# Patient Record
Sex: Female | Born: 1945 | Race: White | Hispanic: No | Marital: Single | Smoking: Never smoker
Health system: Southern US, Community
[De-identification: ages and names within clinical notes are randomized; demographics above are authoritative.]

## PROBLEM LIST (undated history)

## (undated) DIAGNOSIS — F209 Schizophrenia, unspecified: Secondary | ICD-10-CM

## (undated) DIAGNOSIS — F0280 Dementia in other diseases classified elsewhere without behavioral disturbance: Secondary | ICD-10-CM

## (undated) DIAGNOSIS — B1920 Unspecified viral hepatitis C without hepatic coma: Secondary | ICD-10-CM

## (undated) DIAGNOSIS — K219 Gastro-esophageal reflux disease without esophagitis: Secondary | ICD-10-CM

## (undated) DIAGNOSIS — T8859XA Other complications of anesthesia, initial encounter: Secondary | ICD-10-CM

## (undated) DIAGNOSIS — F05 Delirium due to known physiological condition: Secondary | ICD-10-CM

## (undated) DIAGNOSIS — K759 Inflammatory liver disease, unspecified: Secondary | ICD-10-CM

## (undated) HISTORY — DX: Inflammatory liver disease, unspecified: K75.9

## (undated) HISTORY — PX: ELBOW SURGERY: SHX618

## (undated) HISTORY — DX: Schizophrenia, unspecified: F20.9

## (undated) HISTORY — DX: Dementia in other diseases classified elsewhere, unspecified severity, without behavioral disturbance, psychotic disturbance, mood disturbance, and anxiety: F02.80

## (undated) SURGERY — Surgical Case
Anesthesia: *Unknown

---

## 1999-11-01 DIAGNOSIS — K648 Other hemorrhoids: Secondary | ICD-10-CM | POA: Insufficient documentation

## 2006-04-24 ENCOUNTER — Ambulatory Visit (HOSPITAL_COMMUNITY): Admission: RE | Admit: 2006-04-24 | Discharge: 2006-04-24 | Payer: Self-pay | Admitting: Internal Medicine

## 2006-09-20 ENCOUNTER — Ambulatory Visit: Payer: Self-pay | Admitting: Internal Medicine

## 2006-10-06 ENCOUNTER — Ambulatory Visit: Payer: Self-pay | Admitting: Internal Medicine

## 2007-03-23 ENCOUNTER — Ambulatory Visit: Payer: Self-pay | Admitting: Internal Medicine

## 2007-05-09 ENCOUNTER — Encounter (INDEPENDENT_AMBULATORY_CARE_PROVIDER_SITE_OTHER): Payer: Self-pay | Admitting: *Deleted

## 2007-05-11 DIAGNOSIS — G4700 Insomnia, unspecified: Secondary | ICD-10-CM | POA: Insufficient documentation

## 2007-05-11 DIAGNOSIS — F4321 Adjustment disorder with depressed mood: Secondary | ICD-10-CM | POA: Insufficient documentation

## 2007-05-11 DIAGNOSIS — E162 Hypoglycemia, unspecified: Secondary | ICD-10-CM | POA: Insufficient documentation

## 2008-01-21 ENCOUNTER — Emergency Department (HOSPITAL_COMMUNITY): Admission: EM | Admit: 2008-01-21 | Discharge: 2008-01-21 | Payer: Self-pay | Admitting: Emergency Medicine

## 2008-01-21 ENCOUNTER — Telehealth (INDEPENDENT_AMBULATORY_CARE_PROVIDER_SITE_OTHER): Payer: Self-pay | Admitting: Internal Medicine

## 2008-02-01 ENCOUNTER — Ambulatory Visit: Payer: Self-pay | Admitting: Internal Medicine

## 2008-02-01 DIAGNOSIS — R233 Spontaneous ecchymoses: Secondary | ICD-10-CM

## 2008-02-01 DIAGNOSIS — R609 Edema, unspecified: Secondary | ICD-10-CM | POA: Insufficient documentation

## 2008-02-01 LAB — CONVERTED CEMR LAB
Bilirubin Urine: NEGATIVE
Blood in Urine, dipstick: NEGATIVE
Glucose, Urine, Semiquant: NEGATIVE
Ketones, urine, test strip: NEGATIVE
Nitrite: NEGATIVE
Protein, U semiquant: NEGATIVE
Specific Gravity, Urine: <1.005
Urobilinogen, UA: 0.2
WBC Urine, dipstick: NEGATIVE
pH: 7

## 2008-02-08 LAB — CONVERTED CEMR LAB
ALT: 39 units/L — ABNORMAL HIGH (ref 0–35)
ANA Titer 1: 1:40 {titer} — ABNORMAL HIGH
AST: 34 units/L (ref 0–37)
Albumin: 4 g/dL (ref 3.5–5.2)
Alkaline Phosphatase: 108 units/L (ref 39–117)
Anti Nuclear Antibody(ANA): POSITIVE — AB
BUN: 12 mg/dL (ref 6–23)
CO2: 28 meq/L (ref 19–32)
CRP: 0.3 mg/dL (ref <0.6)
Calcium: 9 mg/dL (ref 8.4–10.5)
Chloride: 105 meq/L (ref 96–112)
Creatinine, Ser: 0.63 mg/dL (ref 0.40–1.20)
Glucose, Bld: 88 mg/dL (ref 70–99)
HCV Quantitative: 263000 intl units/mL — ABNORMAL HIGH (ref <43)
Potassium: 4.2 meq/L (ref 3.5–5.3)
Sodium: 141 meq/L (ref 135–145)
Total Bilirubin: 0.6 mg/dL (ref 0.3–1.2)
Total Protein: 7.1 g/dL (ref 6.0–8.3)

## 2008-02-12 ENCOUNTER — Encounter (INDEPENDENT_AMBULATORY_CARE_PROVIDER_SITE_OTHER): Payer: Self-pay | Admitting: Internal Medicine

## 2008-02-12 LAB — CONVERTED CEMR LAB
Basophils Absolute: 0 10*3/uL (ref 0.0–0.1)
Basophils Relative: 0 % (ref 0–1)
Eosinophils Relative: 2 % (ref 0–5)
MCHC: 32.2 g/dL (ref 30.0–36.0)
Monocytes Relative: 7 % (ref 3–12)
Neutro Abs: 4 10*3/uL (ref 1.7–7.7)
Platelets: 169 10*3/uL (ref 150–400)
Prothrombin Time: 13.6 s (ref 11.6–15.2)
RBC: 4.52 M/uL (ref 3.87–5.11)
RDW: 15 % (ref 11.5–15.5)
Sed Rate: 12 mm/hr (ref 0–22)
aPTT: 45 s — ABNORMAL HIGH (ref 24–37)

## 2008-02-14 ENCOUNTER — Ambulatory Visit: Payer: Self-pay | Admitting: Internal Medicine

## 2008-02-14 DIAGNOSIS — B182 Chronic viral hepatitis C: Secondary | ICD-10-CM | POA: Insufficient documentation

## 2008-02-14 DIAGNOSIS — R799 Abnormal finding of blood chemistry, unspecified: Secondary | ICD-10-CM

## 2008-02-28 ENCOUNTER — Ambulatory Visit: Payer: Self-pay | Admitting: Internal Medicine

## 2008-03-10 ENCOUNTER — Encounter (INDEPENDENT_AMBULATORY_CARE_PROVIDER_SITE_OTHER): Payer: Self-pay | Admitting: Internal Medicine

## 2008-03-20 LAB — CONVERTED CEMR LAB
HCV Quantitative: 369000 intl units/mL — ABNORMAL HIGH (ref <43)
ds DNA Ab: <1 (ref <5)

## 2008-03-25 ENCOUNTER — Telehealth (INDEPENDENT_AMBULATORY_CARE_PROVIDER_SITE_OTHER): Payer: Self-pay | Admitting: Internal Medicine

## 2008-04-30 ENCOUNTER — Encounter (INDEPENDENT_AMBULATORY_CARE_PROVIDER_SITE_OTHER): Payer: Self-pay | Admitting: *Deleted

## 2008-08-13 ENCOUNTER — Emergency Department (HOSPITAL_COMMUNITY): Admission: EM | Admit: 2008-08-13 | Discharge: 2008-08-13 | Payer: Self-pay | Admitting: Emergency Medicine

## 2008-08-18 ENCOUNTER — Inpatient Hospital Stay (HOSPITAL_COMMUNITY): Admission: AD | Admit: 2008-08-18 | Discharge: 2008-08-19 | Payer: Self-pay | Admitting: Orthopedic Surgery

## 2009-03-19 ENCOUNTER — Telehealth (INDEPENDENT_AMBULATORY_CARE_PROVIDER_SITE_OTHER): Payer: Self-pay | Admitting: Internal Medicine

## 2009-03-19 ENCOUNTER — Ambulatory Visit: Payer: Self-pay | Admitting: Internal Medicine

## 2009-04-03 ENCOUNTER — Ambulatory Visit: Payer: Self-pay | Admitting: Internal Medicine

## 2009-04-17 ENCOUNTER — Ambulatory Visit: Payer: Self-pay | Admitting: Internal Medicine

## 2009-04-20 ENCOUNTER — Ambulatory Visit: Payer: Self-pay | Admitting: Internal Medicine

## 2009-05-06 ENCOUNTER — Ambulatory Visit: Payer: Self-pay | Admitting: Internal Medicine

## 2009-05-18 ENCOUNTER — Ambulatory Visit: Payer: Self-pay | Admitting: Internal Medicine

## 2009-06-23 ENCOUNTER — Encounter (INDEPENDENT_AMBULATORY_CARE_PROVIDER_SITE_OTHER): Payer: Self-pay | Admitting: Internal Medicine

## 2009-11-10 ENCOUNTER — Telehealth (INDEPENDENT_AMBULATORY_CARE_PROVIDER_SITE_OTHER): Payer: Self-pay | Admitting: *Deleted

## 2009-11-10 ENCOUNTER — Ambulatory Visit: Payer: Self-pay | Admitting: Internal Medicine

## 2009-11-10 DIAGNOSIS — R635 Abnormal weight gain: Secondary | ICD-10-CM

## 2009-11-10 DIAGNOSIS — F329 Major depressive disorder, single episode, unspecified: Secondary | ICD-10-CM | POA: Insufficient documentation

## 2009-11-16 LAB — CONVERTED CEMR LAB
AFP-Tumor Marker: 11.5 ng/mL — ABNORMAL HIGH (ref 0.0–8.0)
BUN: 17 mg/dL (ref 6–23)
Cholesterol: 161 mg/dL (ref 0–200)
Creatinine, Ser: 0.65 mg/dL (ref 0.40–1.20)
HDL: 59 mg/dL (ref >39)
LDL Cholesterol: 83 mg/dL (ref 0–99)
Potassium: 3.7 meq/L (ref 3.5–5.3)
Triglycerides: 97 mg/dL (ref <150)
VLDL: 19 mg/dL (ref 0–40)

## 2009-11-20 ENCOUNTER — Ambulatory Visit (HOSPITAL_COMMUNITY): Admission: RE | Admit: 2009-11-20 | Discharge: 2009-11-20 | Payer: Self-pay | Admitting: Internal Medicine

## 2009-11-23 ENCOUNTER — Encounter (INDEPENDENT_AMBULATORY_CARE_PROVIDER_SITE_OTHER): Payer: Self-pay | Admitting: Internal Medicine

## 2009-12-02 ENCOUNTER — Encounter (INDEPENDENT_AMBULATORY_CARE_PROVIDER_SITE_OTHER): Payer: Self-pay | Admitting: *Deleted

## 2009-12-05 DIAGNOSIS — K8020 Calculus of gallbladder without cholecystitis without obstruction: Secondary | ICD-10-CM | POA: Insufficient documentation

## 2009-12-07 ENCOUNTER — Encounter (INDEPENDENT_AMBULATORY_CARE_PROVIDER_SITE_OTHER): Payer: Self-pay | Admitting: Internal Medicine

## 2010-09-21 NOTE — Letter (Signed)
Summary: RECEIVED RECORDS FROM [facility]  RECEIVED RECORDS FROM [facility]   Imported By: Silvio Pate Stanislawscyk 01/21/2010 12:13:07  _____________________________________________________________________  External Attachment:    Type:   Image     Comment:   External Document

## 2010-09-21 NOTE — Letter (Signed)
Summary: *HSN Results Follow up  [facility]  [address] [place], [place] [address]   Phone: [phone]  Fax: [phone]      12/02/2009   Elizabeth Tucker [address] [place], [place]  [address]   Dear  [name],                            ____S.Drinkard,FNP   ____D. Gore,FNP       ____B. McPherson,MD   ____V. Rankins,MD    __xx__E. Mulberry,MD    ____N. Daphine Deutscher, FNP  ____D. Reche Dixon, MD    ____K. Philipp Deputy, MD    ____Other     This letter is to inform you that your recent test(s):  _______Pap Smear    _______Lab Test     ____xx___X-ray    ___xx____ is within acceptable limits  _______ requires a medication change  _______ requires a follow-up lab visit  _______ requires a follow-up visit with your provider   Comments:  Please call and let us know when your last colonoscopy was.  Thank you.       _________________________________________________________ If you have any questions, please contact our office                     Sincerely,  Vesta Mixer CMA [facility]

## 2010-09-21 NOTE — Letter (Signed)
Summary: *Referral Letter  [facility]  [address] [place], [place] [address]   Phone: [phone]  Fax: [phone]    11/10/2009  Thank you in advance for agreeing to see my patient:  Elizabeth Tucker [address] 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  Underwent needle liver biopsy 07/18/00 with findings of chronic hepatitis with moderate activity.  Will send copy of pathology.  Initiated on pegylated interferon and ribavirin 10/23/2000.  Dose decreased from what I can tell secondary to some depression and nausea and vomiting on 01/21/2001.  Pt. was on Paxil at the time.  Her viral load was undetectable at 12 weeks.  Treatment stopped end of 01/2001 as pt. lost her insurance and could not afford to come in for regular follow up/labs.  After treatment, pt. recognized how much better she felt off treatment. When pt. established here, she was under the impression that she had received full treatment and the treatment was stopped secondary to her undetectable viral level.  I was unable to sway her from this belief until recently receiving her old records.  HCV viral load last checked 02/28/08 with 369,000 copies. HIV at that time also negative. Interestingly, her GI records show she also has a hx of gastric metaplasia noted on EGD just before the Hep C diagnosis.  I will set her up with GI for that as well.   AFP, FLP, BMET pending from today.  I did not get her Hep A and B status today either--will check back with her on that. She will need psychiatric evaluation if previous treatment recommended.  I am trying to contact her counselor to see if she has a particular psychiatric diagnosis currently--she is taking Seroquel, but much of this from the patient's standpoint, is for sleep.  I was concerned for possible bipolar disorder, but pt. states her counselor does not feel she carries this  diagnosis.  Procedures Requested:   Current Medical Problems: 1)  INTERNAL HEMORRHOIDS (ICD-455.0) 2)  GASTRIC METAPLASIA (ICD-537.89) 3)  DEPRESSION (ICD-311) 4)  WEIGHT GAIN, ABNORMAL (ICD-783.1) 5)  HEPATITIS C, CHRONIC (ICD-070.54) 6)  ANA POSITIVE (ICD-790.99) 7)  ANKLE EDEMA (ICD-782.3) 8)  PETECHIAE (ICD-782.7) 9)  HYPOGLYCEMIA (ICD-251.2) 10)  MOURNING (ICD-309.0) 11)  INSOMNIA (ICD-780.52)   Current Medications: 1)  PRILOSEC OTC 20 MG TBEC (OMEPRAZOLE MAGNESIUM) one by mouth daily 2)  SEROQUEL 25 MG TABS (QUETIAPINE FUMARATE) 1 tab by mouth at bedtime 3)  SEROQUEL XR 50 MG XR24H-TAB (QUETIAPINE FUMARATE) 1 tab by mouth q hs   Past Medical History: 1)  Current Problems:  2)  HYPOGLYCEMIA (ICD-251.2) 3)  MOURNING (ICD-309.0) 4)  INSOMNIA (ICD-780.52) 5)  (daughter commited suicide 08/23/06)   Prior History of Blood Transfusions:   Pertinent Labs:    Thank you again for agreeing to see our patient; please contact us if you have any further questions or need additional information.  Sincerely,  Julieanne Manson MD

## 2010-09-21 NOTE — Letter (Signed)
Summary: MEDICAL  SPECIALTY //APPT TO BE SET UP  MEDICAL  SPECIALTY //APPT TO BE SET UP   Imported By: Arta Bruce 02/03/2010 11:28:01  _____________________________________________________________________  External Attachment:    Type:   Image     Comment:   External Document

## 2010-09-21 NOTE — Assessment & Plan Note (Signed)
Summary: follow up in two months//gk   Vital Signs:  Patient profile:   65 year old female Weight:      184.44 pounds BMI:     31.77 Temp:     98.1 degrees F Pulse rate:   91 / minute Pulse rhythm:   regular Resp:     16 per minute BP sitting:   122 / 78  (left arm) Cuff size:   regular  Vitals Entered By: Chauncy Passy CC: Pt. is here for a 2 month f/u (Bi-Polar). Pt. states she went to a psychiatrist @ [facility] on [address] and per the psychiatrist, pt. is not bi-polar. Pt. is taking a sleeping pill.  Is Patient Diabetic? No Pain Assessment Patient in pain? no       Does patient need assistance? Ambulation Normal   CC:  Pt. is here for a 2 month f/u (Bi-Polar). Pt. states she went to a psychiatrist @ [facility] on [address] and per the psychiatrist and pt. is not bi-polar. Pt. is taking a sleeping pill. Marland Kitchen  History of Present Illness: 1.  Psych:  pt. states she saw Aquilla Solian x 3.  She states Marchelle Folks did not feel she had bipolar disorder.  Pt. is taking 1/2 of Seroquel 25 mg and feels great--sleeping well, well rested, lot of energy.  Feels great.  Looks forward to her day.  Laid off June 21st last year.  Went back to work in November and laid off again in February.    Current Medications (verified): 1)  Prilosec Otc 20 Mg Tbec (Omeprazole Magnesium) .... One By Mouth Daily 2)  Seroquel 25 Mg Tabs (Quetiapine Fumarate) .Marland Kitchen.. 1 Tab By Mouth At Bedtime 3)  Seroquel Xr 50 Mg Xr24h-Tab (Quetiapine Fumarate) .Marland Kitchen.. 1 Tab By Mouth Q Hs  Allergies (verified): 1)  ! Sulfa  Past History:  Past Surgical History: 1. 08/14/08:  ORIF left elbow fracture:  Dr. Amanda Pea.  2.  1980s:  Bilateral Tubal Ligation 3.  1990:  Bilateral Salpingectomy and left oophorectomy for tuboovarian abscess with incidental Appendectomy   Family History: Mother, died 59   :  Alzheimer's dementia Father, died  26:  Lung cancer-smoker.  COPD, PUD 7 siblings:  Twin Brother:  also with Hep C, DM, COPD, cirrhosis of the liver,  alcoholic as well--very ill 2011.  All sisters(4) have DM.  Another brother with chronic back problems.  Youngest brother murdered AGE 41.   1 Daughter, age 49:  suicide.  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 Lives alone Divorced for many years. Moved to [place] in 2007.  Physical Exam  Lungs:  Normal respiratory effort, chest expands symmetrically. Lungs are clear to auscultation, no crackles or wheezes. Heart:  Normal rate and regular rhythm. S1 and S2 normal without gallop, murmur, click, rub or other extra sounds.  Radial pulses normal   Impression & Recommendations:  Problem # 1:  INSOMNIA (ICD-780.52) With concern for Bipolar disorder/depression. Will speak with Aquilla Solian regarding pt. Pt. doing well on Seroquel other than significant weight gain--20 lbs in past 7 months--actually only using 12.5 mg at bedtime. Not clear if she has a definite psychiatric diagnosis, but will need psychiatric evaluation before considering Hep C treatment.  Problem # 2:  HEPATITIS C, CHRONIC (ICD-070.54) Received records from Dr. Cipriano Bunker in [place], [place].  Pt. was initiated treatment with pegylated interferon and Ribavirin 10-28-00.  From the notes, it appears her peg-intron was decreased 01/23/01 as she had some concerns for depression  and interaction with her Paxil causing nausea and vomiting.  She did have an undetectable viral load after 3 months of treatment and then lost her insurance and was unable to continue with follow up office visits, so the treatment was stopped until she would be able to follow up adequately. Pt. willing to hear what Hep C clinic would do with treatment at this point now that she has a positive viral count currently.  Problem # 3:  WEIGHT GAIN, ABNORMAL (ICD-783.1) Pt. really feels she has done well with the Seroquel and does not want to stop--willing to work on lifestyle changes to see if she can get weight gain under control.   Discussed my concerns with her family hx of DM She is nonfasting today. Orders: T-Lipid Profile [id number]) T-Basic Metabolic Panel [id number])  Complete Medication List: 1)  Prilosec Otc 20 Mg Tbec (Omeprazole magnesium) .... One by mouth daily 2)  Seroquel 25 Mg Tabs (Quetiapine fumarate) .Marland Kitchen.. 1 tab by mouth at bedtime 3)  Seroquel Xr 50 Mg Xr24h-tab (Quetiapine fumarate) .Marland Kitchen.. 1 tab by mouth q hs  Patient Instructions: 1)  CPP next available--Dr. Delrae Alfred 2)  Call in 4 weeks if you have not heard from Dr. Delrae Alfred regarding Hep C clinic  Appended Document: follow up in two months//gk  Unable to get hold of anyone at Hep C clinic. Will go ahead and refer pt. to them with information.   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  Hx of transfusion after SAB when 65 yo. (11/10/2009 15:48) Added new observation of SMOK STATUS: quit > 6 months (11/10/2009 15:48)         Social History: Smoking Status:  quit > 6 months Drug Use:  Hx of smoking cocaine in her 28s for about 7 years--shared pipets with others who used IV drugs.  Hx of transfusion after SAB when 65 yo.    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  Hx of transfusion after SAB when 65 yo.  Appended Document: follow up in two months//gk  Will also need GI referral for Gastric Metaplasia   Clinical Lists Changes  Orders: Added new Referral order of Gastroenterology Referral (GI) - Signed

## 2010-09-21 NOTE — Progress Notes (Signed)
   Phone Note Outgoing Call   Call placed by: Julieanne Manson MD,  November 10, 2009 3:59 PM Summary of Call: Debra:  Hepatitis C Clinic referral.  pt. with history of treatment back in 10/2000-01/2001.  Referral note written.  Hep C labs under Hepatitis/HIV, along with HIV status from 2009.  Will need a separate referral to GI for gastric metaplasia diagnosis from EGD in 10/1999. For Hep C clinic, will need to send along liver biopsy from 11.2001 and [place], [place] notes.   For GI clinic, will need copy of EGD and biopsy path from 10/1999 These reports are currently not in the chart--will need to have Tiffany or Velna Hatchet fax to you Initial call taken by: Julieanne Manson MD,  November 10, 2009 4:00 PM  Follow-up for Phone Call        Can u get me these notes? Follow-up by: Candi Leash,  November 11, 2009 2:38 PM  Additional Follow-up for Phone Call Additional follow up Details #1::        Debra, I gave them to JM yesterday to give you. Additional Follow-up by: Vesta Mixer CMA,  November 11, 2009 2:43 PM

## 2010-09-21 NOTE — Miscellaneous (Signed)
Summary: Records from Dr. Leonard Downing, [place], [place].   Clinical Lists Changes  Problems: Changed problem from HEPATITIS C, CHRONIC (ICD-070.54) to HEPATITIS C, CHRONIC (ICD-070.54) - Dx:  2001.   Treated in [place], [place] with pegylated interferon and Ribavirin for 3 months.  Dose decreased secondary to GI side effects and depression--not suicidal.  Stopped as she lost her insurance and could not afford lab and office follow up.  Viral load was undetectable at time of stopping - Signed Added new problem of GASTRIC METAPLASIA (ICD-537.89) - EGD 11/09/1999 - Signed Added new problem of INTERNAL HEMORRHOIDS (ICD-455.0) - otherwise normal colonoscopy --follow up in 10 years. - Signed Observations: Added new observation of EGD: Erythematous nodularity of gastric antrum--biopsy showed gastric metaplasia--recommended every 2 year EGD to follow for gastric cancer screen.  Dr. Jerel Shepherd.  [place], [place] (11/09/1999 15:23) Added new observation of COLONOSCOPY: Internal hemorrhoids, otherwise normal--repeat in 10 years.  Dr. Bronson Ing, [place] (11/01/1999 15:23)

## 2010-09-21 NOTE — Letter (Signed)
Summary: *Referral Letter  [facility]  [address] [place], [place] [address]   Phone: [phone]  Fax: [phone]    11/10/2009  Thank you in advance for agreeing to see my patient:  Elizabeth Tucker [address] [place], [place]  [address]  Phone: [phone]  Reason for Referral: Follow up of Gastric Metaplasia.  Noted on EGD from 10/1999 performed in [place], [place].  Was recommended she have regular follow up EGD to visualize and biopsy with concern for transformation to gastric CA.  I do not believe she has had an EGD since.  Procedures Requested: EGD, follow up of gastric metaplasia  Current Medical Problems: 1)  INTERNAL HEMORRHOIDS (ICD-455.0) 2)  GASTRIC METAPLASIA (ICD-537.89) 3)  DEPRESSION (ICD-311) 4)  WEIGHT GAIN, ABNORMAL (ICD-783.1) 5)  HEPATITIS C, CHRONIC (ICD-070.54) 6)  ANA POSITIVE (ICD-790.99) 7)  ANKLE EDEMA (ICD-782.3) 8)  PETECHIAE (ICD-782.7) 9)  HYPOGLYCEMIA (ICD-251.2) 10)  MOURNING (ICD-309.0) 11)  INSOMNIA (ICD-780.52)   Current Medications: 1)  PRILOSEC OTC 20 MG TBEC (OMEPRAZOLE MAGNESIUM) one by mouth daily 2)  SEROQUEL 25 MG TABS (QUETIAPINE FUMARATE) 1 tab by mouth at bedtime 3)  SEROQUEL XR 50 MG XR24H-TAB (QUETIAPINE FUMARATE) 1 tab by mouth q hs   Past Medical History: 1)  Current Problems:  2)  HYPOGLYCEMIA (ICD-251.2) 3)  MOURNING (ICD-309.0) 4)  INSOMNIA (ICD-780.52) 5)  (daughter commited suicide 08/23/06)   Copies of EGD and biopsy path to be sent as well.  Thank you again for agreeing to see our patient; please contact us if you have any further questions or need additional information.  Sincerely,  Julieanne Manson MD

## 2011-01-04 NOTE — Op Note (Signed)
Elizabeth Tucker, WASSER NO.:  [id number]   MEDICAL RECORD NO.:  [id number]          PATIENT TYPE:  OIB   LOCATION:  5001                         FACILITY:  [facility]   PHYSICIAN:  Dionne Ano. Gramig, M.D.DATE OF BIRTH:  [birth year]   DATE OF PROCEDURE:  DATE OF DISCHARGE:                               OPERATIVE REPORT   PREOPERATIVE DIAGNOSIS:  Comminuted complex interarticular T-condylar  distal left humerus fracture.   POSTOPERATIVE DIAGNOSIS:  Comminuted complex interarticular T-condylar  distal left humerus fracture.   PROCEDURE:  1. Open reduction and internal fixation of comminuted complex T-      condylar fracture with ALPS plating system.  2. Cubital tunnel release and anterior ulnar nerve transposition, left      elbow.  3. Olecranon osteotomy with subsequent fixation, left elbow.  4. Stress radiography, left elbow.   SURGEON:  Dionne Ano. Amanda Pea, MD   ASSISTANT:  Karie Chimera, PA-C   COMPLICATIONS:  None.   DRAINS:  One.   INDICATIONS FOR PROCEDURE:  This patient 65 year old female who was  injured on the job, sustained a fracture-dislocation of her elbow.  I  was asked to see her by General Orthopedics and the emergency room  staff.  She was triaged and set for surgery.  She understands risks,  benefits, bleeding, infection, anesthesia, damage to normal structures,  and failure of surgery to accomplish its intended goals of relieving  symptoms and restoring function.  With this in mind, she desires to  proceed.  All questions have been encouraged and answered.  Nerve  injury, bleeding, infection, and other issues including posttraumatic  arthritis were discussed at great length.  With all issues in mind, she  desired to proceed.   OPERATION IN DETAIL:  The patient was administered anesthesia and taken  to the operative suite.  Permit was signed.  Arm was marked.  A time-out  was called.  She was given a general anesthetic.  She was then placed  in  a sloppy lateral position with axillary roll and secured with body  parts.  Once this was done, the patient then underwent a prep and drape  to the left upper extremity.  Once this done, a sterile tourniquet was  applied.  Tourniquet was inflated to 250 mmHg and a posterior incision  was made under sterile field.  Dissection was carried down.  She had a  large amount of adipose tissue.  Retraction medially and laterally was  accomplished off the triceps fascia.  I then identified the ulnar nerve  and released the arcade of Struthers, medial intermuscular septum which  was excised to edge of the  FCU, cubital tunnel, and Osborne ligament.  She tolerated this well.  There were no complicating features.  Once  this was done, I then performed very careful and cautious placement of  the nerve with a vessel loop around it, being sure to mobilize the  epineurial plexus of vessels.  She tolerated this quite well and there  were no complicating features.   Once this was done, I then performed an olecranon osteotomy.  I  preplaced 2 locking screws for the ALPS plating system and a proximal  olecranon plate.  Once this was done, chevron osteotomy was created.  This was all done under fluoroscopy to make sure cuts and screws were in  perfect position.  I was pleased with the findings.  I then performed  placement of oscillating saw followed by osteotome in the far cortex and  completed the olecranon osteotomy.  This allowed the area to be folded  back.  Access to the distal humerus was accomplished.  Once this was  done, I irrigated it copiously, removed bloody clot and hematoma, and  then assembled with provisional fixation of the distal humerus.  The  patient had a low T-condylar fracture with involvement of the trochlea,  capitellum, and distal humerus of course.  This was provisionally  fixated with Kirschner wires.  Following this, medial and lateral plates  for the ALPS system was applied.   I placed OrthoBlast bone graft from  [employer] in the defect and then closed this with bony shard which was  present.  I was quite pleased with the reduction and once this was done,  I applied the plates according to standard AO technique.  I was able to  achieve a good compression.  I did perform a combination of locking and  nonlocking screws to make sure that the reduction was excellent.  I was  pleased with the findings.  The reduction was excellent and looked very  well.  Screw lengths were in good position as checked under fluoroscopy.  Once this was done, I irrigated it copiously and made sure the nerve was  stable and transposed, it was.  I then fixed the olecranon osteotomy  with plate and screw construct from the ALPS system in standard AO  technique.  Once this was done, the patient was taken through a range of  motion.  She had a smooth arc of motion.  No locking, popping, or  catching.  No signs of hardware protuberance or other complicating  features.  I was pleased with the findings.  Permanent x-rays were taken  for documentation, and the patient then had the nerve secured in a  transposed position.  Fascia was placed over the plate medially, so that  the nerve would not sublux or wear against the plate.  The patient had  an excellent looking skin and soft tissue contours at this point.  I  deflated the tourniquet at less than 2 hours, placed a drain, and closed  the wound in layers with 2 and 0 Vicryl in the deep tissue about the  fascia followed by 3-0 Vicryl in the subcu and staple clips at skin  edge.  The patient tolerated this well.  She was placed in a long-arm  splint in a functional position and was transferred to the recovery room  in stable condition.   She will be needed for IV antibiotics, general postop observation, and  other measures.  I have discussed with the patient the do's, donot's,  etc, and all plans postoperatively.  It has been an absolute pleasure  to  see and treat her and we look forward to participating in her postop  progress.  We want to go ahead and begin an early interval range of  motion and keep her out of work at this juncture.      Dionne Ano. Amanda Pea, M.D.  Electronically Signed     WMG/MEDQ  D:  08/16/2008  T:  08/17/2008  Job:  [id number] 

## 2011-01-04 NOTE — Consult Note (Signed)
NAME:  JYLL, TOMARO NO.:  [id number]   MEDICAL RECORD NO.:  [id number]          PATIENT TYPE:  INP   LOCATION:  0102                         FACILITY:  [facility]   PHYSICIAN:  Dionne Ano. Gramig, M.D.DATE OF BIRTH:  Jan 21, 1946   DATE OF CONSULTATION:  08/13/2008  DATE OF DISCHARGE:                                 CONSULTATION   I had placed Elizabeth Tucker in the [facility] system at 4 a.m.  August 13, 2008.  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  She was  seen and present with pain.  My partner, Dr. Beverely Low, was called by  the general orthopedics and saw her.  He asked me to see her due to the  complexity of her upper extremity injury.  I have been requested to take  over care in regards to her predicament, given the severity and  comminution.  I have discussed these issues with Elizabeth Tucker at 4 a.m.  I have evaluated her at length.  She is a very pleasant female.  She is  65 years of age, right hand dominant, slightly obese and painful in her  elbow.  She denies other injury.   ALLERGIES:  SULFA.   MEDICATIONS:  OTC Prilosec.   PAST MEDICAL HISTORY:  Acid reflux.   PAST SURGICAL HISTORY:  Bilateral tubal ligation with subsequent  infection developing requiring reoperation months later.  She also has a  history of C-section.   SOCIAL HISTORY:  She is nonsmoker.  She works at [employer]. 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   PHYSICAL EXAMINATION:  CONSTITUTIONAL:  Reveals a white female, alert  and oriented, in no acute distress.  VITAL SIGNS:  Stable.  EXTREMITIES:  She has swelling over her left elbow.  She is  neurovascularly intact.  She has normal pulse.  No signs of compartment  syndrome, dystrophy, infection.  There is no skin abrasion over open  fracture.  I have reviewed these  findings.  She has excellent refill to  the fingers and a normal pulse.  Shoulder and wrist are stable.  The  right upper extremity, which is her dominant hand, is neurovascularly  intact.  Normal alignment.  Good range of motion.  The lower extremity  examination is benign.  CHEST AND ABDOMEN:  Nontender.  She is obese in her abdomen, nontender,  and has soft throughout all quadrants.   X-RAYS:  Show a highly comminuted complex distal humerus fracture with  displacement.  The patient does not have any gross focal ulna or humeral  dislocation.  This is a low T condylar fracture, highly comminuted, and  complex.  We have reviewed the plain films and her diagnostics at  length.  I have discussed with her these issues involving the fracture.   IMPRESSION:  Closed, highly-comminuted distal humerus fracture in a 35-  year-old status post fall in the job.   PLAN:  I have discussed with her findings present time, placed in a  splint, and molded the arm comfortably.  I discussed with her obtaining  CT scan with 3-D reconstruction for preop evaluation purposes.  We are  going to go ahead and proceed with this and make plans for definitive  surgical care.  I have discussed with her that given the very low  nature, if she was an elderly woman, would consider a total elbow  arthroplasty.  However, given her relatively young age of 30 and a  security guard as an occupation, I would attempt ORIF understanding and  accepting the risks and benefits surgery including risk of infection,  bleeding, anesthesia, damage to normal structures and failure of surgery  to accomplish its intended goals of relieving symptoms and restoring  function.  With this in mind, we are going to proceed accordingly.  I  have discussed with the patient the relevant do's and do not's as well  as possible need for further surgery arthroplasty and risk of infection,  et Karie Soda.   I am going to discharge her on Percocet 5 mg 1  to 2 q.4 to 6 hours  p.r.n. pain, p.o. Robaxin 5 mg 1 p.o. q.6 to 8 hours p.r.n. spasm, Peri-  Colace, vitamin C, and we will set her up for surgery in the days to  follow.  We will set her up an elective time given the fact that she has  eaten and this is a closed injury, feel that this would be best served  to be placed on a semi-urgent basis and get her CT scan and diagnostics  performed prior to her definitive fixation.  We have discussed these  issues at length the do's and do not's et Karie Soda, and all questions have  been encouraged and answered.  I am going to have her out of work until  further notice.  This was a worker's compensation injury.      Dionne Ano. Amanda Pea, M.D.  Electronically Signed     WMG/MEDQ  D:  08/13/2008  T:  08/13/2008  Job:  [id number]

## 2011-05-19 LAB — DIFFERENTIAL
Basophils Absolute: 0
Basophils Relative: 0
Eosinophils Absolute: 0.1
Eosinophils Relative: 2
Lymphocytes Relative: 27
Lymphs Abs: 2.1
Monocytes Absolute: 0.5
Monocytes Relative: 6
Neutro Abs: 5
Neutrophils Relative %: 65

## 2011-05-19 LAB — COMPREHENSIVE METABOLIC PANEL
ALT: 48 — ABNORMAL HIGH
AST: 47 — ABNORMAL HIGH
Albumin: 3.5
BUN: 9
CO2: 27
Creatinine, Ser: 0.65
Potassium: 3.7

## 2011-05-19 LAB — COMPREHENSIVE METABOLIC PANEL WITH GFR
Alkaline Phosphatase: 100
Calcium: 8.9
Chloride: 105
GFR calc Af Amer: >60
GFR calc non Af Amer: >60
Glucose, Bld: 91
Sodium: 140
Total Bilirubin: 0.9
Total Protein: 6.6

## 2011-05-19 LAB — URINALYSIS, ROUTINE W REFLEX MICROSCOPIC
Bilirubin Urine: NEGATIVE
Glucose, UA: NEGATIVE
Hgb urine dipstick: NEGATIVE
Ketones, ur: NEGATIVE
Nitrite: NEGATIVE
Protein, ur: NEGATIVE
Specific Gravity, Urine: 1.021
Urobilinogen, UA: 0.2
pH: 5.5

## 2011-05-19 LAB — ANTI-NUCLEAR AB-TITER (ANA TITER): ANA Titer 1: 1:40 {titer} — ABNORMAL HIGH

## 2011-05-19 LAB — URINE MICROSCOPIC-ADD ON

## 2011-05-19 LAB — CBC
HCT: 40.3
Hemoglobin: 13.6
MCHC: 33.8
MCV: 88.5
Platelets: 170
RBC: 4.55
RDW: 13.3
WBC: 7.7

## 2011-05-19 LAB — SEDIMENTATION RATE: Sed Rate: 12

## 2011-05-19 LAB — ANA: Anti Nuclear Antibody(ANA): POSITIVE — AB

## 2011-05-27 LAB — BASIC METABOLIC PANEL
CO2: 26 mEq/L (ref 19–32)
Chloride: 102 mEq/L (ref 96–112)
Creatinine, Ser: 0.76 mg/dL (ref 0.4–1.2)
GFR calc Af Amer: >60 mL/min (ref >60)
Glucose, Bld: 101 mg/dL — ABNORMAL HIGH (ref 70–99)
Potassium: 3.2 mEq/L — ABNORMAL LOW (ref 3.5–5.1)

## 2011-05-27 LAB — POCT I-STAT, CHEM 8
Calcium, Ion: 1.19 mmol/L (ref 1.12–1.32)
Creatinine, Ser: 0.8 mg/dL (ref 0.4–1.2)
Glucose, Bld: 112 mg/dL — ABNORMAL HIGH (ref 70–99)
Potassium: 3.5 mEq/L (ref 3.5–5.1)
Sodium: 140 mEq/L (ref 135–145)
TCO2: 24 mmol/L (ref 0–100)

## 2011-05-27 LAB — DIFFERENTIAL
Eosinophils Absolute: 0 10*3/uL (ref 0.0–0.7)
Eosinophils Relative: 0 % (ref 0–5)
Lymphocytes Relative: 9 % — ABNORMAL LOW (ref 12–46)
Monocytes Absolute: 0.3 10*3/uL (ref 0.1–1.0)
Neutro Abs: 8.8 10*3/uL — ABNORMAL HIGH (ref 1.7–7.7)
Neutrophils Relative %: 87 % — ABNORMAL HIGH (ref 43–77)

## 2011-05-27 LAB — URINALYSIS, ROUTINE W REFLEX MICROSCOPIC
Glucose, UA: NEGATIVE mg/dL
Hgb urine dipstick: NEGATIVE
Ketones, ur: NEGATIVE mg/dL
Nitrite: NEGATIVE
Protein, ur: NEGATIVE mg/dL
Specific Gravity, Urine: 1.03 (ref 1.005–1.030)
Urobilinogen, UA: 1 mg/dL (ref 0.0–1.0)
pH: 6 (ref 5.0–8.0)

## 2011-05-27 LAB — CBC
HCT: 44 % (ref 36.0–46.0)
Hemoglobin: 13.9 g/dL (ref 12.0–15.0)
MCV: 89.8 fL (ref 78.0–100.0)
RBC: 4.86 MIL/uL (ref 3.87–5.11)
RBC: 4.64 MIL/uL (ref 3.87–5.11)
RDW: 14.3 % (ref 11.5–15.5)

## 2011-05-27 LAB — URINE MICROSCOPIC-ADD ON

## 2013-10-29 ENCOUNTER — Ambulatory Visit
Admission: RE | Admit: 2013-10-29 | Discharge: 2013-10-29 | Disposition: A | Discharge status: Home / Self Care | Payer: Medicare Other | Source: Ambulatory Visit | Attending: Nurse Practitioner | Admitting: Nurse Practitioner

## 2013-10-29 ENCOUNTER — Other Ambulatory Visit: Payer: Self-pay | Admitting: Nurse Practitioner

## 2013-10-29 DIAGNOSIS — M545 Low back pain, unspecified: Secondary | ICD-10-CM

## 2013-10-29 DIAGNOSIS — M25551 Pain in right hip: Secondary | ICD-10-CM

## 2013-10-29 DIAGNOSIS — M25552 Pain in left hip: Secondary | ICD-10-CM

## 2013-11-15 ENCOUNTER — Ambulatory Visit: Payer: Self-pay | Admitting: Internal Medicine

## 2013-11-26 ENCOUNTER — Other Ambulatory Visit: Payer: Self-pay | Admitting: Nurse Practitioner

## 2013-11-26 DIAGNOSIS — Z1231 Encounter for screening mammogram for malignant neoplasm of breast: Secondary | ICD-10-CM

## 2013-12-17 ENCOUNTER — Ambulatory Visit
Admission: RE | Admit: 2013-12-17 | Discharge: 2013-12-17 | Disposition: A | Discharge status: Home / Self Care | Payer: Medicare Other | Source: Ambulatory Visit | Attending: Nurse Practitioner | Admitting: Nurse Practitioner

## 2013-12-17 ENCOUNTER — Encounter (INDEPENDENT_AMBULATORY_CARE_PROVIDER_SITE_OTHER): Payer: Self-pay

## 2013-12-17 DIAGNOSIS — Z1231 Encounter for screening mammogram for malignant neoplasm of breast: Secondary | ICD-10-CM

## 2015-05-28 DIAGNOSIS — M1990 Unspecified osteoarthritis, unspecified site: Secondary | ICD-10-CM | POA: Diagnosis not present

## 2015-05-28 DIAGNOSIS — K219 Gastro-esophageal reflux disease without esophagitis: Secondary | ICD-10-CM | POA: Diagnosis not present

## 2015-05-28 DIAGNOSIS — B1920 Unspecified viral hepatitis C without hepatic coma: Secondary | ICD-10-CM | POA: Diagnosis not present

## 2015-05-28 DIAGNOSIS — Z79899 Other long term (current) drug therapy: Secondary | ICD-10-CM | POA: Diagnosis not present

## 2015-08-05 ENCOUNTER — Other Ambulatory Visit (HOSPITAL_COMMUNITY): Payer: Self-pay | Admitting: Nurse Practitioner

## 2015-08-05 DIAGNOSIS — B182 Chronic viral hepatitis C: Secondary | ICD-10-CM | POA: Diagnosis not present

## 2015-10-02 DIAGNOSIS — B1920 Unspecified viral hepatitis C without hepatic coma: Secondary | ICD-10-CM | POA: Diagnosis not present

## 2015-10-02 DIAGNOSIS — M1990 Unspecified osteoarthritis, unspecified site: Secondary | ICD-10-CM | POA: Diagnosis not present

## 2015-10-02 DIAGNOSIS — R252 Cramp and spasm: Secondary | ICD-10-CM | POA: Diagnosis not present

## 2015-10-02 DIAGNOSIS — R197 Diarrhea, unspecified: Secondary | ICD-10-CM | POA: Diagnosis not present

## 2015-10-02 DIAGNOSIS — K649 Unspecified hemorrhoids: Secondary | ICD-10-CM | POA: Diagnosis not present

## 2015-11-05 ENCOUNTER — Encounter (HOSPITAL_COMMUNITY): Payer: Self-pay | Admitting: Emergency Medicine

## 2015-11-05 DIAGNOSIS — Z79899 Other long term (current) drug therapy: Secondary | ICD-10-CM | POA: Diagnosis not present

## 2015-11-05 DIAGNOSIS — K219 Gastro-esophageal reflux disease without esophagitis: Secondary | ICD-10-CM | POA: Diagnosis not present

## 2015-11-05 DIAGNOSIS — Z7982 Long term (current) use of aspirin: Secondary | ICD-10-CM | POA: Diagnosis not present

## 2015-11-05 DIAGNOSIS — Z8619 Personal history of other infectious and parasitic diseases: Secondary | ICD-10-CM | POA: Diagnosis not present

## 2015-11-05 DIAGNOSIS — Z87828 Personal history of other (healed) physical injury and trauma: Secondary | ICD-10-CM | POA: Diagnosis not present

## 2015-11-05 DIAGNOSIS — M5442 Lumbago with sciatica, left side: Secondary | ICD-10-CM | POA: Insufficient documentation

## 2015-11-05 DIAGNOSIS — R159 Full incontinence of feces: Secondary | ICD-10-CM | POA: Insufficient documentation

## 2015-11-05 DIAGNOSIS — M545 Low back pain: Secondary | ICD-10-CM | POA: Diagnosis present

## 2015-11-05 DIAGNOSIS — M5126 Other intervertebral disc displacement, lumbar region: Secondary | ICD-10-CM | POA: Diagnosis not present

## 2015-11-05 MED ORDER — OXYCODONE-ACETAMINOPHEN 5-325 MG PO TABS
ORAL_TABLET | ORAL | Status: AC
  Filled 2015-11-05: qty 1

## 2015-11-05 MED ORDER — OXYCODONE-ACETAMINOPHEN 5-325 MG PO TABS
1.0000 | ORAL_TABLET | Freq: Once | ORAL | Status: AC
  Administered 2015-11-05: 1 via ORAL

## 2015-11-05 NOTE — ED Notes (Signed)
Pt. reports left back pain worse with movement onset 2 days ago , pt. stated she fell on her left side last month on ice  , pain increases with movement , she concerned about contracting pneumonia due to cold exposure while at work this week . Denies fever / no SOB .

## 2015-11-06 ENCOUNTER — Encounter (HOSPITAL_COMMUNITY): Payer: Self-pay | Admitting: Emergency Medicine

## 2015-11-06 ENCOUNTER — Emergency Department (HOSPITAL_COMMUNITY)
Admission: EM | Admit: 2015-11-06 | Discharge: 2015-11-06 | Disposition: A | Discharge status: Home / Self Care | Payer: Commercial Managed Care - HMO | Attending: Emergency Medicine | Admitting: Emergency Medicine

## 2015-11-06 DIAGNOSIS — M5126 Other intervertebral disc displacement, lumbar region: Secondary | ICD-10-CM

## 2015-11-06 DIAGNOSIS — M5442 Lumbago with sciatica, left side: Secondary | ICD-10-CM

## 2015-11-06 HISTORY — DX: Unspecified viral hepatitis C without hepatic coma: B19.20

## 2015-11-06 HISTORY — DX: Gastro-esophageal reflux disease without esophagitis: K21.9

## 2015-11-06 MED ORDER — NAPROXEN 500 MG PO TABS: 500.0000 mg | ORAL_TABLET | Freq: Two times a day (BID) | ORAL | Status: DC

## 2015-11-06 MED ORDER — CYCLOBENZAPRINE HCL 10 MG PO TABS: 10.0000 mg | ORAL_TABLET | Freq: Two times a day (BID) | ORAL | Status: DC | PRN

## 2015-11-06 NOTE — ED Notes (Signed)
Patient is too impatient for recheck of vitals.

## 2015-11-06 NOTE — ED Notes (Signed)
Patient able to ambulate in room freely unassisted.

## 2015-11-06 NOTE — ED Provider Notes (Signed)
CSN: [id number]     Arrival date & time 11/05/15  2043 History   First MD Initiated Contact with Patient 11/06/15 [phone]     Chief Complaint  Patient presents with  . Back Pain     (Consider location/radiation/quality/duration/timing/severity/associated sxs/prior Treatment) Patient is a 70 y.o. female presenting with back pain.  Back Pain Location:  Lumbar spine Quality: throbbing, pulsating. Radiates to: left buttock. Onset quality:  Gradual Duration:  2 days Timing:  Constant Progression:  Partially resolved Fall: fell 1 month ago, no pain immediately after that.   Context comment:  Started new job, working at [employer], walking 4hr every morning as Engineer, materials, out in the cold and walking Relieved by:  Nothing Worsened by:  Ambulation Ineffective treatments:  None tried Associated symptoms: bowel incontinence (in setting of diarrhea, 2 times)   Associated symptoms: no abdominal pain, no bladder incontinence, no chest pain, no dysuria, no fever, no headaches, no numbness, no paresthesias, no perianal numbness, no tingling and no weakness     Past Medical History  Diagnosis Date  . GERD (gastroesophageal reflux disease)   . Hepatitis C    Past Surgical History  Procedure Laterality Date  . Cesarean section     No family history on file. Social History  Substance Use Topics  . Smoking status: Never Smoker   . Smokeless tobacco: None  . Alcohol Use: No   OB History    No data available     Review of Systems  Constitutional: Negative for fever.  HENT: Negative for sore throat.   Eyes: Negative for visual disturbance.  Respiratory: Negative for cough and shortness of breath.   Cardiovascular: Negative for chest pain.  Gastrointestinal: Positive for bowel incontinence (in setting of diarrhea, 2 times). Negative for abdominal pain.  Genitourinary: Negative for bladder incontinence, dysuria and difficulty urinating.  Musculoskeletal: Positive for back pain.  Negative for neck pain.  Skin: Negative for rash.  Neurological: Negative for tingling, syncope, weakness, numbness, headaches and paresthesias.      Allergies  Sulfonamide derivatives  Home Medications   Prior to Admission medications   Medication Sig Start Date End Date Taking? Authorizing Provider  aspirin EC 81 MG tablet Take 81 mg by mouth daily.   Yes Historical Provider, MD  cyclobenzaprine (FLEXERIL) 10 MG tablet Take 1 tablet (10 mg total) by mouth 2 (two) times daily as needed for muscle spasms. 11/06/15   Alvira Monday, MD  magnesium oxide (MAG-OX) 400 (241.3 Mg) MG tablet Take 400 mg by mouth daily.   Yes Historical Provider, MD  naproxen (NAPROSYN) 500 MG tablet Take 1 tablet (500 mg total) by mouth 2 (two) times daily with a meal. 11/06/15   Alvira Monday, MD  omeprazole (PRILOSEC) 40 MG capsule Take 40 mg by mouth daily.   Yes Historical Provider, MD  POTASSIUM PO Take 1 tablet by mouth daily.   Yes Historical Provider, MD  vitamin B-12 (CYANOCOBALAMIN) 1000 MCG tablet Take 1,000 mcg by mouth daily.   Yes Historical Provider, MD   BP 134/77 mmHg  Pulse 75  Temp(Src) 98.3 F (36.8 C) (Oral)  Resp 18  SpO2 99% Physical Exam  Constitutional: She is oriented to person, place, and time. She appears well-developed and well-nourished. No distress.  HENT:  Head: Normocephalic and atraumatic.  Eyes: Conjunctivae and EOM are normal.  Neck: Normal range of motion.  Cardiovascular: Normal rate, regular rhythm, normal heart sounds and intact distal pulses.  Exam reveals no gallop and  no friction rub.   No murmur heard. Pulmonary/Chest: Effort normal and breath sounds normal. No respiratory distress. She has no wheezes. She has no rales.  Abdominal: Soft. She exhibits no distension. There is no tenderness. There is no guarding.  Genitourinary: Rectal exam shows anal tone normal.  Musculoskeletal: She exhibits no edema.       Cervical back: She exhibits no tenderness and no  bony tenderness.       Thoracic back: She exhibits no tenderness and no bony tenderness.       Lumbar back: She exhibits tenderness and bony tenderness.  Neurological: She is alert and oriented to person, place, and time. She has normal strength. No sensory deficit. GCS eye subscore is 4. GCS verbal subscore is 5. GCS motor subscore is 6.  5/5 all movements of LE  Skin: Skin is warm and dry. No rash noted. She is not diaphoretic. No erythema.  Nursing note and vitals reviewed.   ED Course  Procedures (including critical care time) Labs Review Labs Reviewed - No data to display  Imaging Review No results found. I have personally reviewed and evaluated these images and lab results as part of my medical decision-making.   EKG Interpretation None      MDM   Final diagnoses:  Left-sided low back pain with left-sided sciatica  Herniated lumbar intervertebral disc, suspected   70yo female with hx of hepatitis C, GERD presents with concern for back pain in setting of needing to walk more at work.  Patient has a normal neurologic exam and denies any urinary retention or overflow incontinence, stool incontinence, saddle anesthesia, fever, IV drug use, trauma, chronic steroid use or immunocompromise and have low suspicion suspicion for cauda equina, fracture, epidural abscess, or vertebral osteomyelitis. Patient reports diarrhea with accident, however rectal tone is normal.  Patient with acute back pain, possible herniated disc versus other musculoskeletal pain. Given pain is exacerbated by significant ambulation, wrote work note for limited activity for first few days next week until patient is further dicrected by PCP.  Recommend close PCP follow up, consideration of outpt MRI given age.  Given rx for flexeril and naproxen. Patient discharged in stable condition with understanding of reasons to return.    Alvira MondayErin Ahyan Kreeger, MD 11/06/15 [phone]

## 2015-11-09 DIAGNOSIS — R159 Full incontinence of feces: Secondary | ICD-10-CM | POA: Diagnosis not present

## 2015-11-09 DIAGNOSIS — M5442 Lumbago with sciatica, left side: Secondary | ICD-10-CM | POA: Diagnosis not present

## 2015-11-10 ENCOUNTER — Other Ambulatory Visit: Payer: Self-pay | Admitting: Family

## 2015-11-10 DIAGNOSIS — M5442 Lumbago with sciatica, left side: Secondary | ICD-10-CM

## 2015-11-15 ENCOUNTER — Ambulatory Visit
Admission: RE | Admit: 2015-11-15 | Discharge: 2015-11-15 | Disposition: A | Discharge status: Home / Self Care | Payer: Commercial Managed Care - HMO | Source: Ambulatory Visit | Attending: Family | Admitting: Family

## 2015-11-15 DIAGNOSIS — M5442 Lumbago with sciatica, left side: Secondary | ICD-10-CM

## 2015-11-15 DIAGNOSIS — M5126 Other intervertebral disc displacement, lumbar region: Secondary | ICD-10-CM | POA: Diagnosis not present

## 2015-11-30 DIAGNOSIS — M545 Low back pain: Secondary | ICD-10-CM | POA: Diagnosis not present

## 2015-12-01 DIAGNOSIS — B182 Chronic viral hepatitis C: Secondary | ICD-10-CM | POA: Diagnosis not present

## 2016-01-07 ENCOUNTER — Ambulatory Visit (HOSPITAL_COMMUNITY): Payer: Commercial Managed Care - HMO

## 2016-02-24 ENCOUNTER — Ambulatory Visit (HOSPITAL_COMMUNITY)
Admission: RE | Admit: 2016-02-24 | Discharge: 2016-02-24 | Disposition: A | Discharge status: Home / Self Care | Payer: Commercial Managed Care - HMO | Source: Ambulatory Visit | Attending: Nurse Practitioner | Admitting: Nurse Practitioner

## 2016-02-24 DIAGNOSIS — B182 Chronic viral hepatitis C: Secondary | ICD-10-CM

## 2016-02-24 DIAGNOSIS — K8020 Calculus of gallbladder without cholecystitis without obstruction: Secondary | ICD-10-CM | POA: Diagnosis not present

## 2016-02-24 DIAGNOSIS — N1330 Unspecified hydronephrosis: Secondary | ICD-10-CM | POA: Insufficient documentation

## 2016-03-10 DIAGNOSIS — B182 Chronic viral hepatitis C: Secondary | ICD-10-CM | POA: Diagnosis not present

## 2016-03-11 DIAGNOSIS — K7469 Other cirrhosis of liver: Secondary | ICD-10-CM | POA: Diagnosis not present

## 2016-03-11 DIAGNOSIS — B182 Chronic viral hepatitis C: Secondary | ICD-10-CM | POA: Diagnosis not present

## 2016-04-08 DIAGNOSIS — B182 Chronic viral hepatitis C: Secondary | ICD-10-CM | POA: Diagnosis not present

## 2016-04-15 ENCOUNTER — Encounter: Payer: Self-pay | Admitting: Gastroenterology

## 2016-04-19 DIAGNOSIS — K7469 Other cirrhosis of liver: Secondary | ICD-10-CM | POA: Diagnosis not present

## 2016-04-19 DIAGNOSIS — B182 Chronic viral hepatitis C: Secondary | ICD-10-CM | POA: Diagnosis not present

## 2016-05-06 DIAGNOSIS — B182 Chronic viral hepatitis C: Secondary | ICD-10-CM | POA: Diagnosis not present

## 2016-05-20 DIAGNOSIS — B182 Chronic viral hepatitis C: Secondary | ICD-10-CM | POA: Diagnosis not present

## 2016-06-03 DIAGNOSIS — B182 Chronic viral hepatitis C: Secondary | ICD-10-CM | POA: Diagnosis not present

## 2016-06-20 DIAGNOSIS — B182 Chronic viral hepatitis C: Secondary | ICD-10-CM | POA: Diagnosis not present

## 2016-06-20 DIAGNOSIS — K7469 Other cirrhosis of liver: Secondary | ICD-10-CM | POA: Diagnosis not present

## 2016-06-28 ENCOUNTER — Ambulatory Visit: Payer: Commercial Managed Care - HMO | Admitting: Gastroenterology

## 2016-08-09 DIAGNOSIS — M793 Panniculitis, unspecified: Secondary | ICD-10-CM | POA: Diagnosis not present

## 2016-08-09 DIAGNOSIS — K219 Gastro-esophageal reflux disease without esophagitis: Secondary | ICD-10-CM | POA: Diagnosis not present

## 2016-08-09 DIAGNOSIS — M7550 Bursitis of unspecified shoulder: Secondary | ICD-10-CM | POA: Diagnosis not present

## 2016-08-09 DIAGNOSIS — K7460 Unspecified cirrhosis of liver: Secondary | ICD-10-CM | POA: Diagnosis not present

## 2016-10-21 DIAGNOSIS — M545 Low back pain: Secondary | ICD-10-CM | POA: Diagnosis not present

## 2016-11-14 DIAGNOSIS — K219 Gastro-esophageal reflux disease without esophagitis: Secondary | ICD-10-CM | POA: Diagnosis not present

## 2016-11-14 DIAGNOSIS — G8929 Other chronic pain: Secondary | ICD-10-CM | POA: Diagnosis not present

## 2016-11-14 DIAGNOSIS — M545 Low back pain: Secondary | ICD-10-CM | POA: Diagnosis not present

## 2017-02-09 DIAGNOSIS — R220 Localized swelling, mass and lump, head: Secondary | ICD-10-CM | POA: Diagnosis not present

## 2017-02-09 DIAGNOSIS — K219 Gastro-esophageal reflux disease without esophagitis: Secondary | ICD-10-CM | POA: Diagnosis not present

## 2017-02-09 DIAGNOSIS — M545 Low back pain: Secondary | ICD-10-CM | POA: Diagnosis not present

## 2017-02-10 DIAGNOSIS — R221 Localized swelling, mass and lump, neck: Secondary | ICD-10-CM | POA: Diagnosis not present

## 2017-02-10 DIAGNOSIS — M26602 Left temporomandibular joint disorder, unspecified: Secondary | ICD-10-CM | POA: Diagnosis not present

## 2017-02-10 DIAGNOSIS — R220 Localized swelling, mass and lump, head: Secondary | ICD-10-CM | POA: Diagnosis not present

## 2017-03-07 DIAGNOSIS — K119 Disease of salivary gland, unspecified: Secondary | ICD-10-CM | POA: Diagnosis not present

## 2017-03-10 DIAGNOSIS — K118 Other diseases of salivary glands: Secondary | ICD-10-CM | POA: Diagnosis not present

## 2017-03-10 DIAGNOSIS — K119 Disease of salivary gland, unspecified: Secondary | ICD-10-CM | POA: Diagnosis not present

## 2017-03-16 DIAGNOSIS — K118 Other diseases of salivary glands: Secondary | ICD-10-CM | POA: Diagnosis not present

## 2017-03-16 DIAGNOSIS — K119 Disease of salivary gland, unspecified: Secondary | ICD-10-CM | POA: Diagnosis not present

## 2017-03-20 DIAGNOSIS — H903 Sensorineural hearing loss, bilateral: Secondary | ICD-10-CM | POA: Diagnosis not present

## 2017-03-31 DIAGNOSIS — R252 Cramp and spasm: Secondary | ICD-10-CM | POA: Diagnosis not present

## 2017-03-31 DIAGNOSIS — Z882 Allergy status to sulfonamides status: Secondary | ICD-10-CM | POA: Diagnosis not present

## 2017-03-31 DIAGNOSIS — K219 Gastro-esophageal reflux disease without esophagitis: Secondary | ICD-10-CM | POA: Diagnosis not present

## 2017-03-31 DIAGNOSIS — K759 Inflammatory liver disease, unspecified: Secondary | ICD-10-CM | POA: Diagnosis not present

## 2017-03-31 DIAGNOSIS — R51 Headache: Secondary | ICD-10-CM | POA: Diagnosis not present

## 2017-03-31 DIAGNOSIS — Z79899 Other long term (current) drug therapy: Secondary | ICD-10-CM | POA: Diagnosis not present

## 2019-12-31 LAB — COLOGUARD

## 2020-05-26 ENCOUNTER — Ambulatory Visit (INDEPENDENT_AMBULATORY_CARE_PROVIDER_SITE_OTHER): Payer: 59 | Admitting: Family

## 2020-05-26 ENCOUNTER — Encounter: Payer: Self-pay | Admitting: Family

## 2020-05-26 ENCOUNTER — Other Ambulatory Visit: Payer: Self-pay

## 2020-05-26 VITALS — BP 120/86 | HR 75 | Temp 96.9°F | Resp 16 | Ht 64.0 in | Wt 189.2 lb

## 2020-05-26 DIAGNOSIS — F209 Schizophrenia, unspecified: Secondary | ICD-10-CM | POA: Diagnosis not present

## 2020-05-26 DIAGNOSIS — E538 Deficiency of other specified B group vitamins: Secondary | ICD-10-CM | POA: Diagnosis not present

## 2020-05-26 DIAGNOSIS — E669 Obesity, unspecified: Secondary | ICD-10-CM

## 2020-05-26 DIAGNOSIS — E66811 Obesity, class 1: Secondary | ICD-10-CM

## 2020-05-26 DIAGNOSIS — H9193 Unspecified hearing loss, bilateral: Secondary | ICD-10-CM

## 2020-05-26 DIAGNOSIS — E876 Hypokalemia: Secondary | ICD-10-CM

## 2020-05-26 DIAGNOSIS — F0281 Dementia in other diseases classified elsewhere with behavioral disturbance: Secondary | ICD-10-CM

## 2020-05-26 DIAGNOSIS — K219 Gastro-esophageal reflux disease without esophagitis: Secondary | ICD-10-CM

## 2020-05-26 DIAGNOSIS — G301 Alzheimer's disease with late onset: Secondary | ICD-10-CM

## 2020-05-26 DIAGNOSIS — R21 Rash and other nonspecific skin eruption: Secondary | ICD-10-CM
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
## 2020-05-27 LAB — LIPID PANEL
Cholesterol: 160 mg/dL (ref <200)
HDL: 41 mg/dL — ABNORMAL LOW (ref >=50)
LDL Cholesterol (Calc): 95 mg/dL (calc)
Non-HDL Cholesterol (Calc): 119 mg/dL (calc) (ref <130)
Total CHOL/HDL Ratio: 3.9 (calc) (ref <5.0)
Triglycerides: 138 mg/dL (ref <150)

## 2020-05-27 LAB — CBC WITH DIFFERENTIAL/PLATELET
Absolute Monocytes: 220 cells/uL (ref 200–950)
Basophils Absolute: 12 cells/uL (ref 0–200)
Basophils Relative: 0.3 %
Eosinophils Absolute: 60 cells/uL (ref 15–500)
Eosinophils Relative: 1.5 %
HCT: 38.7 % (ref 35.0–45.0)
Hemoglobin: 13 g/dL (ref 11.7–15.5)
Lymphs Abs: 864 cells/uL (ref 850–3900)
MCH: 29.5 pg (ref 27.0–33.0)
MCHC: 33.6 g/dL (ref 32.0–36.0)
MCV: 87.8 fL (ref 80.0–100.0)
MPV: 9.6 fL (ref 7.5–12.5)
Monocytes Relative: 5.5 %
Neutro Abs: 2844 cells/uL (ref 1500–7800)
Neutrophils Relative %: 71.1 %
Platelets: 118 10*3/uL — ABNORMAL LOW (ref 140–400)
RBC: 4.41 10*6/uL (ref 3.80–5.10)
RDW: 14.4 % (ref 11.0–15.0)
Total Lymphocyte: 21.6 %
WBC: 4 10*3/uL (ref 3.8–10.8)

## 2020-05-27 LAB — COMPLETE METABOLIC PANEL WITH GFR
AG Ratio: 1.3 (calc) (ref 1.0–2.5)
ALT: 8 U/L (ref 6–29)
AST: 16 U/L (ref 10–35)
Albumin: 3.9 g/dL (ref 3.6–5.1)
Alkaline phosphatase (APISO): 78 U/L (ref 37–153)
BUN: 14 mg/dL (ref 7–25)
CO2: 26 mmol/L (ref 20–32)
Calcium: 9.1 mg/dL (ref 8.6–10.4)
Chloride: 105 mmol/L (ref 98–110)
Creat: 0.82 mg/dL (ref 0.60–0.93)
GFR, Est African American: 83 mL/min/{1.73_m2} (ref >=60)
GFR, Est Non African American: 71 mL/min/{1.73_m2} (ref >=60)
Globulin: 2.9 g/dL (calc) (ref 1.9–3.7)
Glucose, Bld: 88 mg/dL (ref 65–99)
Potassium: 4 mmol/L (ref 3.5–5.3)
Sodium: 140 mmol/L (ref 135–146)
Total Bilirubin: 0.8 mg/dL (ref 0.2–1.2)
Total Protein: 6.8 g/dL (ref 6.1–8.1)

## 2020-05-27 LAB — VITAMIN B12: Vitamin B-12: 318 pg/mL (ref 200–1100)

## 2020-05-27 LAB — TSH: TSH: 3.1 mIU/L (ref 0.40–4.50)

## 2020-05-28 ENCOUNTER — Other Ambulatory Visit: Payer: Self-pay

## 2020-05-28 DIAGNOSIS — E538 Deficiency of other specified B group vitamins: Secondary | ICD-10-CM

## 2020-05-28 DIAGNOSIS — G301 Alzheimer's disease with late onset: Secondary | ICD-10-CM

## 2020-05-28 DIAGNOSIS — E669 Obesity, unspecified: Secondary | ICD-10-CM

## 2020-05-28 DIAGNOSIS — K219 Gastro-esophageal reflux disease without esophagitis: Secondary | ICD-10-CM

## 2020-05-28 DIAGNOSIS — F209 Schizophrenia, unspecified: Secondary | ICD-10-CM

## 2020-06-04 ENCOUNTER — Encounter: Payer: Self-pay | Admitting: Psychology

## 2020-06-11 MED ORDER — HYDROCORTISONE 0.5 % EX CREA: 1.0000 "application " | TOPICAL_CREAM | Freq: Two times a day (BID) | CUTANEOUS | 0 refills | Status: AC

## 2020-06-24 ENCOUNTER — Other Ambulatory Visit: Payer: Self-pay

## 2020-06-24 ENCOUNTER — Encounter: Payer: 59 | Attending: Psychology | Admitting: Psychology

## 2020-06-24 DIAGNOSIS — F0391 Unspecified dementia with behavioral disturbance: Secondary | ICD-10-CM | POA: Diagnosis not present

## 2020-06-24 DIAGNOSIS — Z79899 Other long term (current) drug therapy: Secondary | ICD-10-CM | POA: Diagnosis not present

## 2020-06-24 DIAGNOSIS — G301 Alzheimer's disease with late onset: Secondary | ICD-10-CM | POA: Insufficient documentation

## 2020-06-24 DIAGNOSIS — F209 Schizophrenia, unspecified: Secondary | ICD-10-CM | POA: Insufficient documentation

## 2020-06-24 DIAGNOSIS — F02818 Dementia in other diseases classified elsewhere, unspecified severity, with other behavioral disturbance: Secondary | ICD-10-CM

## 2020-06-24 DIAGNOSIS — F0281 Dementia in other diseases classified elsewhere with behavioral disturbance: Secondary | ICD-10-CM | POA: Insufficient documentation
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
## 2020-06-29 ENCOUNTER — Encounter: Payer: Self-pay | Admitting: Psychology

## 2020-07-02 ENCOUNTER — Other Ambulatory Visit: Payer: Self-pay

## 2020-07-09 ENCOUNTER — Other Ambulatory Visit: Payer: Self-pay

## 2020-07-09 ENCOUNTER — Other Ambulatory Visit: Payer: 59

## 2020-07-09 DIAGNOSIS — K219 Gastro-esophageal reflux disease without esophagitis: Secondary | ICD-10-CM

## 2020-07-09 DIAGNOSIS — F0281 Dementia in other diseases classified elsewhere with behavioral disturbance: Secondary | ICD-10-CM

## 2020-07-09 DIAGNOSIS — F209 Schizophrenia, unspecified: Secondary | ICD-10-CM

## 2020-07-09 DIAGNOSIS — G301 Alzheimer's disease with late onset: Secondary | ICD-10-CM

## 2020-07-09 LAB — CBC WITH DIFFERENTIAL/PLATELET
Absolute Monocytes: 253 cells/uL (ref 200–950)
Basophils Absolute: 19 cells/uL (ref 0–200)
Basophils Relative: 0.6 %
Eosinophils Absolute: 51 cells/uL (ref 15–500)
Eosinophils Relative: 1.6 %
HCT: 38.5 % (ref 35.0–45.0)
Hemoglobin: 12.8 g/dL (ref 11.7–15.5)
Lymphs Abs: 749 cells/uL — ABNORMAL LOW (ref 850–3900)
MCH: 29.8 pg (ref 27.0–33.0)
MCHC: 33.2 g/dL (ref 32.0–36.0)
MCV: 89.5 fL (ref 80.0–100.0)
MPV: 10.2 fL (ref 7.5–12.5)
Monocytes Relative: 7.9 %
Neutro Abs: 2128 cells/uL (ref 1500–7800)
Neutrophils Relative %: 66.5 %
Platelets: 122 10*3/uL — ABNORMAL LOW (ref 140–400)
RBC: 4.3 10*6/uL (ref 3.80–5.10)
RDW: 13.9 % (ref 11.0–15.0)
Total Lymphocyte: 23.4 %
WBC: 3.2 10*3/uL — ABNORMAL LOW (ref 3.8–10.8)

## 2020-07-15 ENCOUNTER — Other Ambulatory Visit: Payer: Self-pay

## 2020-07-15 MED ORDER — ASCORBIC ACID 500 MG PO TABS: 500.0000 mg | ORAL_TABLET | Freq: Two times a day (BID) | ORAL | 11 refills | Status: DC

## 2020-07-20 ENCOUNTER — Other Ambulatory Visit: Payer: Self-pay

## 2020-07-20 ENCOUNTER — Encounter (HOSPITAL_BASED_OUTPATIENT_CLINIC_OR_DEPARTMENT_OTHER): Payer: 59 | Admitting: Psychology

## 2020-07-20 ENCOUNTER — Encounter: Payer: Self-pay | Admitting: Psychology

## 2020-07-20 DIAGNOSIS — F0391 Unspecified dementia with behavioral disturbance: Secondary | ICD-10-CM

## 2020-07-20 DIAGNOSIS — G301 Alzheimer's disease with late onset: Secondary | ICD-10-CM | POA: Diagnosis not present

## 2020-07-20 NOTE — Progress Notes (Addendum)
   Neuropsychology Note  SYRIA KESTNER completed 120 minutes of neuropsychological testing with this provider. The patient did not appear overtly distressed by the testing session, per behavioral observation or via self-report. Rest breaks were offered.   Orientation: Correct year and month; Incorrect date and day of week. Not able to recall current president. Identified "Danae Orleans" as his predecessor.    Tests Administered:  Animal Naming   Controlled Oral Word Association (COWA)  Repeatable Battery for the Assessment of Neuropsychological Status, Update, (RBANS-A)  Wechsler Adult Intelligence Scale, 4th Edition, (WAIS-IV), select subtests  Wide Range Achievement Test (WRAT-5), Word Reading   Results:    Animals   Total=9, T=28, 2nd percentile     COWAT (FAS)   Total=38, T=50, 50th Percentile     Measure: RBANS Form A  Standard or Scaled Score Percentile Description  Immediate Memory 76 5 Well Below Average  List Learning 6 9 Below Average  Story Memory 5 5 Well Below Average  Visuospatial/Constructional 69 2 Exceptionally Low   Figure Copy 5 5 Well Below Average  Line Orientation - 3-9 Well Below Average  Language 68 2 Exceptionally Low   Picture Naming - 3-9 Well Below Average  Semantic Fluency 4 2 Well Below Average  Attention 79 8 Well Below Average  Digit Span 10 50 Average  Coding 3 1 Exceptionally Low  Delayed Memory 44 <0.1 Exceptionally Low  List Recall - <2 Exceptionally Low  List Recognition - <2 Exceptionally Low  Story Recall 1 <1 Exceptionally Low  Figure Recall 1 1 Exceptionally Low  Total Score  58 0.3 Exceptionally Low    WAIS-IV VCI  Similarities   ss=7, 16th percentile  Vocabulary  ss=9, 37th percentile  WAIS-IV PRI  Block Design  ss=8, 25th percentile   Matrix Reasoning Ss=6, 9th percentile  WAIS-IV WMI   SS=77, 6th percentile  Arithmetic   ss=5, 5th percentile  Digit Span  Ss=7, 16th percentile   Word Reading (Blue  Form)  Total=43  SS=74, 4th percentile  Feedback to Patient:  SHAVY BEACHEM will return within approximately 2 weeks for an interactive feedback session with this provider at which time her test performances, clinical impressions and treatment recommendations will be reviewed in detail. The patient understands she can contact our office should she require our assistance before this time.   Full report to follow.

## 2020-09-15 ENCOUNTER — Other Ambulatory Visit: Payer: Self-pay

## 2020-09-15 ENCOUNTER — Encounter: Payer: Medicare (Managed Care) | Attending: Psychology | Admitting: Psychology

## 2020-09-15 DIAGNOSIS — F209 Schizophrenia, unspecified: Secondary | ICD-10-CM | POA: Diagnosis not present

## 2020-09-15 DIAGNOSIS — F0390 Unspecified dementia without behavioral disturbance: Secondary | ICD-10-CM | POA: Diagnosis not present

## 2020-09-15 DIAGNOSIS — G301 Alzheimer's disease with late onset: Secondary | ICD-10-CM | POA: Insufficient documentation

## 2020-09-15 DIAGNOSIS — Z79899 Other long term (current) drug therapy: Secondary | ICD-10-CM | POA: Diagnosis not present

## 2020-09-15 DIAGNOSIS — F0281 Dementia in other diseases classified elsewhere with behavioral disturbance: Secondary | ICD-10-CM | POA: Diagnosis not present

## 2020-09-19 ENCOUNTER — Encounter: Payer: Self-pay | Admitting: Psychology

## 2020-09-19 NOTE — Progress Notes (Signed)
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 Frightening hallucinations often subside in well-lit areas where other people are present. - Try to turn the person's attention to music, conversation or activities you enjoy together. o Respond honestly - If the person asks you about a hallucination or delusion, be honest. For example, if he or she asks, "Do you see him?" you may want to answer with, "I know you see something, but I don't see it." This way, you're not denying what the person sees or hears, but you avoid an argument. o Modify the environment - Check for sounds that might be misinterpreted, such as noise from a television or an air conditioner. - Look for lighting that casts shadows, reflections or distortions on the surfaces of floors, walls and furniture. Turn on lights to reduce shadows. - Cover mirrors with a cloth or remove them if the person thinks that he or she is looking at a stranger.  Delusions (firmly held beliefs in things that are not real) may occur in middle-to-late-stage dementia. Confusion and memory loss - such as the inability to remember certain people or objects -  can contribute to these untrue beliefs. A person with dementia may  believe a family member is stealing his or her possessions or that he or she is being followed by the police. This kind of suspicious delusion is sometimes referred to as paranoia. Although not grounded in reality, the situation is very real to the person with dementia. Keep in mind that a person with dementia is trying to make sense of his or her world with declining cognitive function. A delusion is not the same thing as a hallucination. While delusions involve false beliefs, hallucinations are false perceptions of objects or events that are sensory in nature. The following strategies may be helpful in responding to delusions: o Don't take offense. Listen to what is troubling the person, and try to understand that reality. Then be reassuring, and let the person know you care. o Don't argue or try to convince. Allow the individual to express ideas. Acknowledge his or her opinions. o Offer a simple answer. Share your thoughts with the individual, but keep it simple. Don't overwhelm the person with lengthy explanations or reasons. o Switch the focus to another activity. Engage the individual in an activity, or ask for help with a chore. o Duplicate any lost items. If the person is often searching for a specific item, have several available (if possible). For example, if the individual is always looking for his or her wallet, purchase two of the same kind.  If you have any questions, please contact us at [phone].   This report is intended solely for the confidential review and use by the referring professional to assist in diagnostic and medical decision making needs.  This report should not be released to a third party without proper consent. [NOTE: data can be made available to qualified professionals with permission from the patient or legal representative/caregiver]    ____________________________________ Alfonso Ellis, PsyD,  Clinical Neuropsychologist    Feedback to Patient: VELETA YAMAMOTO will return for a feedback appointment within 2-4 weeks to review the results of her neuropsychological evaluation with this provider.   Thank you for your referral of JAMESHA ELLSWORTH. Please feel free to contact me if you have any questions or concerns regarding this report.

## 2020-11-24 ENCOUNTER — Other Ambulatory Visit: Payer: Self-pay

## 2020-11-24 ENCOUNTER — Other Ambulatory Visit: Payer: Medicare (Managed Care)

## 2020-11-24 DIAGNOSIS — E538 Deficiency of other specified B group vitamins: Secondary | ICD-10-CM

## 2020-11-24 DIAGNOSIS — G301 Alzheimer's disease with late onset: Secondary | ICD-10-CM

## 2020-11-24 DIAGNOSIS — F0281 Dementia in other diseases classified elsewhere with behavioral disturbance: Secondary | ICD-10-CM

## 2020-11-24 DIAGNOSIS — E669 Obesity, unspecified: Secondary | ICD-10-CM

## 2020-11-25 LAB — COMPLETE METABOLIC PANEL WITH GFR
AG Ratio: 1.5 (calc) (ref 1.0–2.5)
ALT: 12 U/L (ref 6–29)
AST: 17 U/L (ref 10–35)
Albumin: 3.9 g/dL (ref 3.6–5.1)
Alkaline phosphatase (APISO): 84 U/L (ref 37–153)
BUN: 11 mg/dL (ref 7–25)
CO2: 25 mmol/L (ref 20–32)
Calcium: 9.1 mg/dL (ref 8.6–10.4)
Chloride: 106 mmol/L (ref 98–110)
Creat: 0.92 mg/dL (ref 0.60–0.93)
GFR, Est African American: 72 mL/min/{1.73_m2} (ref >=60)
GFR, Est Non African American: 62 mL/min/{1.73_m2} (ref >=60)
Globulin: 2.6 g/dL (calc) (ref 1.9–3.7)
Glucose, Bld: 101 mg/dL — ABNORMAL HIGH (ref 65–99)
Potassium: 3.9 mmol/L (ref 3.5–5.3)
Sodium: 140 mmol/L (ref 135–146)
Total Bilirubin: 0.9 mg/dL (ref 0.2–1.2)
Total Protein: 6.5 g/dL (ref 6.1–8.1)

## 2020-11-25 LAB — LIPID PANEL
Cholesterol: 166 mg/dL (ref <200)
HDL: 40 mg/dL — ABNORMAL LOW (ref >=50)
LDL Cholesterol (Calc): 102 mg/dL (calc) — ABNORMAL HIGH
Non-HDL Cholesterol (Calc): 126 mg/dL (calc) (ref <130)
Total CHOL/HDL Ratio: 4.2 (calc) (ref <5.0)
Triglycerides: 144 mg/dL (ref <150)

## 2020-11-25 LAB — VITAMIN B12: Vitamin B-12: 264 pg/mL (ref 200–1100)

## 2020-11-25 LAB — TSH: TSH: 2.89 mIU/L (ref 0.40–4.50)

## 2020-11-27 ENCOUNTER — Other Ambulatory Visit: Payer: Self-pay

## 2020-11-27 ENCOUNTER — Encounter: Payer: Self-pay | Admitting: Family

## 2020-11-27 ENCOUNTER — Encounter: Payer: 59 | Admitting: Family

## 2020-12-06 NOTE — Progress Notes (Signed)
  This encounter was created in error - please disregard.Not seen cancelled.

## 2021-01-19 ENCOUNTER — Inpatient Hospital Stay (HOSPITAL_COMMUNITY)
Admission: EM | Admit: 2021-01-19 | Discharge: 2021-01-27 | DRG: 522 | Disposition: A | Discharge status: Skilled Nursing Facility | Payer: Medicare (Managed Care) | Attending: Family Medicine | Admitting: Family Medicine

## 2021-01-19 ENCOUNTER — Inpatient Hospital Stay (HOSPITAL_COMMUNITY): Payer: Medicare (Managed Care) | Admitting: Certified Registered"

## 2021-01-19 ENCOUNTER — Encounter (HOSPITAL_COMMUNITY): Payer: Self-pay | Admitting: Internal Medicine

## 2021-01-19 ENCOUNTER — Inpatient Hospital Stay (HOSPITAL_COMMUNITY): Payer: Medicare (Managed Care)

## 2021-01-19 ENCOUNTER — Other Ambulatory Visit: Payer: Medicare (Managed Care)

## 2021-01-19 ENCOUNTER — Emergency Department (HOSPITAL_COMMUNITY): Payer: Medicare (Managed Care)

## 2021-01-19 ENCOUNTER — Encounter (HOSPITAL_COMMUNITY)
Admission: EM | Disposition: A | Discharge status: Skilled Nursing Facility | Payer: Self-pay | Source: Home / Self Care | Attending: Internal Medicine

## 2021-01-19 DIAGNOSIS — S72042A Displaced fracture of base of neck of left femur, initial encounter for closed fracture: Secondary | ICD-10-CM

## 2021-01-19 DIAGNOSIS — F0151 Vascular dementia with behavioral disturbance: Secondary | ICD-10-CM | POA: Diagnosis not present

## 2021-01-19 DIAGNOSIS — W1830XA Fall on same level, unspecified, initial encounter: Secondary | ICD-10-CM | POA: Diagnosis present

## 2021-01-19 DIAGNOSIS — M25552 Pain in left hip: Secondary | ICD-10-CM | POA: Diagnosis present

## 2021-01-19 DIAGNOSIS — R296 Repeated falls: Secondary | ICD-10-CM | POA: Diagnosis present

## 2021-01-19 DIAGNOSIS — W19XXXA Unspecified fall, initial encounter: Secondary | ICD-10-CM

## 2021-01-19 DIAGNOSIS — R5082 Postprocedural fever: Secondary | ICD-10-CM | POA: Diagnosis not present

## 2021-01-19 DIAGNOSIS — Z20822 Contact with and (suspected) exposure to covid-19: Secondary | ICD-10-CM | POA: Diagnosis present

## 2021-01-19 DIAGNOSIS — Z419 Encounter for procedure for purposes other than remedying health state, unspecified: Secondary | ICD-10-CM

## 2021-01-19 DIAGNOSIS — B1920 Unspecified viral hepatitis C without hepatic coma: Secondary | ICD-10-CM | POA: Diagnosis present

## 2021-01-19 DIAGNOSIS — Z882 Allergy status to sulfonamides status: Secondary | ICD-10-CM | POA: Diagnosis not present

## 2021-01-19 DIAGNOSIS — Z6836 Body mass index (BMI) 36.0-36.9, adult: Secondary | ICD-10-CM

## 2021-01-19 DIAGNOSIS — F03918 Unspecified dementia, unspecified severity, with other behavioral disturbance: Secondary | ICD-10-CM | POA: Diagnosis present

## 2021-01-19 DIAGNOSIS — E6609 Other obesity due to excess calories: Secondary | ICD-10-CM | POA: Diagnosis present

## 2021-01-19 DIAGNOSIS — K219 Gastro-esophageal reflux disease without esophagitis: Secondary | ICD-10-CM | POA: Diagnosis present

## 2021-01-19 DIAGNOSIS — Z96642 Presence of left artificial hip joint: Secondary | ICD-10-CM

## 2021-01-19 DIAGNOSIS — G309 Alzheimer's disease, unspecified: Secondary | ICD-10-CM | POA: Diagnosis present

## 2021-01-19 DIAGNOSIS — S72002A Fracture of unspecified part of neck of left femur, initial encounter for closed fracture: Principal | ICD-10-CM | POA: Diagnosis present

## 2021-01-19 DIAGNOSIS — F0281 Dementia in other diseases classified elsewhere with behavioral disturbance: Secondary | ICD-10-CM | POA: Diagnosis present

## 2021-01-19 DIAGNOSIS — Z79899 Other long term (current) drug therapy: Secondary | ICD-10-CM | POA: Diagnosis not present

## 2021-01-19 DIAGNOSIS — R509 Fever, unspecified: Secondary | ICD-10-CM

## 2021-01-19 DIAGNOSIS — F0391 Unspecified dementia with behavioral disturbance: Secondary | ICD-10-CM | POA: Diagnosis present

## 2021-01-19 HISTORY — DX: Other complications of anesthesia, initial encounter: T88.59XA

## 2021-01-19 HISTORY — PX: TOTAL HIP ARTHROPLASTY: SHX124

## 2021-01-19 LAB — PROTIME-INR
INR: 1.1 (ref 0.8–1.2)
Prothrombin Time: 14 seconds (ref 11.4–15.2)

## 2021-01-19 LAB — BASIC METABOLIC PANEL
Anion gap: 8 (ref 5–15)
BUN: 8 mg/dL (ref 8–23)
CO2: 24 mmol/L (ref 22–32)
Calcium: 9.2 mg/dL (ref 8.9–10.3)
Chloride: 103 mmol/L (ref 98–111)
Creatinine, Ser: 0.75 mg/dL (ref 0.44–1.00)
GFR, Estimated: >60 mL/min (ref >60)
Glucose, Bld: 122 mg/dL — ABNORMAL HIGH (ref 70–99)
Potassium: 3.8 mmol/L (ref 3.5–5.1)
Sodium: 135 mmol/L (ref 135–145)

## 2021-01-19 LAB — CBC WITH DIFFERENTIAL/PLATELET
Abs Immature Granulocytes: 0.03 10*3/uL (ref 0.00–0.07)
Basophils Absolute: 0 10*3/uL (ref 0.0–0.1)
Basophils Relative: 0 %
Eosinophils Absolute: 0.1 10*3/uL (ref 0.0–0.5)
Eosinophils Relative: 1 %
HCT: 41.8 % (ref 36.0–46.0)
Hemoglobin: 14.2 g/dL (ref 12.0–15.0)
Immature Granulocytes: 1 %
Lymphocytes Relative: 12 %
Lymphs Abs: 0.6 10*3/uL — ABNORMAL LOW (ref 0.7–4.0)
MCH: 30.3 pg (ref 26.0–34.0)
MCHC: 34 g/dL (ref 30.0–36.0)
MCV: 89.1 fL (ref 80.0–100.0)
Monocytes Absolute: 0.3 10*3/uL (ref 0.1–1.0)
Monocytes Relative: 6 %
Neutro Abs: 4.1 10*3/uL (ref 1.7–7.7)
Neutrophils Relative %: 80 %
Platelets: UNDETERMINED 10*3/uL (ref 150–400)
RBC: 4.69 MIL/uL (ref 3.87–5.11)
RDW: 14.3 % (ref 11.5–15.5)
WBC: 5.1 10*3/uL (ref 4.0–10.5)
nRBC: 0 % (ref 0.0–0.2)

## 2021-01-19 LAB — URINALYSIS, ROUTINE W REFLEX MICROSCOPIC
Bacteria, UA: NONE SEEN
Bilirubin Urine: NEGATIVE
Glucose, UA: NEGATIVE mg/dL
Hgb urine dipstick: NEGATIVE
Ketones, ur: NEGATIVE mg/dL
Nitrite: NEGATIVE
Protein, ur: NEGATIVE mg/dL
Specific Gravity, Urine: 1.013 (ref 1.005–1.030)
pH: 6 (ref 5.0–8.0)

## 2021-01-19 LAB — TYPE AND SCREEN
ABO/RH(D): O NEG
Antibody Screen: NEGATIVE

## 2021-01-19 LAB — ABO/RH: ABO/RH(D): O NEG

## 2021-01-19 LAB — RESP PANEL BY RT-PCR (FLU A&B, COVID) ARPGX2
Influenza A by PCR: NEGATIVE
Influenza B by PCR: NEGATIVE
SARS Coronavirus 2 by RT PCR: NEGATIVE

## 2021-01-19 LAB — SURGICAL PCR SCREEN
MRSA, PCR: NEGATIVE
Staphylococcus aureus: NEGATIVE

## 2021-01-19 SURGERY — ARTHROPLASTY, HIP, TOTAL, ANTERIOR APPROACH
Anesthesia: General | Site: Hip | Laterality: Left

## 2021-01-19 MED ORDER — LORAZEPAM 2 MG/ML IJ SOLN: 1.0000 mg | INTRAMUSCULAR | Status: DC | PRN

## 2021-01-19 MED ORDER — CHLORHEXIDINE GLUCONATE 4 % EX LIQD: 60.0000 mL | Freq: Once | CUTANEOUS | Status: DC

## 2021-01-19 MED ORDER — STERILE WATER FOR IRRIGATION IR SOLN
Status: DC | PRN
  Administered 2021-01-19: 1000 mL

## 2021-01-19 MED ORDER — DOCUSATE SODIUM 100 MG PO CAPS: 100.0000 mg | ORAL_CAPSULE | Freq: Two times a day (BID) | ORAL | Status: DC

## 2021-01-19 MED ORDER — ROCURONIUM BROMIDE 10 MG/ML (PF) SYRINGE
PREFILLED_SYRINGE | INTRAVENOUS | Status: AC
  Filled 2021-01-19: qty 30

## 2021-01-19 MED ORDER — PANTOPRAZOLE SODIUM 40 MG PO TBEC
40.0000 mg | DELAYED_RELEASE_TABLET | Freq: Every day | ORAL | Status: DC
  Administered 2021-01-20 – 2021-01-27 (×8): 40 mg via ORAL
  Filled 2021-01-19 (×8): qty 1

## 2021-01-19 MED ORDER — OXYCODONE HCL 5 MG PO TABS
5.0000 mg | ORAL_TABLET | Freq: Once | ORAL | Status: DC | PRN
Start: 2021-01-19 — End: 2021-01-20

## 2021-01-19 MED ORDER — LORAZEPAM 1 MG PO TABS: 1.0000 mg | ORAL_TABLET | Freq: Four times a day (QID) | ORAL | Status: DC | PRN

## 2021-01-19 MED ORDER — ACETAMINOPHEN 160 MG/5ML PO SOLN: 1000.0000 mg | Freq: Once | ORAL | Status: DC | PRN

## 2021-01-19 MED ORDER — CHLORHEXIDINE GLUCONATE 0.12 % MT SOLN
OROMUCOSAL | Status: AC
  Administered 2021-01-19: 15 mL via OROMUCOSAL
  Filled 2021-01-19: qty 15

## 2021-01-19 MED ORDER — SUCCINYLCHOLINE CHLORIDE 200 MG/10ML IV SOSY
PREFILLED_SYRINGE | INTRAVENOUS | Status: AC
  Filled 2021-01-19: qty 20

## 2021-01-19 MED ORDER — ONDANSETRON HCL 4 MG/2ML IJ SOLN
INTRAMUSCULAR | Status: DC | PRN
  Administered 2021-01-19: 4 mg via INTRAVENOUS

## 2021-01-19 MED ORDER — POLYETHYLENE GLYCOL 3350 17 G PO PACK: 17.0000 g | PACK | Freq: Every day | ORAL | Status: DC | PRN

## 2021-01-19 MED ORDER — ACETAMINOPHEN 500 MG PO TABS: 1000.0000 mg | ORAL_TABLET | Freq: Once | ORAL | Status: DC | PRN

## 2021-01-19 MED ORDER — SUGAMMADEX SODIUM 200 MG/2ML IV SOLN
INTRAVENOUS | Status: DC | PRN
  Administered 2021-01-19: 200 mg via INTRAVENOUS

## 2021-01-19 MED ORDER — MORPHINE SULFATE (PF) 2 MG/ML IV SOLN: 0.5000 mg | INTRAVENOUS | Status: DC | PRN

## 2021-01-19 MED ORDER — LIDOCAINE 2% (20 MG/ML) 5 ML SYRINGE
INTRAMUSCULAR | Status: AC
  Filled 2021-01-19: qty 5

## 2021-01-19 MED ORDER — ORAL CARE MOUTH RINSE: 15.0000 mL | Freq: Once | OROMUCOSAL | Status: AC

## 2021-01-19 MED ORDER — LACTATED RINGERS IV SOLN: INTRAVENOUS | Status: DC

## 2021-01-19 MED ORDER — LORAZEPAM 0.5 MG PO TABS
0.5000 mg | ORAL_TABLET | Freq: Two times a day (BID) | ORAL | Status: DC
  Administered 2021-01-20 – 2021-01-27 (×15): 0.5 mg via ORAL
  Filled 2021-01-19 (×15): qty 1

## 2021-01-19 MED ORDER — BISACODYL 5 MG PO TBEC: 5.0000 mg | DELAYED_RELEASE_TABLET | Freq: Every day | ORAL | Status: DC | PRN

## 2021-01-19 MED ORDER — HALOPERIDOL 5 MG PO TABS
5.0000 mg | ORAL_TABLET | Freq: Four times a day (QID) | ORAL | Status: DC | PRN
  Filled 2021-01-19: qty 1

## 2021-01-19 MED ORDER — POVIDONE-IODINE 10 % EX SWAB
2.0000 "application " | Freq: Once | CUTANEOUS | Status: AC
  Administered 2021-01-19: 2 via TOPICAL

## 2021-01-19 MED ORDER — METHOCARBAMOL 500 MG PO TABS
500.0000 mg | ORAL_TABLET | Freq: Four times a day (QID) | ORAL | Status: DC | PRN
  Administered 2021-01-20: 500 mg via ORAL
  Filled 2021-01-19: qty 1

## 2021-01-19 MED ORDER — ONDANSETRON HCL 4 MG/2ML IJ SOLN
INTRAMUSCULAR | Status: AC
  Filled 2021-01-19: qty 2

## 2021-01-19 MED ORDER — OXYCODONE HCL 5 MG/5ML PO SOLN: 5.0000 mg | Freq: Once | ORAL | Status: DC | PRN

## 2021-01-19 MED ORDER — ACETAMINOPHEN 10 MG/ML IV SOLN: 1000.0000 mg | Freq: Once | INTRAVENOUS | Status: DC | PRN

## 2021-01-19 MED ORDER — ACETAMINOPHEN 325 MG PO TABS
650.0000 mg | ORAL_TABLET | Freq: Four times a day (QID) | ORAL | Status: DC | PRN
  Administered 2021-01-21: 650 mg via ORAL
  Filled 2021-01-19: qty 2

## 2021-01-19 MED ORDER — FENTANYL CITRATE (PF) 100 MCG/2ML IJ SOLN
25.0000 ug | INTRAMUSCULAR | Status: DC | PRN
  Administered 2021-01-20: 50 ug via INTRAVENOUS

## 2021-01-19 MED ORDER — DEXAMETHASONE SODIUM PHOSPHATE 10 MG/ML IJ SOLN
INTRAMUSCULAR | Status: AC
  Filled 2021-01-19: qty 1

## 2021-01-19 MED ORDER — FENTANYL CITRATE (PF) 250 MCG/5ML IJ SOLN
INTRAMUSCULAR | Status: AC
  Filled 2021-01-19: qty 5

## 2021-01-19 MED ORDER — FENTANYL CITRATE (PF) 250 MCG/5ML IJ SOLN
INTRAMUSCULAR | Status: DC | PRN
  Administered 2021-01-19 (×3): 50 ug via INTRAVENOUS
  Administered 2021-01-19: 100 ug via INTRAVENOUS

## 2021-01-19 MED ORDER — EPHEDRINE 5 MG/ML INJ
INTRAVENOUS | Status: AC
  Filled 2021-01-19: qty 10

## 2021-01-19 MED ORDER — PROPOFOL 10 MG/ML IV BOLUS
INTRAVENOUS | Status: DC | PRN
  Administered 2021-01-19: 100 mg via INTRAVENOUS

## 2021-01-19 MED ORDER — ZIPRASIDONE HCL 40 MG PO CAPS
40.0000 mg | ORAL_CAPSULE | Freq: Every day | ORAL | Status: DC
  Administered 2021-01-20 – 2021-01-27 (×8): 40 mg via ORAL
  Filled 2021-01-19 (×9): qty 1

## 2021-01-19 MED ORDER — PHENYLEPHRINE 40 MCG/ML (10ML) SYRINGE FOR IV PUSH (FOR BLOOD PRESSURE SUPPORT)
PREFILLED_SYRINGE | INTRAVENOUS | Status: AC
  Filled 2021-01-19: qty 20

## 2021-01-19 MED ORDER — LIDOCAINE HCL (CARDIAC) PF 100 MG/5ML IV SOSY
PREFILLED_SYRINGE | INTRAVENOUS | Status: DC | PRN
  Administered 2021-01-19: 60 mg via INTRATRACHEAL

## 2021-01-19 MED ORDER — TRANEXAMIC ACID-NACL 1000-0.7 MG/100ML-% IV SOLN
1000.0000 mg | INTRAVENOUS | Status: AC
  Administered 2021-01-19: 1000 mg via INTRAVENOUS
  Filled 2021-01-19 (×2): qty 100

## 2021-01-19 MED ORDER — HYDROCODONE-ACETAMINOPHEN 5-325 MG PO TABS
1.0000 | ORAL_TABLET | Freq: Four times a day (QID) | ORAL | Status: DC | PRN
  Administered 2021-01-21: 2 via ORAL
  Administered 2021-01-24 – 2021-01-26 (×2): 1 via ORAL
  Filled 2021-01-19: qty 1
  Filled 2021-01-19: qty 2

## 2021-01-19 MED ORDER — FENTANYL CITRATE (PF) 100 MCG/2ML IJ SOLN
50.0000 ug | INTRAMUSCULAR | Status: DC | PRN
  Administered 2021-01-19: 50 ug via INTRAVENOUS
  Filled 2021-01-19: qty 2

## 2021-01-19 MED ORDER — SODIUM CHLORIDE 0.9 % IR SOLN
Status: DC | PRN
  Administered 2021-01-19: 1000 mL

## 2021-01-19 MED ORDER — PHENYLEPHRINE HCL (PRESSORS) 10 MG/ML IV SOLN
INTRAVENOUS | Status: DC | PRN
  Administered 2021-01-19: 80 ug via INTRAVENOUS

## 2021-01-19 MED ORDER — PHENYLEPHRINE HCL-NACL 10-0.9 MG/250ML-% IV SOLN
INTRAVENOUS | Status: DC | PRN
  Administered 2021-01-19: 20 ug/min via INTRAVENOUS

## 2021-01-19 MED ORDER — ROCURONIUM BROMIDE 100 MG/10ML IV SOLN
INTRAVENOUS | Status: DC | PRN
  Administered 2021-01-19: 60 mg via INTRAVENOUS

## 2021-01-19 MED ORDER — TRAZODONE HCL 50 MG PO TABS
50.0000 mg | ORAL_TABLET | Freq: Every day | ORAL | Status: DC
  Administered 2021-01-20 – 2021-01-26 (×7): 50 mg via ORAL
  Filled 2021-01-19 (×7): qty 1

## 2021-01-19 MED ORDER — METHOCARBAMOL 1000 MG/10ML IJ SOLN
500.0000 mg | Freq: Four times a day (QID) | INTRAMUSCULAR | Status: DC | PRN
  Filled 2021-01-19 (×3): qty 5

## 2021-01-19 MED ORDER — ARTIFICIAL TEARS OPHTHALMIC OINT
TOPICAL_OINTMENT | OPHTHALMIC | Status: AC
  Filled 2021-01-19: qty 7

## 2021-01-19 MED ORDER — CEFAZOLIN SODIUM-DEXTROSE 2-4 GM/100ML-% IV SOLN
2.0000 g | INTRAVENOUS | Status: AC
  Administered 2021-01-19: 2 g via INTRAVENOUS
  Filled 2021-01-19 (×2): qty 100

## 2021-01-19 MED ORDER — ONDANSETRON HCL 4 MG/2ML IJ SOLN
INTRAMUSCULAR | Status: AC
  Filled 2021-01-19: qty 6

## 2021-01-19 MED ORDER — LACTATED RINGERS IV SOLN: INTRAVENOUS | Status: DC | PRN

## 2021-01-19 MED ORDER — 0.9 % SODIUM CHLORIDE (POUR BTL) OPTIME
TOPICAL | Status: DC | PRN
  Administered 2021-01-19: 1000 mL

## 2021-01-19 MED ORDER — ONDANSETRON HCL 4 MG/2ML IJ SOLN: 4.0000 mg | Freq: Four times a day (QID) | INTRAMUSCULAR | Status: DC | PRN

## 2021-01-19 MED ORDER — PROPOFOL 10 MG/ML IV BOLUS
INTRAVENOUS | Status: AC
  Filled 2021-01-19: qty 20

## 2021-01-19 MED ORDER — CHLORHEXIDINE GLUCONATE 0.12 % MT SOLN: 15.0000 mL | Freq: Once | OROMUCOSAL | Status: AC

## 2021-01-19 MED ORDER — PROPOFOL 1000 MG/100ML IV EMUL
INTRAVENOUS | Status: AC
  Filled 2021-01-19: qty 100

## 2021-01-19 MED ORDER — ONDANSETRON HCL 4 MG/2ML IJ SOLN
4.0000 mg | Freq: Once | INTRAMUSCULAR | Status: AC
  Administered 2021-01-19: 4 mg via INTRAVENOUS
  Filled 2021-01-19: qty 2

## 2021-01-19 SURGICAL SUPPLY — 60 items
APL SKNCLS STERI-STRIP NONHPOA (GAUZE/BANDAGES/DRESSINGS)
BENZOIN TINCTURE PRP APPL 2/3 (GAUZE/BANDAGES/DRESSINGS) ×1 IMPLANT
BLADE CLIPPER SURG (BLADE) IMPLANT
BLADE SAW SGTL 18X1.27X75 (BLADE) ×2 IMPLANT
BLADE SAW SGTL 18X1.27X75MM (BLADE) ×1
CLOSURE WOUND 1/2 X4 (GAUZE/BANDAGES/DRESSINGS)
COVER SURGICAL LIGHT HANDLE (MISCELLANEOUS) ×3 IMPLANT
COVER WAND RF STERILE (DRAPES) ×3 IMPLANT
DRAPE C-ARM 42X72 X-RAY (DRAPES) ×3 IMPLANT
DRAPE STERI IOBAN 125X83 (DRAPES) ×3 IMPLANT
DRAPE U-SHAPE 47X51 STRL (DRAPES) ×9 IMPLANT
DRESSING AQUACEL AG SP 3.5X10 (GAUZE/BANDAGES/DRESSINGS) IMPLANT
DRSG AQUACEL AG ADV 3.5X10 (GAUZE/BANDAGES/DRESSINGS) ×1 IMPLANT
DRSG AQUACEL AG SP 3.5X10 (GAUZE/BANDAGES/DRESSINGS) ×3
DRSG XEROFORM 1X8 (GAUZE/BANDAGES/DRESSINGS) ×2 IMPLANT
DURAPREP 26ML APPLICATOR (WOUND CARE) ×3 IMPLANT
ELECT BLADE 4.0 EZ CLEAN MEGAD (MISCELLANEOUS)
ELECT BLADE 6.5 EXT (BLADE) ×2 IMPLANT
ELECT REM PT RETURN 9FT ADLT (ELECTROSURGICAL) ×3
ELECTRODE BLDE 4.0 EZ CLN MEGD (MISCELLANEOUS) ×1 IMPLANT
ELECTRODE REM PT RTRN 9FT ADLT (ELECTROSURGICAL) ×1 IMPLANT
FACESHIELD WRAPAROUND (MASK) ×6 IMPLANT
FACESHIELD WRAPAROUND OR TEAM (MASK) ×2 IMPLANT
GLOVE ECLIPSE 8.0 STRL XLNG CF (GLOVE) ×3 IMPLANT
GLOVE ORTHO TXT STRL SZ7.5 (GLOVE) ×6 IMPLANT
GLOVE SRG 8 PF TXTR STRL LF DI (GLOVE) ×2 IMPLANT
GLOVE SURG UNDER POLY LF SZ8 (GLOVE) ×6
GOWN STRL REUS W/ TWL LRG LVL3 (GOWN DISPOSABLE) ×2 IMPLANT
GOWN STRL REUS W/ TWL XL LVL3 (GOWN DISPOSABLE) ×2 IMPLANT
GOWN STRL REUS W/TWL LRG LVL3 (GOWN DISPOSABLE) ×3
GOWN STRL REUS W/TWL XL LVL3 (GOWN DISPOSABLE) ×6
HANDPIECE INTERPULSE COAX TIP (DISPOSABLE) ×3
HEAD M SROM 36MM PLUS 1.5 (Hips) IMPLANT
KIT BASIN OR (CUSTOM PROCEDURE TRAY) ×3 IMPLANT
KIT TURNOVER KIT B (KITS) ×3 IMPLANT
LINER ACETAB NEUTRAL 36ID 520D (Liner) ×2 IMPLANT
MANIFOLD NEPTUNE II (INSTRUMENTS) ×3 IMPLANT
NS IRRIG 1000ML POUR BTL (IV SOLUTION) ×3 IMPLANT
PACK TOTAL JOINT (CUSTOM PROCEDURE TRAY) ×3 IMPLANT
PAD ARMBOARD 7.5X6 YLW CONV (MISCELLANEOUS) ×3 IMPLANT
PIN SECTOR W/GRIP ACE CUP 52MM (Hips) ×2 IMPLANT
SET HNDPC FAN SPRY TIP SCT (DISPOSABLE) ×1 IMPLANT
SROM M HEAD 36MM PLUS 1.5 (Hips) ×3 IMPLANT
STAPLER VISISTAT 35W (STAPLE) ×2 IMPLANT
STEM CORAIL KA12 (Stem) ×2 IMPLANT
STRIP CLOSURE SKIN 1/2X4 (GAUZE/BANDAGES/DRESSINGS) ×2 IMPLANT
SUT ETHIBOND NAB CT1 #1 30IN (SUTURE) ×3 IMPLANT
SUT MNCRL AB 4-0 PS2 18 (SUTURE) IMPLANT
SUT VIC AB 0 CT1 27 (SUTURE) ×3
SUT VIC AB 0 CT1 27XBRD ANBCTR (SUTURE) ×1 IMPLANT
SUT VIC AB 1 CT1 27 (SUTURE) ×3
SUT VIC AB 1 CT1 27XBRD ANBCTR (SUTURE) ×1 IMPLANT
SUT VIC AB 2-0 CT1 27 (SUTURE) ×6
SUT VIC AB 2-0 CT1 TAPERPNT 27 (SUTURE) ×1 IMPLANT
TOWEL GREEN STERILE (TOWEL DISPOSABLE) ×3 IMPLANT
TOWEL GREEN STERILE FF (TOWEL DISPOSABLE) ×3 IMPLANT
TRAY CATH 16FR W/PLASTIC CATH (SET/KITS/TRAYS/PACK) IMPLANT
TRAY FOLEY W/BAG SLVR 16FR (SET/KITS/TRAYS/PACK)
TRAY FOLEY W/BAG SLVR 16FR ST (SET/KITS/TRAYS/PACK) IMPLANT
WATER STERILE IRR 1000ML POUR (IV SOLUTION) ×4 IMPLANT

## 2021-01-19 NOTE — Anesthesia Procedure Notes (Signed)
Procedure Name: Intubation Date/Time: 01/19/2021 9:03 PM Performed by: Claudina Lick, CRNA Pre-anesthesia Checklist: Patient identified, Emergency Drugs available, Suction available, Patient being monitored and Timeout performed Patient Re-evaluated:Patient Re-evaluated prior to induction Oxygen Delivery Method: Circle system utilized Preoxygenation: Pre-oxygenation with 100% oxygen Induction Type: IV induction Ventilation: Mask ventilation without difficulty Laryngoscope Size: Miller and 2 Grade View: Grade I Tube type: Oral Tube size: 7.5 mm Number of attempts: 1 Airway Equipment and Method: Stylet Placement Confirmation: ETT inserted through vocal cords under direct vision,  positive ETCO2 and breath sounds checked- equal and bilateral Secured at: 21 cm Tube secured with: Tape Dental Injury: Teeth and Oropharynx as per pre-operative assessment

## 2021-01-19 NOTE — Transfer of Care (Signed)
Immediate Anesthesia Transfer of Care Note  Patient: NAZLY DIGILIO  Procedure(s) Performed: TOTAL HIP ARTHROPLASTY ANTERIOR APPROACH (Left Hip)  Patient Location: PACU  Anesthesia Type:General  Level of Consciousness: drowsy  Airway & Oxygen Therapy: Patient Spontanous Breathing and Patient connected to face mask oxygen  Post-op Assessment: Report given to RN and Post -op Vital signs reviewed and stable  Post vital signs: Reviewed and stable  Last Vitals:  Vitals Value Taken Time  BP 141/100 01/19/21 2249  Temp    Pulse 99 01/19/21 2250  Resp 22 01/19/21 2250  SpO2 95 % 01/19/21 2250  Vitals shown include unvalidated device data.  Last Pain:  Vitals:   01/19/21 1823  TempSrc: Oral  PainSc:       Patients Stated Pain Goal: 3 (01/19/21 1823)  Complications: No complications documented.

## 2021-01-19 NOTE — Brief Op Note (Signed)
01/19/2021  10:07 PM  PATIENT:  Elizabeth Tucker  75 y.o. female  PRE-OPERATIVE DIAGNOSIS:  closed fracture of left hip  POST-OPERATIVE DIAGNOSIS:  closed fracture of left hip.  PROCEDURE:  Procedure(s): TOTAL HIP ARTHROPLASTY ANTERIOR APPROACH (Left)  SURGEON:  Surgeon(s) and Role:    * Kathryne Hitch, MD - Primary  PHYSICIAN ASSISTANT:  Rexene Edison, PA-C  ANESTHESIA:   general  EBL:  200 mL   COUNTS:  YES  DICTATION: .Other Dictation: Dictation Number [id number]  PLAN OF CARE: Admit to inpatient   PATIENT DISPOSITION:  PACU - hemodynamically stable.   Delay start of Pharmacological VTE agent (>24hrs) due to surgical blood loss or risk of bleeding: no

## 2021-01-19 NOTE — ED Notes (Signed)
RN gave [facility] update to [employer]

## 2021-01-19 NOTE — ED Triage Notes (Signed)
Pt bib gems from [facility] for unwitnessed fall onto tile. No thinners. Unknown LOC. Pt unaware of how she fell but nursing staff picked her up off the ground. Pt c/o left hip pain. Shortening of the L leg. Hx of dementia.   BP: 120/56  HR:80  Spo2: 95% RA  CBG: 139

## 2021-01-19 NOTE — Consult Note (Signed)
Reason for Consult:Left hip fx Referring Physician: Basil DessJ Yates Time called: 0805 Time at bedside: 1008   Elizabeth Tucker is an 75 y.o. female.  HPI: Elizabeth Tucker fell at the ALF where she resides. The fall was unwitnessed and the pt is demented so can't give any history. She was brought to the ED where x-rays showed a left hip fx and orthopedic surgery was consulted. She has no c/o when I saw her.  Past Medical History:  Diagnosis Date  . Alzheimer disease (HCC)   . GERD (gastroesophageal reflux disease)   . Hepatitis   . Hepatitis C     Past Surgical History:  Procedure Laterality Date  . CESAREAN SECTION    . ELBOW SURGERY      Family History  Problem Relation Age of Onset  . Dementia Mother   . Liver disease Brother     Social History:  reports that she has never smoked. She has never used smokeless tobacco. She reports that she does not drink alcohol and does not use drugs.  Allergies:  Allergies  Allergen Reactions  . Sulfonamide Derivatives Other (See Comments)    Childhood allergy     Medications: I have reviewed the patient's current medications.  Results for orders placed or performed during the hospital encounter of 01/19/21 (from the past 48 hour(s))  Basic metabolic panel     Status: Abnormal   Collection Time: 01/19/21  7:02 AM  Result Value Ref Range   Sodium 135 135 - 145 mmol/L   Potassium 3.8 3.5 - 5.1 mmol/L   Chloride 103 98 - 111 mmol/L   CO2 24 22 - 32 mmol/L   Glucose, Bld 122 (H) 70 - 99 mg/dL    Comment: Glucose reference range applies only to samples taken after fasting for at least 8 hours.   BUN 8 8 - 23 mg/dL   Creatinine, Ser 9.600.75 0.44 - 1.00 mg/dL   Calcium 9.2 8.9 - 45.410.3 mg/dL   GFR, Estimated >09>60 >81>60 mL/min    Comment: (NOTE) Calculated using the CKD-EPI Creatinine Equation (2021)    Anion gap 8 5 - 15    Comment: Performed at [facility], [address] [place], [place] [address]  CBC WITH DIFFERENTIAL     Status: Abnormal    Collection Time: 01/19/21  7:02 AM  Result Value Ref Range   WBC 5.1 4.0 - 10.5 K/uL   RBC 4.69 3.87 - 5.11 MIL/uL   Hemoglobin 14.2 12.0 - 15.0 g/dL   HCT 82.941.8 56.236.0 - 13.046.0 %   MCV 89.1 80.0 - 100.0 fL   MCH 30.3 26.0 - 34.0 pg   MCHC 34.0 30.0 - 36.0 g/dL   RDW 86.514.3 78.411.5 - 69.615.5 %   Platelets PLATELET CLUMPS NOTED ON SMEAR, UNABLE TO ESTIMATE 150 - 400 K/uL    Comment: Immature Platelet Fraction may be clinically indicated, consider ordering this additional test [id number]    nRBC 0.0 0.0 - 0.2 %   Neutrophils Relative % 80 %   Neutro Abs 4.1 1.7 - 7.7 K/uL   Lymphocytes Relative 12 %   Lymphs Abs 0.6 (L) 0.7 - 4.0 K/uL   Monocytes Relative 6 %   Monocytes Absolute 0.3 0.1 - 1.0 K/uL   Eosinophils Relative 1 %   Eosinophils Absolute 0.1 0.0 - 0.5 K/uL   Basophils Relative 0 %   Basophils Absolute 0.0 0.0 - 0.1 K/uL   Immature Granulocytes 1 %   Abs Immature Granulocytes  0.03 0.00 - 0.07 K/uL    Comment: Performed at [facility], [address], [place], [place] [address]  Protime-INR     Status: None   Collection Time: 01/19/21  7:02 AM  Result Value Ref Range   Prothrombin Time 14.0 11.4 - 15.2 seconds   INR 1.1 0.8 - 1.2    Comment: (NOTE) INR goal varies based on device and disease states. Performed at [facility], [address], [place], [place] [address]   ABO/Rh     Status: None   Collection Time: 01/19/21  7:02 AM  Result Value Ref Range   ABO/RH(D)      O NEG Performed at [facility], [address] [place], [place] [address]   Resp Panel by RT-PCR (Flu A&B, Covid) Nasopharyngeal Swab     Status: None   Collection Time: 01/19/21  7:38 AM   Specimen: Nasopharyngeal Swab; Nasopharyngeal(NP) swabs in vial transport medium  Result Value Ref Range   SARS Coronavirus 2 by RT PCR NEGATIVE NEGATIVE    Comment: (NOTE) SARS-CoV-2 target nucleic acids are NOT DETECTED.  The SARS-CoV-2 RNA is generally detectable in upper respiratory specimens  during the acute phase of infection. The lowest concentration of SARS-CoV-2 viral copies this assay can detect is 138 copies/mL. A negative result does not preclude SARS-Cov-2 infection and should not be used as the sole basis for treatment or other patient management decisions. A negative result may occur with  improper specimen collection/handling, submission of specimen other than nasopharyngeal swab, presence of viral mutation(s) within the areas targeted by this assay, and inadequate number of viral copies(<138 copies/mL). A negative result must be combined with clinical observations, patient history, and epidemiological information. The expected result is Negative.  Fact Sheet for Patients:  BloggerCourse.com  Fact Sheet for Healthcare Providers:  SeriousBroker.it  This test is no t yet approved or cleared by the [place] FDA and  has been authorized for detection and/or diagnosis of SARS-CoV-2 by FDA under an Emergency Use Authorization (EUA). This EUA will remain  in effect (meaning this test can be used) for the duration of the COVID-19 declaration under Section 564(b)(1) of the Act, 21 U.S.C.section 360bbb-3(b)(1), unless the authorization is terminated  or revoked sooner.       Influenza A by PCR NEGATIVE NEGATIVE   Influenza B by PCR NEGATIVE NEGATIVE    Comment: (NOTE) The Xpert Xpress SARS-CoV-2/FLU/RSV plus assay is intended as an aid in the diagnosis of influenza from Nasopharyngeal swab specimens and should not be used as a sole basis for treatment. Nasal washings and aspirates are unacceptable for Xpert Xpress SARS-CoV-2/FLU/RSV testing.  Fact Sheet for Patients: BloggerCourse.com  Fact Sheet for Healthcare Providers: SeriousBroker.it  This test is not yet approved or cleared by the [place] FDA and has been authorized for detection and/or diagnosis  of SARS-CoV-2 by FDA under an Emergency Use Authorization (EUA). This EUA will remain in effect (meaning this test can be used) for the duration of the COVID-19 declaration under Section 564(b)(1) of the Act, 21 U.S.C. section 360bbb-3(b)(1), unless the authorization is terminated or revoked.  Performed at [facility], [address] [place], [place] [address]   Type and screen [facility]     Status: None   Collection Time: 01/19/21  8:01 AM  Result Value Ref Range   ABO/RH(D) O NEG    Antibody Screen NEG    Sample Expiration  01/22/2021,2359 Performed at [facility], [address], [place], [place] [address]     DG Chest 1 View  Result Date: 01/19/2021 CLINICAL DATA:  Preoperative exam.  Fall. EXAM: CHEST  1 VIEW COMPARISON:  No recent prior. FINDINGS: The heart size and mediastinal contours are within normal limits. Both lungs are clear. No acute bony abnormality. Degenerative changes scoliosis thoracic spine. IMPRESSION: No active disease. Electronically Signed   By: Maisie Fus  Register   On: 01/19/2021 07:36   CT HEAD WO CONTRAST  Result Date: 01/19/2021 CLINICAL DATA:  75 year old female with history of trauma from a fall with injury to the head. EXAM: CT HEAD WITHOUT CONTRAST CT CERVICAL SPINE WITHOUT CONTRAST TECHNIQUE: Multidetector CT imaging of the head and cervical spine was performed following the standard protocol without intravenous contrast. Multiplanar CT image reconstructions of the cervical spine were also generated. COMPARISON:  No priors. FINDINGS: CT HEAD FINDINGS Brain: Very mild cerebral atrophy. Patchy areas of mild decreased attenuation are noted throughout the periventricular white matter of the cerebral hemispheres bilaterally, compatible with chronic microvascular ischemic disease. No evidence of acute infarction, hemorrhage, hydrocephalus, extra-axial collection or mass lesion/mass effect. Vascular: No hyperdense vessel or  unexpected calcification. Skull: Normal. Negative for fracture or focal lesion. Sinuses/Orbits: No acute finding. Other: None. CT CERVICAL SPINE FINDINGS Alignment: Normal. Skull base and vertebrae: No acute fracture. No primary bone lesion or focal pathologic process. Soft tissues and spinal canal: No prevertebral fluid or swelling. No visible canal hematoma. Disc levels: Multilevel degenerative disc disease, most pronounced at C6-C7 and C7-T1. Mild multilevel facet arthropathy. Upper chest: Unremarkable. Other: None. IMPRESSION: 1. No evidence of significant acute traumatic injury to the skull, brain or cervical spine. 2. Very mild cerebral atrophy and chronic microvascular ischemic changes in the periventricular white matter of the cerebral hemispheres. 3. Multilevel degenerative disc disease and cervical spondylosis, as above. Electronically Signed   By: Trudie Reed M.D.   On: 01/19/2021 07:48   CT CERVICAL SPINE WO CONTRAST  Result Date: 01/19/2021 CLINICAL DATA:  75 year old female with history of trauma from a fall with injury to the head. EXAM: CT HEAD WITHOUT CONTRAST CT CERVICAL SPINE WITHOUT CONTRAST TECHNIQUE: Multidetector CT imaging of the head and cervical spine was performed following the standard protocol without intravenous contrast. Multiplanar CT image reconstructions of the cervical spine were also generated. COMPARISON:  No priors. FINDINGS: CT HEAD FINDINGS Brain: Very mild cerebral atrophy. Patchy areas of mild decreased attenuation are noted throughout the periventricular white matter of the cerebral hemispheres bilaterally, compatible with chronic microvascular ischemic disease. No evidence of acute infarction, hemorrhage, hydrocephalus, extra-axial collection or mass lesion/mass effect. Vascular: No hyperdense vessel or unexpected calcification. Skull: Normal. Negative for fracture or focal lesion. Sinuses/Orbits: No acute finding. Other: None. CT CERVICAL SPINE FINDINGS  Alignment: Normal. Skull base and vertebrae: No acute fracture. No primary bone lesion or focal pathologic process. Soft tissues and spinal canal: No prevertebral fluid or swelling. No visible canal hematoma. Disc levels: Multilevel degenerative disc disease, most pronounced at C6-C7 and C7-T1. Mild multilevel facet arthropathy. Upper chest: Unremarkable. Other: None. IMPRESSION: 1. No evidence of significant acute traumatic injury to the skull, brain or cervical spine. 2. Very mild cerebral atrophy and chronic microvascular ischemic changes in the periventricular white matter of the cerebral hemispheres. 3. Multilevel degenerative disc disease and cervical spondylosis, as above. Electronically Signed   By: Trudie Reed M.D.   On: 01/19/2021 07:48   DG Hip Unilat With Pelvis  2-3 Views Left  Result Date: 01/19/2021 CLINICAL DATA:  75 year old female with left side hip pain and rotation. Possible fracture. Preoperative exam. EXAM: DG HIP (WITH OR WITHOUT PELVIS) 2-3V LEFT COMPARISON:  None. FINDINGS: Left femoral head is normally located. But a left femoral neck fracture is visible on image 3 with mild impaction. The left intertrochanteric segment and proximal left femoral shaft remain intact. No pelvis fracture. SI joints appear normal. Grossly intact proximal right femur. Negative visible bowel gas pattern.  Incidental pelvic phleboliths. IMPRESSION: Positive for left femoral neck fracture with mild impaction. Electronically Signed   By: Odessa Fleming M.D.   On: 01/19/2021 07:43    Review of Systems  Unable to perform ROS: Dementia   Blood pressure 111/77, pulse 87, temperature 98.1 F (36.7 C), temperature source Oral, resp. rate 14, height 5\' 5"  (1.651 m), weight 100.7 kg, SpO2 96 %. Physical Exam Constitutional:      General: She is not in acute distress.    Appearance: She is well-developed. She is not diaphoretic.  HENT:     Head: Normocephalic and atraumatic.  Eyes:     General: No scleral  icterus.       Right eye: No discharge.        Left eye: No discharge.     Conjunctiva/sclera: Conjunctivae normal.  Cardiovascular:     Rate and Rhythm: Normal rate and regular rhythm.  Pulmonary:     Effort: Pulmonary effort is normal. No respiratory distress.  Musculoskeletal:     Cervical back: Normal range of motion.     Comments: LLE No traumatic wounds, ecchymosis, or rash  Nontender  No knee or ankle effusion  Knee stable to varus/ valgus and anterior/posterior stress  Sens DPN, SPN, TN intact  Motor EHL, ext, flex, evers 5/5  DP 2+, PT 1+, No significant edema  Skin:    General: Skin is warm and dry.  Neurological:     Mental Status: She is alert.  Psychiatric:        Behavior: Behavior normal.     Assessment/Plan: Left hip fx -- Plan THA today by Dr. if we can get consent. Dementia    Magnus Ivan, PA-C Orthopedic Surgery [phone] 01/19/2021, 10:14 AM

## 2021-01-19 NOTE — ED Provider Notes (Signed)
[facility] EMERGENCY DEPARTMENT Provider Note   CSN: [id number] Arrival date & time: 01/19/21  5852     History Chief Complaint  Patient presents with  . Fall    Elizabeth Tucker is a 75 y.o. female.  Patient with history of Alzheimer's dementia presents from her facility today after an unwitnessed fall.  She currently resides at [facility].  She fell onto tile per EMS report.  It is unknown if she lost consciousness or hit her head.  She was placed in a cervical collar.  Patient complains of pain in her left hip.  Denies other complaints currently.  She is at her baseline mental status per her report.  Level 5 caveat due to dementia.        Past Medical History:  Diagnosis Date  . Alzheimer disease (HCC)   . GERD (gastroesophageal reflux disease)   . Hepatitis   . Hepatitis C   . Schizophrenia [facility])     Patient Active Problem List   Diagnosis Date Noted  . CHOLELITHIASIS, ASYMPTOMATIC 12/05/2009  . DEPRESSION 11/10/2009  . WEIGHT GAIN, ABNORMAL 11/10/2009  . HEPATITIS C, CHRONIC 02/14/2008  . ANA POSITIVE 02/14/2008  . ANKLE EDEMA 02/01/2008  . PETECHIAE 02/01/2008  . HYPOGLYCEMIA 05/11/2007  . MOURNING 05/11/2007  . INSOMNIA 05/11/2007  . INTERNAL HEMORRHOIDS 11/01/1999    Past Surgical History:  Procedure Laterality Date  . CESAREAN SECTION    . ELBOW SURGERY       OB History   No obstetric history on file.     Family History  Problem Relation Age of Onset  . Dementia Mother   . Liver disease Brother     Social History   Tobacco Use  . Smoking status: Never Smoker  . Smokeless tobacco: Never Used  Substance Use Topics  . Alcohol use: No  . Drug use: No    Home Medications Prior to Admission medications   Medication Sig Start Date End Date Taking? Authorizing Provider  ascorbic acid (VITAMIN C) 500 MG tablet Take 1 tablet (500 mg total) by mouth 2 (two) times daily. 07/15/20   Ngetich, Dinah C, NP  aspirin EC 81 MG  tablet Take 81 mg by mouth daily.    [provider]  diclofenac Sodium (VOLTAREN) 1 % GEL Apply 4 g topically 4 (four) times daily.    [provider]  guaifenesin (ROBITUSSIN) 100 MG/5ML syrup Take 200 mg by mouth every 6 (six) hours as needed for cough.    [provider]  haloperidol (HALDOL) 5 MG tablet Take 5 mg by mouth every 6 (six) hours as needed for agitation.    [provider]  magnesium hydroxide (MILK OF MAGNESIA) 400 MG/5ML suspension Take 30 mLs by mouth at bedtime as needed for mild constipation.    [provider]  magnesium oxide (MAG-OX) 400 (241.3 Mg) MG tablet Take 400 mg by mouth daily.    [provider]  miconazole (MICOTIN) 2 % cream Apply 1 application topically 3 (three) times daily as needed.    [provider]  Neomycin-Bacitracin-Polymyxin (TRIPLE ANTIBIOTIC) [phone] [id number] OINT Apply 1 application topically as needed.    [provider]  nitroGLYCERIN (NITROSTAT) 0.4 MG SL tablet Place 0.4 mg under the tongue every 5 (five) minutes as needed for chest pain.    [provider]  omeprazole (PRILOSEC) 40 MG capsule Take 40 mg by mouth daily.    [provider]  POTASSIUM PO Take 1  tablet by mouth daily.    [provider]  vitamin B-12 (CYANOCOBALAMIN) 1000 MCG tablet Take 1,000 mcg by mouth daily.    [provider]    Allergies    Sulfonamide derivatives  Review of Systems   Review of Systems  Unable to perform ROS: Dementia    Physical Exam Updated Vital Signs Temp 97.6 F (36.4 C) (Oral)   Ht 5\' 5"  (1.651 m)   Wt 100.7 kg   BMI 36.94 kg/m   Physical Exam Vitals and nursing note reviewed.  Constitutional:      General: She is not in acute distress.    Appearance: She is well-developed.  HENT:     Head: Normocephalic and atraumatic.     Right Ear: External ear normal.     Left Ear: External ear normal.     Nose: Nose normal.  Eyes:      Conjunctiva/sclera: Conjunctivae normal.  Cardiovascular:     Rate and Rhythm: Normal rate and regular rhythm.     Pulses:          Radial pulses are 2+ on the right side and 2+ on the left side.       Dorsalis pedis pulses are 2+ on the right side and 2+ on the left side.     Heart sounds: No murmur heard.   Pulmonary:     Effort: No respiratory distress.     Breath sounds: No wheezing, rhonchi or rales.  Abdominal:     Palpations: Abdomen is soft.     Tenderness: There is no abdominal tenderness. There is no guarding or rebound.  Musculoskeletal:     Cervical back: Normal range of motion and neck supple.     Right hip: No tenderness or bony tenderness.     Left hip: Tenderness and bony tenderness present.     Right upper leg: No tenderness.     Left upper leg: No tenderness.     Right knee: No tenderness.     Left knee: No tenderness.     Right lower leg: No edema.     Left lower leg: No edema.     Comments: Patient is able to wiggle her toes but reports pain in the hip when she does so.  Minimal shortening, slight external rotation of the left leg.  Skin:    General: Skin is warm and dry.     Findings: No rash.  Neurological:     General: No focal deficit present.     Mental Status: She is alert. Mental status is at baseline.     Motor: No weakness.  Psychiatric:        Mood and Affect: Mood normal.     ED Results / Procedures / Treatments   Labs (all labs ordered are listed, but only abnormal results are displayed) Labs Reviewed  BASIC METABOLIC PANEL - Abnormal; Notable for the following components:      Result Value   Glucose, Bld 122 (*)    All other components within normal limits  CBC WITH DIFFERENTIAL/PLATELET - Abnormal; Notable for the following components:   Lymphs Abs 0.6 (*)    All other components within normal limits  RESP PANEL BY RT-PCR (FLU A&B, COVID) [id number]  PROTIME-INR  URINALYSIS, ROUTINE W REFLEX MICROSCOPIC  TYPE AND SCREEN  ABO/RH   TROPONIN I (HIGH SENSITIVITY)    ED ECG REPORT   Date: 01/19/2021  Rate: 91  Rhythm: normal sinus rhythm, unifocal PVCs  QRS  Axis: normal  Intervals: normal  ST/T Wave abnormalities: nonspecific ST changes  Conduction Disutrbances:none  Narrative Interpretation:   Old EKG Reviewed: none available  I have personally reviewed the EKG tracing.  Radiology DG Chest 1 View  Result Date: 01/19/2021 CLINICAL DATA:  Preoperative exam.  Fall. EXAM: CHEST  1 VIEW COMPARISON:  No recent prior. FINDINGS: The heart size and mediastinal contours are within normal limits. Both lungs are clear. No acute bony abnormality. Degenerative changes scoliosis thoracic spine. IMPRESSION: No active disease. Electronically Signed   By: Maisie Fus  Register   On: 01/19/2021 07:36   CT HEAD WO CONTRAST  Result Date: 01/19/2021 CLINICAL DATA:  75 year old female with history of trauma from a fall with injury to the head. EXAM: CT HEAD WITHOUT CONTRAST CT CERVICAL SPINE WITHOUT CONTRAST TECHNIQUE: Multidetector CT imaging of the head and cervical spine was performed following the standard protocol without intravenous contrast. Multiplanar CT image reconstructions of the cervical spine were also generated. COMPARISON:  No priors. FINDINGS: CT HEAD FINDINGS Brain: Very mild cerebral atrophy. Patchy areas of mild decreased attenuation are noted throughout the periventricular white matter of the cerebral hemispheres bilaterally, compatible with chronic microvascular ischemic disease. No evidence of acute infarction, hemorrhage, hydrocephalus, extra-axial collection or mass lesion/mass effect. Vascular: No hyperdense vessel or unexpected calcification. Skull: Normal. Negative for fracture or focal lesion. Sinuses/Orbits: No acute finding. Other: None. CT CERVICAL SPINE FINDINGS Alignment: Normal. Skull base and vertebrae: No acute fracture. No primary bone lesion or focal pathologic process. Soft tissues and spinal canal: No  prevertebral fluid or swelling. No visible canal hematoma. Disc levels: Multilevel degenerative disc disease, most pronounced at C6-C7 and C7-T1. Mild multilevel facet arthropathy. Upper chest: Unremarkable. Other: None. IMPRESSION: 1. No evidence of significant acute traumatic injury to the skull, brain or cervical spine. 2. Very mild cerebral atrophy and chronic microvascular ischemic changes in the periventricular white matter of the cerebral hemispheres. 3. Multilevel degenerative disc disease and cervical spondylosis, as above. Electronically Signed   By: Trudie Reed M.D.   On: 01/19/2021 07:48   CT CERVICAL SPINE WO CONTRAST  Result Date: 01/19/2021 CLINICAL DATA:  75 year old female with history of trauma from a fall with injury to the head. EXAM: CT HEAD WITHOUT CONTRAST CT CERVICAL SPINE WITHOUT CONTRAST TECHNIQUE: Multidetector CT imaging of the head and cervical spine was performed following the standard protocol without intravenous contrast. Multiplanar CT image reconstructions of the cervical spine were also generated. COMPARISON:  No priors. FINDINGS: CT HEAD FINDINGS Brain: Very mild cerebral atrophy. Patchy areas of mild decreased attenuation are noted throughout the periventricular white matter of the cerebral hemispheres bilaterally, compatible with chronic microvascular ischemic disease. No evidence of acute infarction, hemorrhage, hydrocephalus, extra-axial collection or mass lesion/mass effect. Vascular: No hyperdense vessel or unexpected calcification. Skull: Normal. Negative for fracture or focal lesion. Sinuses/Orbits: No acute finding. Other: None. CT CERVICAL SPINE FINDINGS Alignment: Normal. Skull base and vertebrae: No acute fracture. No primary bone lesion or focal pathologic process. Soft tissues and spinal canal: No prevertebral fluid or swelling. No visible canal hematoma. Disc levels: Multilevel degenerative disc disease, most pronounced at C6-C7 and C7-T1. Mild multilevel  facet arthropathy. Upper chest: Unremarkable. Other: None. IMPRESSION: 1. No evidence of significant acute traumatic injury to the skull, brain or cervical spine. 2. Very mild cerebral atrophy and chronic microvascular ischemic changes in the periventricular white matter of the cerebral hemispheres. 3. Multilevel degenerative disc disease and cervical spondylosis, as above. Electronically Signed  By: Trudie Reed M.D.   On: 01/19/2021 07:48   DG Hip Unilat With Pelvis 2-3 Views Left  Result Date: 01/19/2021 CLINICAL DATA:  75 year old female with left side hip pain and rotation. Possible fracture. Preoperative exam. EXAM: DG HIP (WITH OR WITHOUT PELVIS) 2-3V LEFT COMPARISON:  None. FINDINGS: Left femoral head is normally located. But a left femoral neck fracture is visible on image 3 with mild impaction. The left intertrochanteric segment and proximal left femoral shaft remain intact. No pelvis fracture. SI joints appear normal. Grossly intact proximal right femur. Negative visible bowel gas pattern.  Incidental pelvic phleboliths. IMPRESSION: Positive for left femoral neck fracture with mild impaction. Electronically Signed   By: Odessa Fleming M.D.   On: 01/19/2021 07:43    Procedures Procedures   Medications Ordered in ED Medications  fentaNYL (SUBLIMAZE) injection 50 mcg (50 mcg Intravenous Given 01/19/21 0755)  ondansetron (ZOFRAN) injection 4 mg (4 mg Intravenous Given 01/19/21 0755)    ED Course  I have reviewed the triage vital signs and the nursing notes.  Pertinent labs & imaging results that were available during my care of the patient were reviewed by me and considered in my medical decision making (see chart for details).  Patient seen and examined. Work-up initiated. Medications ordered.   Vital signs reviewed and are as follows: Temp 97.6 F (36.4 C) (Oral)   Ht 5\' 5"  (1.651 m)   Wt 100.7 kg   BMI 36.94 kg/m   8:03 AM + left femoral neck fracture.  C-collar removed by  myself.  Patient updated.  Attempted to call patient's emergency contact, , no answer, voicemail box full.  9:07 AM Spoke with ortho for consult. Awaiting reccs.   Spoke with Dr. Scheryl Darter of [facility] who will see for admission.     MDM Rules/Calculators/A&P                          Admit.   Final Clinical Impression(s) / ED Diagnoses Final diagnoses:  Closed fracture of left hip, initial encounter [facility])  Fall, initial encounter    Rx / DC Orders ED Discharge Orders    None       [facility], PA-C 01/19/21 01/21/21    [id number], MD 01/23/21 [phone]

## 2021-01-19 NOTE — OR Nursing (Signed)
Patients earrings placed in specimen cup and specimen cup secured close with patient sticker and then taken to PACU with patient.

## 2021-01-19 NOTE — H&P (Signed)
History and Physical    Elizabeth Tucker SWH:[id number] DOB: [birth year] DOA: 01/19/2021  PCP: Sandrea Hughs, NP Consultants:  None Patient coming from: [facility]; NOK: Claris Gower, [phone]; Albertine Grates, [phone], [phone]  Chief Complaint: Fall  HPI: Elizabeth Tucker is a 75 y.o. female with medical history significant of dementia presenting with an unwitnessed fall.  The patient lives in [place].  She is currently oriented x 3 but is unable to provide history of the event, thinks she is in the hospital for a physical.  Unable to reach family, message left on work voice mail.    ED Course:  L hip fracture from an unwitnessed fall.  Found down, uncertain how she fell.  Ortho to see.  Review of Systems: As per HPI; otherwise review of systems reviewed and negative. Inaccurate due to dementia.   COVID Vaccine Status:  Complete  Past Medical History:  Diagnosis Date  . Alzheimer disease ([place])   . GERD (gastroesophageal reflux disease)   . Hepatitis   . Hepatitis C     Past Surgical History:  Procedure Laterality Date  . CESAREAN SECTION    . ELBOW SURGERY      Social History   Socioeconomic History  . Marital status: Single    Spouse name: Not on file  . Number of children: Not on file  . Years of education: Not on file  . Highest education level: Not on file  Occupational History  . Not on file  Tobacco Use  . Smoking status: Never Smoker  . Smokeless tobacco: Never Used  Substance and Sexual Activity  . Alcohol use: No  . Drug use: No  . Sexual activity: Not on file  Other Topics Concern  . Not on file  Social History Narrative   Tobacco use, amount per day now: past smoker, not now.   Past tobacco use, amount per day:   How many years did you use tobacco:   Alcohol use (drinks per week): casual alcohol use, sparse.   Diet:   Do you drink/eat things with caffeine: Yes, not often.   Marital status:   Previously  married                               What year were you married?   Do you live in a house, apartment, assisted living, condo, trailer, etc.? Assisted living facility, [facility].   Is it one or more stories? 4 stories.   How many persons live in your home? N/a other residents, but stays in single room.   Do you have pets in your home?( please list) n/a.   Highest Level of education completed? High School.   Current or past profession: Dance movement psychotherapist, security guard.   Do you exercise?                                  Type and how often?   Do you have a living will? No.   Do you have a DNR form?  Yes                                 If not, do you want to discuss one?   Do you have signed POA/HPOA forms?  If so, please bring to you appointment      Do you have any difficulty bathing or dressing yourself? No   Do you have any difficulty preparing food or eating? No   Do you have any difficulty managing your medications?   Do you have any difficulty managing your finances?   Do you have any difficulty affording your medications?    Social Determinants of Health   Financial Resource Strain: Not on file  Food Insecurity: Not on file  Transportation Needs: Not on file  Physical Activity: Not on file  Stress: Not on file  Social Connections: Not on file  Intimate Partner Violence: Not on file    Allergies  Allergen Reactions  . Sulfonamide Derivatives Other (See Comments)    Childhood allergy     Family History  Problem Relation Age of Onset  . Dementia Mother   . Liver disease Brother     Prior to Admission medications   Medication Sig Start Date End Date Taking? Authorizing Provider  acetaminophen (TYLENOL) 500 MG tablet Take 500 mg by mouth every 6 (six) hours as needed for moderate pain, fever or headache.   Yes [provider]  aluminum-magnesium hydroxide-simethicone (MAALOX) 200-200-20 MG/5ML SUSP Take 30 mLs by mouth every 6 (six) hours  as needed (heartburn/indigestion).   Yes [provider]  guaifenesin (ROBITUSSIN) 100 MG/5ML syrup Take 200 mg by mouth every 6 (six) hours as needed for cough.   Yes [provider]  haloperidol (HALDOL) 5 MG tablet Take 5 mg by mouth every 6 (six) hours as needed for agitation.   Yes [provider]  loperamide (IMODIUM) 2 MG capsule Take 2 mg by mouth See admin instructions. 1 capsule prn with each loose stool/diarrhea. max 8 capsules in 24 hours   Yes [provider]  LORazepam (ATIVAN) 0.5 MG tablet Take 0.5 mg by mouth 2 (two) times daily.   Yes [provider]  LORazepam (ATIVAN) 1 MG tablet Take 1 mg by mouth every 6 (six) hours as needed for anxiety.   Yes [provider]  magnesium hydroxide (MILK OF MAGNESIA) 400 MG/5ML suspension Take 30 mLs by mouth at bedtime as needed for mild constipation.   Yes [provider]  Neomycin-Bacitracin-Polymyxin (TRIPLE ANTIBIOTIC) [phone] OINT Apply 1 application topically as needed (skin tears/abraisons).   Yes [provider]  omeprazole (PRILOSEC) 40 MG capsule Take 40 mg by mouth daily.   Yes [provider]  traMADol (ULTRAM) 50 MG tablet Take 50 mg by mouth See admin instructions. Bid x 7 days   Yes [provider]  traZODone (DESYREL) 50 MG tablet Take 50 mg by mouth at bedtime.   Yes [provider]  ziprasidone (GEODON) 40 MG capsule Take 40 mg by mouth daily.   Yes [provider]  ziprasidone (GEODON) 20 MG capsule Take 20 mg by mouth at bedtime. Patient not taking: Reported on 01/19/2021    [provider]    Physical Exam: Vitals:   01/19/21 0830 01/19/21 0856 01/19/21 0930 01/19/21 1000  BP: 92/61  101/69 111/77  Pulse: 87     Resp: _0 Temp:  98.1 F (36.7 C)    TempSrc:  Oral    SpO2: 96%     Weight:      Height:         . General:  Appears calm and comfortable and is in NAD; hip pain with  movement . Eyes:  EOMI, normal lids, iris . ENT: hard of hearing,  grossly normal lips & tongue, mmm . Neck:  no LAD, masses or thyromegaly . Cardiovascular:  RRR, no m/r/g. No LE edema.  Marland Kitchen Respiratory:   CTA bilaterally with no wheezes/rales/rhonchi.  Normal respiratory effort.   . Abdomen:  soft, NT, ND, NABS . Skin:  no rash or induration seen on limited exam . Musculoskeletal:  L leg is shortened and externally rotated . Lower extremity:  No LE edema.  Limited foot exam with no ulcerations.  2+ distal pulses. Marland Kitchen Psychiatric:  grossly normal mood and affect, speech fluent but inappropriate but to mild cognitive impairment, A&O x 3 . Neurologic:  CN 2-12 grossly intact, moves all extremities in coordinated fashion other than LLE   Radiological Exams on Admission: Independently reviewed - see discussion in A/P where applicable  DG Chest 1 View  Result Date: 01/19/2021 CLINICAL DATA:  Preoperative exam.  Fall. EXAM: CHEST  1 VIEW COMPARISON:  No recent prior. FINDINGS: The heart size and mediastinal contours are within normal limits. Both lungs are clear. No acute bony abnormality. Degenerative changes scoliosis thoracic spine. IMPRESSION: No active disease. Electronically Signed   By: Marcello Moores  Register   On: 01/19/2021 07:36   CT HEAD WO CONTRAST  Result Date: 01/19/2021 CLINICAL DATA:  75 year old female with history of trauma from a fall with injury to the head. EXAM: CT HEAD WITHOUT CONTRAST CT CERVICAL SPINE WITHOUT CONTRAST TECHNIQUE: Multidetector CT imaging of the head and cervical spine was performed following the standard protocol without intravenous contrast. Multiplanar CT image reconstructions of the cervical spine were also generated. COMPARISON:  No priors. FINDINGS: CT HEAD FINDINGS Brain: Very mild cerebral atrophy. Patchy areas of mild decreased attenuation are noted throughout the periventricular white matter of the cerebral hemispheres bilaterally, compatible with chronic  microvascular ischemic disease. No evidence of acute infarction, hemorrhage, hydrocephalus, extra-axial collection or mass lesion/mass effect. Vascular: No hyperdense vessel or unexpected calcification. Skull: Normal. Negative for fracture or focal lesion. Sinuses/Orbits: No acute finding. Other: None. CT CERVICAL SPINE FINDINGS Alignment: Normal. Skull base and vertebrae: No acute fracture. No primary bone lesion or focal pathologic process. Soft tissues and spinal canal: No prevertebral fluid or swelling. No visible canal hematoma. Disc levels: Multilevel degenerative disc disease, most pronounced at C6-C7 and C7-T1. Mild multilevel facet arthropathy. Upper chest: Unremarkable. Other: None. IMPRESSION: 1. No evidence of significant acute traumatic injury to the skull, brain or cervical spine. 2. Very mild cerebral atrophy and chronic microvascular ischemic changes in the periventricular white matter of the cerebral hemispheres. 3. Multilevel degenerative disc disease and cervical spondylosis, as above. Electronically Signed   By: Vinnie Langton M.D.   On: 01/19/2021 07:48   CT CERVICAL SPINE WO CONTRAST  Result Date: 01/19/2021 CLINICAL DATA:  75 year old female with history of trauma from a fall with injury to the head. EXAM: CT HEAD WITHOUT CONTRAST CT CERVICAL SPINE WITHOUT CONTRAST TECHNIQUE: Multidetector CT imaging of the head and cervical spine was performed following the standard protocol without intravenous contrast. Multiplanar CT image reconstructions of the cervical spine were also generated. COMPARISON:  No priors. FINDINGS: CT HEAD FINDINGS Brain: Very mild cerebral atrophy. Patchy areas of mild decreased attenuation are noted throughout the periventricular white matter of the cerebral hemispheres bilaterally, compatible with chronic microvascular ischemic disease. No evidence of acute infarction, hemorrhage, hydrocephalus, extra-axial collection or mass lesion/mass effect. Vascular: No  hyperdense vessel or unexpected calcification. Skull: Normal. Negative for fracture or focal  lesion. Sinuses/Orbits: No acute finding. Other: None. CT CERVICAL SPINE FINDINGS Alignment: Normal. Skull base and vertebrae: No acute fracture. No primary bone lesion or focal pathologic process. Soft tissues and spinal canal: No prevertebral fluid or swelling. No visible canal hematoma. Disc levels: Multilevel degenerative disc disease, most pronounced at C6-C7 and C7-T1. Mild multilevel facet arthropathy. Upper chest: Unremarkable. Other: None. IMPRESSION: 1. No evidence of significant acute traumatic injury to the skull, brain or cervical spine. 2. Very mild cerebral atrophy and chronic microvascular ischemic changes in the periventricular white matter of the cerebral hemispheres. 3. Multilevel degenerative disc disease and cervical spondylosis, as above. Electronically Signed   By: Vinnie Langton M.D.   On: 01/19/2021 07:48   DG Hip Unilat With Pelvis 2-3 Views Left  Result Date: 01/19/2021 CLINICAL DATA:  75 year old female with left side hip pain and rotation. Possible fracture. Preoperative exam. EXAM: DG HIP (WITH OR WITHOUT PELVIS) 2-3V LEFT COMPARISON:  None. FINDINGS: Left femoral head is normally located. But a left femoral neck fracture is visible on image 3 with mild impaction. The left intertrochanteric segment and proximal left femoral shaft remain intact. No pelvis fracture. SI joints appear normal. Grossly intact proximal right femur. Negative visible bowel gas pattern.  Incidental pelvic phleboliths. IMPRESSION: Positive for left femoral neck fracture with mild impaction. Electronically Signed   By: Genevie Ann M.D.   On: 01/19/2021 07:43    EKG: Independently reviewed.  NSR with rate 91; PVCs; nonspecific ST changes with no evidence of acute ischemia   Labs on Admission: I have personally reviewed the available labs and imaging studies at the time of the admission.  Pertinent labs:   Glucose  122 Normal CBC INR 1.1   Assessment/Plan Principal Problem:   Closed left hip fracture, initial encounter (HCC) Active Problems:   Dementia with behavioral disturbance (HCC)   Class 2 obesity due to excess calories with body mass index (BMI) of 36.0 to 36.9 in adult    Hip fracture -Mechanical fall resulting in hip fracture (presumed, unwitnessed) -Orthopedics consulted -NPO in anticipation of surgical repair  -SCDs overnight, start Lovenox post-operatively (or as per ortho) -Pain control with Robxain, Vicodin, and Morphine prn -TOC team consult for rehab placement -Will need PT consult post-operatively -Hip fracture order set utilized  Dementia -Continue behavioral medications - Haldol, Ativan, Trazodone, Geodon -Of note, also with reported h/o schizophrenia; however, at neuropsychiatric testing in 08/2020 this was recommended to be removed from her history because she has not met criteria  Obesity -Body mass index is 36.94 kg/m..  -Weight loss should be encouraged -Outpatient PCP/bariatric medicine f/u encouraged    Note: This patient has been tested and is negative for the novel coronavirus COVID-19. The patient has been fully vaccinated against COVID-19.   DVT prophylaxis:  SCDs until approved for Lovenox by orthopedics Code Status:  Full - unable to confirm Family Communication: None present; unable to reach family by telephone, voice mail message left for goddaughter but unable to leave message for sister  Disposition Plan:  SNF once clinically improved Consults called: Orthopedics; SW, Nutrition; will need PT post-operatively  Admission status: Admit - It is my clinical opinion that admission to INPATIENT is reasonable and necessary because of the expectation that this patient will require hospital care that crosses at least 2 midnights to treat this condition based on the medical complexity of the problems presented.  Given the aforementioned information, the  predictability of an adverse outcome is felt to  be significant.     Karmen Bongo MD [facility]   How to contact the [facility] Attending or Consulting provider [place] or covering provider during after hours [place], for this patient?  1. Check the care team in [facility] and look for a) attending/consulting [facility] provider listed and b) the [facility] team listed 2. Log into [email or web address] and use [place]'s universal password to access. If you do not have the password, please contact the hospital operator. 3. Locate the [facility] provider you are looking for under [facility] and page to a number that you can be directly reached. 4. If you still have difficulty reaching the provider, please page the [facility] (Director on Call) for the Hospitalists listed on amion for assistance.   01/19/2021, 10:12 AM

## 2021-01-19 NOTE — Anesthesia Preprocedure Evaluation (Signed)
Anesthesia Evaluation  Patient identified by MRN, date of birth, ID bandGeneral Assessment Comment:sleeping  Reviewed: Allergy & Precautions, NPO status , Patient's Chart, lab work & pertinent test results  History of Anesthesia Complications Negative for: history of anesthetic complications  Airway Mallampati: II  TM Distance: >3 FB Neck ROM: Full    Dental  (+) Edentulous Upper, Edentulous Lower   Pulmonary  Covid-19 Nucleic Acid Test Results Lab Results      Component                Value               Date                      SARSCOV2NAA              NEGATIVE            01/19/2021              breath sounds clear to auscultation       Cardiovascular negative cardio ROS   Rhythm:Regular     Neuro/Psych PSYCHIATRIC DISORDERS Depression Dementia negative neurological ROS     GI/Hepatic GERD  ,(+) Hepatitis -Lab Results      Component                Value               Date                      ALT                      12                  11/24/2020                AST                      17                  11/24/2020                ALKPHOS                  108                 02/01/2008                BILITOT                  0.9                 11/24/2020              Endo/Other  negative endocrine ROS  Renal/GU negative Renal ROSLab Results      Component                Value               Date                      CREATININE               0.75                01/19/2021                Musculoskeletal  closed fracture of left hip   Abdominal   Peds  Hematology negative hematology ROS (+) Lab Results      Component                Value               Date                      WBC                      5.1                 01/19/2021                HGB                      14.2                01/19/2021                HCT                      41.8                01/19/2021                MCV                       89.1                01/19/2021                PLT                                          01/19/2021            PLATELET CLUMPS NOTED ON SMEAR, UNABLE TO ESTIMATE    Anesthesia Other Findings   Reproductive/Obstetrics                             Anesthesia Physical Anesthesia Plan  ASA: III  Anesthesia Plan: General   Post-op Pain Management:    Induction: Intravenous  PONV Risk Score and Plan: 3 and Ondansetron and Dexamethasone  Airway Management Planned: Oral ETT  Additional Equipment: None  Intra-op Plan:   Post-operative Plan: Extubation in OR  Informed Consent:     History available from chart only  Plan Discussed with: CRNA and Surgeon  Anesthesia Plan Comments:         Anesthesia Quick Evaluation

## 2021-01-20 ENCOUNTER — Encounter (HOSPITAL_COMMUNITY): Payer: Self-pay | Admitting: Orthopaedic Surgery

## 2021-01-20 DIAGNOSIS — F0151 Vascular dementia with behavioral disturbance: Secondary | ICD-10-CM

## 2021-01-20 DIAGNOSIS — Z6836 Body mass index (BMI) 36.0-36.9, adult: Secondary | ICD-10-CM

## 2021-01-20 DIAGNOSIS — W19XXXA Unspecified fall, initial encounter: Secondary | ICD-10-CM

## 2021-01-20 LAB — BASIC METABOLIC PANEL
Anion gap: 11 (ref 5–15)
BUN: 10 mg/dL (ref 8–23)
CO2: 23 mmol/L (ref 22–32)
Calcium: 8.7 mg/dL — ABNORMAL LOW (ref 8.9–10.3)
Chloride: 101 mmol/L (ref 98–111)
Creatinine, Ser: 0.71 mg/dL (ref 0.44–1.00)
GFR, Estimated: >60 mL/min (ref >60)
Glucose, Bld: 122 mg/dL — ABNORMAL HIGH (ref 70–99)
Potassium: 4 mmol/L (ref 3.5–5.1)
Sodium: 135 mmol/L (ref 135–145)

## 2021-01-20 LAB — CBC
HCT: 38.6 % (ref 36.0–46.0)
Hemoglobin: 13.3 g/dL (ref 12.0–15.0)
MCH: 30.4 pg (ref 26.0–34.0)
MCHC: 34.5 g/dL (ref 30.0–36.0)
MCV: 88.3 fL (ref 80.0–100.0)
Platelets: 104 10*3/uL — ABNORMAL LOW (ref 150–400)
RBC: 4.37 MIL/uL (ref 3.87–5.11)
RDW: 14.4 % (ref 11.5–15.5)
WBC: 8 10*3/uL (ref 4.0–10.5)
nRBC: 0 % (ref 0.0–0.2)

## 2021-01-20 LAB — GLUCOSE, CAPILLARY: Glucose-Capillary: 136 mg/dL — ABNORMAL HIGH (ref 70–99)

## 2021-01-20 MED ORDER — ACETAMINOPHEN 325 MG PO TABS: 325.0000 mg | ORAL_TABLET | Freq: Four times a day (QID) | ORAL | Status: DC | PRN

## 2021-01-20 MED ORDER — CEFAZOLIN SODIUM-DEXTROSE 2-4 GM/100ML-% IV SOLN
2.0000 g | Freq: Four times a day (QID) | INTRAVENOUS | Status: AC
  Administered 2021-01-20 (×2): 2 g via INTRAVENOUS
  Filled 2021-01-20 (×2): qty 100

## 2021-01-20 MED ORDER — METHOCARBAMOL 500 MG PO TABS: 500.0000 mg | ORAL_TABLET | Freq: Four times a day (QID) | ORAL | Status: DC | PRN

## 2021-01-20 MED ORDER — MENTHOL 3 MG MT LOZG: 1.0000 | LOZENGE | OROMUCOSAL | Status: DC | PRN

## 2021-01-20 MED ORDER — ASPIRIN 81 MG PO CHEW
81.0000 mg | CHEWABLE_TABLET | Freq: Two times a day (BID) | ORAL | Status: DC
  Administered 2021-01-20 – 2021-01-27 (×15): 81 mg via ORAL
  Filled 2021-01-20 (×14): qty 1

## 2021-01-20 MED ORDER — HYDROCODONE-ACETAMINOPHEN 5-325 MG PO TABS
1.0000 | ORAL_TABLET | ORAL | Status: DC | PRN
  Administered 2021-01-20 – 2021-01-21 (×2): 2 via ORAL
  Filled 2021-01-20: qty 2
  Filled 2021-01-20: qty 1
  Filled 2021-01-20: qty 2

## 2021-01-20 MED ORDER — HYDROCODONE-ACETAMINOPHEN 7.5-325 MG PO TABS
1.0000 | ORAL_TABLET | ORAL | Status: DC | PRN
  Administered 2021-01-20: 2 via ORAL
  Administered 2021-01-22 – 2021-01-26 (×2): 1 via ORAL
  Filled 2021-01-20 (×2): qty 1
  Filled 2021-01-20: qty 2

## 2021-01-20 MED ORDER — FENTANYL CITRATE (PF) 100 MCG/2ML IJ SOLN
INTRAMUSCULAR | Status: AC
  Filled 2021-01-20: qty 2

## 2021-01-20 MED ORDER — MORPHINE SULFATE (PF) 2 MG/ML IV SOLN: 0.5000 mg | INTRAVENOUS | Status: DC | PRN

## 2021-01-20 MED ORDER — SODIUM CHLORIDE 0.9 % IV SOLN: INTRAVENOUS | Status: DC

## 2021-01-20 MED ORDER — METHOCARBAMOL 1000 MG/10ML IJ SOLN
500.0000 mg | Freq: Four times a day (QID) | INTRAMUSCULAR | Status: DC | PRN
Start: 2021-01-19 — End: 2021-01-20

## 2021-01-20 MED ORDER — TRANEXAMIC ACID-NACL 1000-0.7 MG/100ML-% IV SOLN
1000.0000 mg | Freq: Once | INTRAVENOUS | Status: AC
  Administered 2021-01-20: 1000 mg via INTRAVENOUS
  Filled 2021-01-20: qty 100

## 2021-01-20 MED ORDER — ONDANSETRON HCL 4 MG/2ML IJ SOLN: 4.0000 mg | Freq: Four times a day (QID) | INTRAMUSCULAR | Status: DC | PRN

## 2021-01-20 MED ORDER — PHENOL 1.4 % MT LIQD: 1.0000 | OROMUCOSAL | Status: DC | PRN

## 2021-01-20 MED ORDER — METOCLOPRAMIDE HCL 5 MG/ML IJ SOLN: 5.0000 mg | Freq: Three times a day (TID) | INTRAMUSCULAR | Status: DC | PRN

## 2021-01-20 MED ORDER — ONDANSETRON HCL 4 MG PO TABS: 4.0000 mg | ORAL_TABLET | Freq: Four times a day (QID) | ORAL | Status: DC | PRN

## 2021-01-20 MED ORDER — ASPIRIN EC 325 MG PO TBEC: 325.0000 mg | DELAYED_RELEASE_TABLET | Freq: Every day | ORAL | Status: DC

## 2021-01-20 MED ORDER — ADULT MULTIVITAMIN W/MINERALS CH
1.0000 | ORAL_TABLET | Freq: Every day | ORAL | Status: DC
  Administered 2021-01-20 – 2021-01-27 (×8): 1 via ORAL
  Filled 2021-01-20 (×8): qty 1

## 2021-01-20 MED ORDER — METOCLOPRAMIDE HCL 5 MG PO TABS
5.0000 mg | ORAL_TABLET | Freq: Three times a day (TID) | ORAL | Status: DC | PRN
Start: 2021-01-19 — End: 2021-01-27

## 2021-01-20 MED ORDER — ENSURE ENLIVE PO LIQD
237.0000 mL | Freq: Two times a day (BID) | ORAL | Status: DC
  Administered 2021-01-21 – 2021-01-27 (×12): 237 mL via ORAL

## 2021-01-20 MED ORDER — DOCUSATE SODIUM 100 MG PO CAPS
100.0000 mg | ORAL_CAPSULE | Freq: Two times a day (BID) | ORAL | Status: DC
  Administered 2021-01-20 – 2021-01-27 (×14): 100 mg via ORAL
  Filled 2021-01-20 (×14): qty 1

## 2021-01-20 NOTE — NC FL2 (Signed)
[place] MEDICAID FL2 LEVEL OF CARE SCREENING TOOL     IDENTIFICATION  Patient Name: Elizabeth Tucker Birthdate: 04-29-1946 Sex: female Admission Date (Current Location): 01/19/2021  [facility] and [place] Number:  Producer, television/film/video and Address:  [facility], [address] [place], [place] [address]      Provider Number: [id number]  Attending Physician Name and Address:  Cathren Harsh, MD  Relative Name and Phone Number:  Scheryl Darter (Other)   [phone]    Current Level of Care: Hospital Recommended Level of Care: Skilled Nursing Facility Prior Approval Number:    Date Approved/Denied:   PASRR Number:    Discharge Plan: SNF    Current Diagnoses: Patient Active Problem List   Diagnosis Date Noted  . Closed left hip fracture, initial encounter (HCC) 01/19/2021  . Dementia with behavioral disturbance (HCC) 01/19/2021  . Class 2 obesity due to excess calories with body mass index (BMI) of 36.0 to 36.9 in adult 01/19/2021  . CHOLELITHIASIS, ASYMPTOMATIC 12/05/2009  . DEPRESSION 11/10/2009  . WEIGHT GAIN, ABNORMAL 11/10/2009  . HEPATITIS C, CHRONIC 02/14/2008  . ANA POSITIVE 02/14/2008  . ANKLE EDEMA 02/01/2008  . PETECHIAE 02/01/2008  . HYPOGLYCEMIA 05/11/2007  . MOURNING 05/11/2007  . INSOMNIA 05/11/2007  . INTERNAL HEMORRHOIDS 11/01/1999    Orientation RESPIRATION BLADDER Height & Weight     Self  Normal Incontinent Weight: 222 lb (100.7 kg) Height:  5\' 5"  (165.1 cm)  BEHAVIORAL SYMPTOMS/MOOD NEUROLOGICAL BOWEL NUTRITION STATUS      Incontinent Diet (see d/c summary)  AMBULATORY STATUS COMMUNICATION OF NEEDS Skin   Extensive Assist Verbally Surgical wounds                       Personal Care Assistance Level of Assistance  Bathing,Feeding,Dressing Bathing Assistance: Limited assistance Feeding assistance: Independent Dressing Assistance: Limited assistance     Functional Limitations Info  Hearing,Sight,Speech  Sight Info: Adequate Hearing Info: Impaired Speech Info: Adequate    SPECIAL CARE FACTORS FREQUENCY  PT (By licensed PT),OT (By licensed OT)     PT Frequency: 5x/week OT Frequency: 5x/week            Contractures Contractures Info: Not present    Additional Factors Info  Code Status,Allergies Code Status Info: FULL Allergies Info: Sulfonamide Derivatives           Current Medications (01/20/2021):  This is the current hospital active medication list Current Facility-Administered Medications  Medication Dose Route Frequency Provider Last Rate Last Admin  . 0.9 %  sodium chloride infusion   Intravenous Continuous 03/22/2021, MD      . acetaminophen (TYLENOL) tablet 325-650 mg  325-650 mg Oral Q6H PRN Kathryne Hitch, MD      . acetaminophen (TYLENOL) tablet 650 mg  650 mg Oral Q6H PRN Kathryne Hitch, MD      . aspirin chewable tablet 81 mg  81 mg Oral BID PC Kathryne Hitch, MD   81 mg at 01/20/21 1735  . bisacodyl (DULCOLAX) EC tablet 5 mg  5 mg Oral Daily PRN 03/22/21, MD      . docusate sodium (COLACE) capsule 100 mg  100 mg Oral BID Kathryne Hitch, MD   100 mg at 01/20/21 0949  . [START ON 01/21/2021] feeding supplement (ENSURE ENLIVE / ENSURE PLUS) liquid 237 mL  237 mL Oral BID BM Rai, Ripudeep K, MD      .  haloperidol (HALDOL) tablet 5 mg  5 mg Oral Q6H PRN Kathryne Hitch, MD      . HYDROcodone-acetaminophen [facility]) 7.5-325 MG per tablet 1-2 tablet  1-2 tablet Oral Q4H PRN Kathryne Hitch, MD   2 tablet at 01/20/21 0443  . HYDROcodone-acetaminophen (NORCO/VICODIN) 5-325 MG per tablet 1-2 tablet  1-2 tablet Oral Q6H PRN Kathryne Hitch, MD      . HYDROcodone-acetaminophen (NORCO/VICODIN) 5-325 MG per tablet 1-2 tablet  1-2 tablet Oral Q4H PRN Kathryne Hitch, MD   2 tablet at 01/20/21 1344  . LORazepam (ATIVAN) injection 1 mg  1 mg Intravenous Q4H PRN Kathryne Hitch, MD       . LORazepam (ATIVAN) tablet 0.5 mg  0.5 mg Oral BID Kathryne Hitch, MD   0.5 mg at 01/20/21 0949  . menthol-cetylpyridinium (CEPACOL) lozenge 3 mg  1 lozenge Oral PRN Kathryne Hitch, MD       Or  . phenol (CHLORASEPTIC) mouth spray 1 spray  1 spray Mouth/Throat PRN Kathryne Hitch, MD      . methocarbamol (ROBAXIN) tablet 500 mg  500 mg Oral Q6H PRN Kathryne Hitch, MD   500 mg at 01/20/21 0443   Or  . methocarbamol (ROBAXIN) 500 mg in dextrose 5 % 50 mL IVPB  500 mg Intravenous Q6H PRN Kathryne Hitch, MD      . metoCLOPramide (REGLAN) tablet 5-10 mg  5-10 mg Oral Q8H PRN Kathryne Hitch, MD       Or  . metoCLOPramide (REGLAN) injection 5-10 mg  5-10 mg Intravenous Q8H PRN Kathryne Hitch, MD      . morphine 2 MG/ML injection 0.5 mg  0.5 mg Intravenous Q2H PRN Kathryne Hitch, MD      . morphine 2 MG/ML injection 0.5-1 mg  0.5-1 mg Intravenous Q2H PRN Kathryne Hitch, MD      . multivitamin with minerals tablet 1 tablet  1 tablet Oral Daily Rai, Ripudeep K, MD   1 tablet at 01/20/21 1735  . ondansetron (ZOFRAN) tablet 4 mg  4 mg Oral Q6H PRN Kathryne Hitch, MD       Or  . ondansetron [facility]) injection 4 mg  4 mg Intravenous Q6H PRN Kathryne Hitch, MD      . pantoprazole (PROTONIX) EC tablet 40 mg  40 mg Oral Daily Kathryne Hitch, MD   40 mg at 01/20/21 0949  . polyethylene glycol (MIRALAX / GLYCOLAX) packet 17 g  17 g Oral Daily PRN Kathryne Hitch, MD      . traZODone (DESYREL) tablet 50 mg  50 mg Oral QHS Kathryne Hitch, MD      . ziprasidone (GEODON) capsule 40 mg  40 mg Oral Daily Kathryne Hitch, MD   40 mg at 01/20/21 9798     Discharge Medications: Please see discharge summary for a list of discharge medications.  Relevant Imaging Results:  Relevant Lab Results:   Additional Information SSN:  [id number]  Demani Weyrauch F Lorrena Goranson, LCSWA

## 2021-01-20 NOTE — Progress Notes (Signed)
Initial Nutrition Assessment  DOCUMENTATION CODES:   Obesity unspecified  INTERVENTION:   -MVI with minerals daily -Ensure Enlive po BID, each supplement provides 350 kcal and 20 grams of protein  NUTRITION DIAGNOSIS:   Increased nutrient needs related to post-op healing as evidenced by estimated needs.  GOAL:   Patient will meet greater than or equal to 90% of their needs  MONITOR:   PO intake,Supplement acceptance,Labs,Weight trends,Skin,I & O's  REASON FOR ASSESSMENT:   Consult Assessment of nutrition requirement/status,Hip fracture protocol  ASSESSMENT:   Elizabeth Tucker is a 75 y.o. female with medical history significant of dementia presenting with an unwitnessed fall.  The patient lives in ALF.  She is currently oriented x 3 but is unable to provide history of the event, thinks she is in the hospital for a physical.  Unable to reach family, message left on work voice mail.  Pt admitted with closed lt hip fracture.   5/31- s/p Procedure(s): TOTAL HIP ARTHROPLASTY ANTERIOR APPROACH (Left)  Reviewed I/O's: +700 ml x 24 hours  Pt sleeping soundly at time of visit. She did not respond to voice or touch.   No meal completion data available to assess at this time.   Reviewed wt hx; no wt loss noted.   Pt with increased nutritional needs for post-op healing and would benefit from addition of oral nutrition supplements.   Medications reviewed and include colace.  Labs reviewed: CBGS: 136.   NUTRITION - FOCUSED PHYSICAL EXAM:  Flowsheet Row Most Recent Value  Orbital Region No depletion  Upper Arm Region No depletion  Thoracic and Lumbar Region No depletion  Buccal Region No depletion  Temple Region No depletion  Clavicle Bone Region No depletion  Clavicle and Acromion Bone Region No depletion  Scapular Bone Region No depletion  Dorsal Hand No depletion  Patellar Region No depletion  Anterior Thigh Region No depletion  Posterior Calf Region No depletion   Edema (RD Assessment) Mild  Hair Reviewed  Eyes Reviewed  Mouth Reviewed  Skin Reviewed  Nails Reviewed       Diet Order:   Diet Order            Diet regular Room service appropriate? Yes; Fluid consistency: Thin  Diet effective now                 EDUCATION NEEDS:   No education needs have been identified at this time  Skin:  Skin Assessment: Skin Integrity Issues: Skin Integrity Issues:: Incisions Incisions: closed lt hip  Last BM:  Unknown  Height:   Ht Readings from Last 1 Encounters:  01/19/21 5\' 5"  (1.651 m)    Weight:   Wt Readings from Last 1 Encounters:  01/19/21 100.7 kg    Ideal Body Weight:  56.8 kg  BMI:  Body mass index is 36.94 kg/m.  Estimated Nutritional Needs:   Kcal:  1700-1900  Protein:  85-100 grams  Fluid:  > 1.7 L    01/21/21, RD, LDN, CDCES Registered Dietitian II Certified Diabetes Care and Education Specialist Please refer to [facility] for RD and/or RD on-call/weekend/after hours pager

## 2021-01-20 NOTE — Progress Notes (Signed)
Patient has arrived on unit from PACU.  She is alert and oriented to self only.  Pleasantly confused.  She is not a good historian to do admission history.

## 2021-01-20 NOTE — TOC CAGE-AID Note (Signed)
Transition of Care [facility]) - CAGE-AID Screening   Patient Details  Name: Elizabeth Tucker MRN: [id number] Date of Birth: [birth year]  [facility]) CM/SW Contact:    Janora Norlander, RN Phone Number: [phone] 01/20/2021, 12:42 PM   Clinical Narrative: Pt here after unwitnessed fall at [facility], where she resides.  She has dementia and Alzheimer's and is unable to participate in screening.   CAGE-AID Screening: Substance Abuse Screening unable to be completed due to: : Patient unable to participate

## 2021-01-20 NOTE — Progress Notes (Signed)
Subjective: 1 Day Post-Op Procedure(s) (LRB): TOTAL HIP ARTHROPLASTY ANTERIOR APPROACH (Left) Patient is awake and alert but pleasantly demented.  She does follow some limited commands.  She appears comfortable overall.  Her vital signs are stable.  Her H and H is also stable.  She tolerated surgery well.  Objective: Vital signs in last 24 hours: Temp:  [96.8 F (36 C)-98.5 F (36.9 C)] 97.7 F (36.5 C) (06/01 0113) Pulse Rate:  [74-95] 78 (06/01 0113) Resp:  [12-22] 18 (06/01 0113) BP: (92-166)/(57-100) 118/77 (06/01 0113) SpO2:  [80 %-100 %] 98 % (06/01 0113)  Intake/Output from previous day: 05/31 0701 - 06/01 0700 In: 900 [I.V.:700; IV Piggyback:200] Out: 200 [Blood:200] Intake/Output this shift: No intake/output data recorded.  Recent Labs    01/19/21 0702 01/20/21 0232  HGB 14.2 13.3   Recent Labs    01/19/21 0702 01/20/21 0232  WBC 5.1 8.0  RBC 4.69 4.37  HCT 41.8 38.6  PLT PLATELET CLUMPS NOTED ON SMEAR, UNABLE TO ESTIMATE 104*   Recent Labs    01/19/21 0702 01/20/21 0232  NA 135 135  K 3.8 4.0  CL 103 101  CO2 24 23  BUN 8 10  CREATININE 0.75 0.71  GLUCOSE 122* 122*  CALCIUM 9.2 8.7*   Recent Labs    01/19/21 0702  INR 1.1    Intact pulses distally Dorsiflexion/Plantar flexion intact Incision: dressing C/D/I   Assessment/Plan: 1 Day Post-Op Procedure(s) (LRB): TOTAL HIP ARTHROPLASTY ANTERIOR APPROACH (Left) Up with therapy She can weight-bear as tolerated on this left hip. 81 mg aspirin twice daily for DVT coverage. The transitional care team will be consulted for return to her previous skilled nursing facility when medically stable which can be likely as soon as tomorrow.     Kathryne Hitch 01/20/2021, 7:46 AM

## 2021-01-20 NOTE — Evaluation (Signed)
Physical Therapy Evaluation Patient Details Name: Elizabeth Tucker MRN: [id number] DOB: 12-18-1945 Today's Date: 01/20/2021   History of Present Illness  Pt presented to the ED 01/19/21 with a L hip fracture from an unwitnessed fall. She underwent direct anterior approach L THA 01/19/21. PMH consists of Alzheimer's disease, GERD, and hep C.    Clinical Impression  Pt admitted with above diagnosis. PTA pt resided at [facility]. Pt with dementia at baseline and unable to provide mobility history. Per chart, pt was ambulatory, unsure if needed AD. On eval, she required max assist bed mobility, mod assist sit to stand, and mod assist SPT with RW. Unable to progress LLE for gait. Suspect with pain management, pt will progress well with mobility.  Pt currently with functional limitations due to the deficits listed below (see PT Problem List). Pt will benefit from skilled PT to increase their independence and safety with mobility to allow discharge to the venue listed below.       Follow Up Recommendations SNF;Supervision/Assistance - 24 hour    Equipment Recommendations  Rolling walker with 5" wheels;Wheelchair (measurements PT);Wheelchair cushion (measurements PT);3in1 (PT)    Recommendations for Other Services       Precautions / Restrictions Precautions Precautions: Fall Restrictions LLE Weight Bearing: Weight bearing as tolerated      Mobility  Bed Mobility Overal bed mobility: Needs Assistance Bed Mobility: Supine to Sit     Supine to sit: Max assist;HOB elevated     General bed mobility comments: continuous cues for sequencing, assist with BLE and trunk, increased time, use of bed pad to scoot to EOB    Transfers Overall transfer level: Needs assistance Equipment used: Rolling walker (2 wheeled) Transfers: Sit to/from [employer] Sit to Stand: Mod assist;From elevated surface Stand pivot transfers: Mod assist       General transfer comment: cues  for hand placement, assist to power up and stabilize balance, increased time, pivot toward R with RW to transition bed to recliner  Ambulation/Gait             General Gait Details: unable  Stairs            Wheelchair Mobility    Modified Rankin (Stroke Patients Only)       Balance Overall balance assessment: Needs assistance Sitting-balance support: Feet supported;Bilateral upper extremity supported Sitting balance-Leahy Scale: Fair     Standing balance support: Bilateral upper extremity supported;During functional activity Standing balance-Leahy Scale: Poor Standing balance comment: reliant on external support                             Pertinent Vitals/Pain Pain Assessment: Faces Faces Pain Scale: Hurts even more Pain Location: L hip with mobility Pain Descriptors / Indicators: Grimacing;Guarding;Burning Pain Intervention(s): Limited activity within patient's tolerance;Monitored during session;Repositioned    Home Living Family/patient expects to be discharged to:: Assisted living                 Additional Comments: [facility]    Prior Function           Comments: Pt resides in ALF. Unsure of assist level needed. Pt with dementia at baseline, unable to provide history. Ambulatory per chart.     Hand Dominance        Extremity/Trunk Assessment   Upper Extremity Assessment Upper Extremity Assessment: Overall WFL for tasks assessed    Lower Extremity Assessment Lower Extremity Assessment: LLE  deficits/detail LLE Deficits / Details: s/p THA (direct ant) LLE: Unable to fully assess due to pain       Communication   Communication: No difficulties  Cognition Arousal/Alertness: Awake/alert Behavior During Therapy: WFL for tasks assessed/performed Overall Cognitive Status: No family/caregiver present to determine baseline cognitive functioning                                 General Comments: dementia  at baseline, pleasantly confused      General Comments General comments (skin integrity, edema, etc.): VSS on RA    Exercises     Assessment/Plan    PT Assessment Patient needs continued PT services  PT Problem List Decreased mobility;Decreased safety awareness;Decreased knowledge of precautions;Decreased activity tolerance;Pain;Decreased balance;Decreased knowledge of use of DME       PT Treatment Interventions DME instruction;Therapeutic activities;Gait training;Therapeutic exercise;Patient/family education;Balance training;Functional mobility training    PT Goals (Current goals can be found in the Care Plan section)  Acute Rehab PT Goals Patient Stated Goal: not stated PT Goal Formulation: Patient unable to participate in goal setting Time For Goal Achievement: 02/03/21 Potential to Achieve Goals: Good    Frequency Min 3X/week   Barriers to discharge        Co-evaluation               AM-PAC PT "6 Clicks" Mobility  Outcome Measure Help needed turning from your back to your side while in a flat bed without using bedrails?: A Lot Help needed moving from lying on your back to sitting on the side of a flat bed without using bedrails?: A Lot Help needed moving to and from a bed to a chair (including a wheelchair)?: A Lot Help needed standing up from a chair using your arms (e.g., wheelchair or bedside chair)?: A Lot Help needed to walk in hospital room?: Total Help needed climbing 3-5 steps with a railing? : Total 6 Click Score: 10    End of Session Equipment Utilized During Treatment: Gait belt Activity Tolerance: Patient tolerated treatment well Patient left: in chair;with call bell/phone within reach;with chair alarm set Nurse Communication: Mobility status;Need for lift equipment (possible stedy for return to bed) PT Visit Diagnosis: Other abnormalities of gait and mobility (R26.89);History of falling (Z91.81);Difficulty in walking, not elsewhere classified  (R26.2);Pain Pain - Right/Left: Left Pain - part of body: Hip    Time: 2836-6294 PT Time Calculation (min) (ACUTE ONLY): 27 min   Charges:   PT Evaluation $PT Eval Moderate Complexity: 1 Mod PT Treatments $Therapeutic Activity: 8-22 mins        Aida Raider, PT  Office # [phone] Pager [phone]   Ilda Foil 01/20/2021, 12:26 PM

## 2021-01-20 NOTE — Progress Notes (Signed)
[facility]                                                                              Patient Demographics  Elizabeth Tucker, is a 75 y.o. female, DOB - 09-13-1945, VQQ:[id number]  Admit date - 01/19/2021   Admitting Physician Jonah BlueJennifer Yates, MD  Outpatient Primary MD for the patient is Ngetich, Donalee Citrininah C, NP  Outpatient specialists:   Presented from: ALF  LOS - 1  days   Medical records reviewed and are as summarized below:    Chief Complaint  Patient presents with  . Fall       Brief summary   Patient is a 75 year old female with history of dementia, Alzheimer's disease, GERD, prior history of hepatitis C, presented with unwitnessed fall.  Patient lives in a assisted living facility.  Per EMS report, she fell onto tile.  Subsequently complained of pain in her left hip. X-rays showed displaced femoral neck fracture on the left side, orthopedics was consulted.  Assessment & Plan    Principal Problem: Unwitnessed mechanical fall with closed left hip fracture, initial encounter [facility](HCC) - Orthopedics consulted, underwent left total hip arthroplasty 6/1 overnight - PT eval recommended SNF -Continue pain control, bowel regimen, delirium precautions -Follow CBC, BMET -Per orthopedics, aspirin 81 mg twice daily for DVT coverage.  Weightbearing as tolerated on the left hip.  Active Problems:   Dementia with behavioral disturbance (HCC) -Currently stable, delirium precautions -Continue Geodon 40 mg daily, trazodone 50 mg at bedtime, Ativan 1 mg every 6 hours as needed, 0.5 mg twice daily -Haldol as needed for agitation     Class 2 obesity due to excess calories with body mass index (BMI) of 36.0 to 36.9 in adult Estimated body mass index is 36.94 kg/m as calculated from the following:   Height as of this encounter: 5\' 5"  (1.651 m).   Weight as of this encounter: 100.7 kg.  Code Status: Full CODE STATUS DVT Prophylaxis:  SCDs Start: 01/20/21 0141 Place TED  hose Start: 01/20/21 0141 SCDs Start: 01/19/21 0958   Level of Care: Level of care: Med-Surg Family Communication: Discussed all imaging results, lab results, explained to the patient's close friend, Ms Scheryl DarterLaToya Winslow.    Disposition Plan:     Status is: Inpatient Remains inpatient appropriate because:Inpatient level of care appropriate due to severity of illness   Dispo: The patient is from: ALF              Anticipated d/c is to: SNF              Patient currently is not medically stable to d/c.  Discussed in detail with Ms. Winslow, she requested skilled nursing facility placement for rehab and then go back to patient's ALF once patient is close to her baseline.   Difficult to place patient No      Time Spent in minutes 35 minutes  Procedures:  Left total hip arthroplasty 6/1  Consultants:   Orthopedics  Antimicrobials:   Anti-infectives (From admission, onward)   Start     Dose/Rate Route Frequency Ordered Stop   01/20/21 0600  ceFAZolin (ANCEF) IVPB 2g/100  mL premix        2 g 200 mL/hr over 30 Minutes Intravenous On call to O.R. 01/19/21 1740 01/19/21 2126   01/20/21 0300  ceFAZolin (ANCEF) IVPB 2g/100 mL premix        2 g 200 mL/hr over 30 Minutes Intravenous Every 6 hours 01/20/21 0140 01/20/21 1023          Medications  Scheduled Meds: . aspirin  81 mg Oral BID PC  . docusate sodium  100 mg Oral BID  . LORazepam  0.5 mg Oral BID  . pantoprazole  40 mg Oral Daily  . traZODone  50 mg Oral QHS  . ziprasidone  40 mg Oral Daily   Continuous Infusions: . sodium chloride    . methocarbamol (ROBAXIN) IV     PRN Meds:.acetaminophen, acetaminophen, bisacodyl, haloperidol, HYDROcodone-acetaminophen, HYDROcodone-acetaminophen, HYDROcodone-acetaminophen, LORazepam, menthol-cetylpyridinium **OR** phenol, methocarbamol **OR** methocarbamol (ROBAXIN) IV, metoCLOPramide **OR** metoCLOPramide (REGLAN) injection, morphine injection, morphine injection, ondansetron  **OR** ondansetron (ZOFRAN) IV, polyethylene glycol      Subjective:   Kyrstal Monterrosa was seen and examined today.  Pleasant, does not appear to be in significant pain.  Underwent surgery overnight.  Patient denies dizziness, chest pain, shortness of breath, abdominal pain, nausea vomiting.  Objective:   Vitals:   01/20/21 0055 01/20/21 0113 01/20/21 0804 01/20/21 1141  BP: (!) 111/57 118/77 97/66 105/72  Pulse: 82 78 83 78  Resp: 17 18 20 18   Temp: 98.5 F (36.9 C) 97.7 F (36.5 C) 97.8 F (36.6 C) 97.9 F (36.6 C)  TempSrc:  Oral Oral Oral  SpO2: 100% 98% 96% 95%  Weight:      Height:        Intake/Output Summary (Last 24 hours) at 01/20/2021 1412 Last data filed at 01/19/2021 2151 Gross per 24 hour  Intake 900 ml  Output 200 ml  Net 700 ml     Wt Readings from Last 3 Encounters:  01/19/21 100.7 kg  11/27/20 82.3 kg  05/26/20 85.8 kg     Exam  General: Alert and oriented x self, pleasant comfortable  Cardiovascular: S1 S2 auscultated, RRR  Respiratory: CTA B  Gastrointestinal: Soft, nontender, nondistended, + bowel sounds  Ext: no pedal edema bilaterally  Neuro:   Musculoskeletal: No digital cyanosis, clubbing  Skin: No rashes  Psych: pleasant, dementia   Data Reviewed:  I have personally reviewed following labs and imaging studies  Micro Results Recent Results (from the past 240 hour(s))  Resp Panel by RT-PCR (Flu A&B, Covid) Nasopharyngeal Swab     Status: None   Collection Time: 01/19/21  7:38 AM   Specimen: Nasopharyngeal Swab; Nasopharyngeal(NP) swabs in vial transport medium  Result Value Ref Range Status   SARS Coronavirus 2 by RT PCR NEGATIVE NEGATIVE Final    Comment: (NOTE) SARS-CoV-2 target nucleic acids are NOT DETECTED.  The SARS-CoV-2 RNA is generally detectable in upper respiratory specimens during the acute phase of infection. The lowest concentration of SARS-CoV-2 viral copies this assay can detect is 138 copies/mL. A  negative result does not preclude SARS-Cov-2 infection and should not be used as the sole basis for treatment or other patient management decisions. A negative result may occur with  improper specimen collection/handling, submission of specimen other than nasopharyngeal swab, presence of viral mutation(s) within the areas targeted by this assay, and inadequate number of viral copies(<138 copies/mL). A negative result must be combined with clinical observations, patient history, and epidemiological information. The expected result is Negative.  Fact  Sheet for Patients:  BloggerCourse.com  Fact Sheet for Healthcare Providers:  SeriousBroker.it  This test is no t yet approved or cleared by the [place] FDA and  has been authorized for detection and/or diagnosis of SARS-CoV-2 by FDA under an Emergency Use Authorization (EUA). This EUA will remain  in effect (meaning this test can be used) for the duration of the COVID-19 declaration under Section 564(b)(1) of the Act, 21 U.S.C.section 360bbb-3(b)(1), unless the authorization is terminated  or revoked sooner.       Influenza A by PCR NEGATIVE NEGATIVE Final   Influenza B by PCR NEGATIVE NEGATIVE Final    Comment: (NOTE) The Xpert Xpress SARS-CoV-2/FLU/RSV plus assay is intended as an aid in the diagnosis of influenza from Nasopharyngeal swab specimens and should not be used as a sole basis for treatment. Nasal washings and aspirates are unacceptable for Xpert Xpress SARS-CoV-2/FLU/RSV testing.  Fact Sheet for Patients: BloggerCourse.com  Fact Sheet for Healthcare Providers: SeriousBroker.it  This test is not yet approved or cleared by the [place] FDA and has been authorized for detection and/or diagnosis of SARS-CoV-2 by FDA under an Emergency Use Authorization (EUA). This EUA will remain in effect (meaning this test can  be used) for the duration of the COVID-19 declaration under Section 564(b)(1) of the Act, 21 U.S.C. section 360bbb-3(b)(1), unless the authorization is terminated or revoked.  Performed at [facility], [address] [place], [place] [address]   Surgical pcr screen     Status: None   Collection Time: 01/19/21  6:21 PM   Specimen: Nasal Mucosa; Nasal Swab  Result Value Ref Range Status   MRSA, PCR NEGATIVE NEGATIVE Final   Staphylococcus aureus NEGATIVE NEGATIVE Final    Comment: (NOTE) The Xpert SA Assay (FDA approved for NASAL specimens in patients 39 years of age and older), is one component of a comprehensive surveillance program. It is not intended to diagnose infection nor to guide or monitor treatment. Performed at [facility], [address] [place], [place] [address]     Radiology Reports DG Chest 1 View  Result Date: 01/19/2021 CLINICAL DATA:  Preoperative exam.  Fall. EXAM: CHEST  1 VIEW COMPARISON:  No recent prior. FINDINGS: The heart size and mediastinal contours are within normal limits. Both lungs are clear. No acute bony abnormality. Degenerative changes scoliosis thoracic spine. IMPRESSION: No active disease. Electronically Signed   By: Maisie Fus  Register   On: 01/19/2021 07:36   CT HEAD WO CONTRAST  Result Date: 01/19/2021 CLINICAL DATA:  75 year old female with history of trauma from a fall with injury to the head. EXAM: CT HEAD WITHOUT CONTRAST CT CERVICAL SPINE WITHOUT CONTRAST TECHNIQUE: Multidetector CT imaging of the head and cervical spine was performed following the standard protocol without intravenous contrast. Multiplanar CT image reconstructions of the cervical spine were also generated. COMPARISON:  No priors. FINDINGS: CT HEAD FINDINGS Brain: Very mild cerebral atrophy. Patchy areas of mild decreased attenuation are noted throughout the periventricular white matter of the cerebral hemispheres bilaterally, compatible with chronic  microvascular ischemic disease. No evidence of acute infarction, hemorrhage, hydrocephalus, extra-axial collection or mass lesion/mass effect. Vascular: No hyperdense vessel or unexpected calcification. Skull: Normal. Negative for fracture or focal lesion. Sinuses/Orbits: No acute finding. Other: None. CT CERVICAL SPINE FINDINGS Alignment: Normal. Skull base and vertebrae: No acute fracture. No primary bone lesion or focal pathologic process. Soft tissues and spinal canal: No prevertebral fluid or swelling. No visible canal hematoma. Disc levels:  Multilevel degenerative disc disease, most pronounced at C6-C7 and C7-T1. Mild multilevel facet arthropathy. Upper chest: Unremarkable. Other: None. IMPRESSION: 1. No evidence of significant acute traumatic injury to the skull, brain or cervical spine. 2. Very mild cerebral atrophy and chronic microvascular ischemic changes in the periventricular white matter of the cerebral hemispheres. 3. Multilevel degenerative disc disease and cervical spondylosis, as above. Electronically Signed   By: Trudie Reed M.D.   On: 01/19/2021 07:48   CT CERVICAL SPINE WO CONTRAST  Result Date: 01/19/2021 CLINICAL DATA:  75 year old female with history of trauma from a fall with injury to the head. EXAM: CT HEAD WITHOUT CONTRAST CT CERVICAL SPINE WITHOUT CONTRAST TECHNIQUE: Multidetector CT imaging of the head and cervical spine was performed following the standard protocol without intravenous contrast. Multiplanar CT image reconstructions of the cervical spine were also generated. COMPARISON:  No priors. FINDINGS: CT HEAD FINDINGS Brain: Very mild cerebral atrophy. Patchy areas of mild decreased attenuation are noted throughout the periventricular white matter of the cerebral hemispheres bilaterally, compatible with chronic microvascular ischemic disease. No evidence of acute infarction, hemorrhage, hydrocephalus, extra-axial collection or mass lesion/mass effect. Vascular: No  hyperdense vessel or unexpected calcification. Skull: Normal. Negative for fracture or focal lesion. Sinuses/Orbits: No acute finding. Other: None. CT CERVICAL SPINE FINDINGS Alignment: Normal. Skull base and vertebrae: No acute fracture. No primary bone lesion or focal pathologic process. Soft tissues and spinal canal: No prevertebral fluid or swelling. No visible canal hematoma. Disc levels: Multilevel degenerative disc disease, most pronounced at C6-C7 and C7-T1. Mild multilevel facet arthropathy. Upper chest: Unremarkable. Other: None. IMPRESSION: 1. No evidence of significant acute traumatic injury to the skull, brain or cervical spine. 2. Very mild cerebral atrophy and chronic microvascular ischemic changes in the periventricular white matter of the cerebral hemispheres. 3. Multilevel degenerative disc disease and cervical spondylosis, as above. Electronically Signed   By: Trudie Reed M.D.   On: 01/19/2021 07:48   Pelvis Portable  Result Date: 01/19/2021 CLINICAL DATA:  Postop left hip EXAM: PORTABLE PELVIS 1-2 VIEWS COMPARISON:  01/19/2021 FINDINGS: Interval left hip replacement with intact hardware and normal alignment. Gas in the soft tissues consistent with recent surgery. IMPRESSION: Interval left hip replacement with expected postsurgical change Electronically Signed   By: Jasmine Pang M.D.   On: 01/19/2021 23:32   DG C-Arm 1-60 Min  Result Date: 01/19/2021 CLINICAL DATA:  Anterior left hip arthroplasty EXAM: DG C-ARM 1-60 MIN; DG HIP (WITH OR WITHOUT PELVIS) 2-3V LEFT FLUOROSCOPY TIME:  Fluoroscopy Time:  33 seconds Radiation Exposure Index (if provided by the fluoroscopic device): 1.69 mGy Number of Acquired Spot Images: 3 COMPARISON:  01/19/2021 FINDINGS: Three fluoroscopic images are obtained during the performance of the procedure and are provided for interpretation only. A left hip arthroplasty is identified in the expected position without signs of complication. IMPRESSION: 1.  Unremarkable left hip arthroplasty. Please refer to the operative report. Electronically Signed   By: Sharlet Salina M.D.   On: 01/19/2021 22:14   DG HIP UNILAT WITH PELVIS 2-3 VIEWS LEFT  Result Date: 01/19/2021 CLINICAL DATA:  Anterior left hip arthroplasty EXAM: DG C-ARM 1-60 MIN; DG HIP (WITH OR WITHOUT PELVIS) 2-3V LEFT FLUOROSCOPY TIME:  Fluoroscopy Time:  33 seconds Radiation Exposure Index (if provided by the fluoroscopic device): 1.69 mGy Number of Acquired Spot Images: 3 COMPARISON:  01/19/2021 FINDINGS: Three fluoroscopic images are obtained during the performance of the procedure and are provided for interpretation only. A left hip arthroplasty is  identified in the expected position without signs of complication. IMPRESSION: 1. Unremarkable left hip arthroplasty. Please refer to the operative report. Electronically Signed   By: Sharlet Salina M.D.   On: 01/19/2021 22:14   DG Hip Unilat With Pelvis 2-3 Views Left  Result Date: 01/19/2021 CLINICAL DATA:  75 year old female with left side hip pain and rotation. Possible fracture. Preoperative exam. EXAM: DG HIP (WITH OR WITHOUT PELVIS) 2-3V LEFT COMPARISON:  None. FINDINGS: Left femoral head is normally located. But a left femoral neck fracture is visible on image 3 with mild impaction. The left intertrochanteric segment and proximal left femoral shaft remain intact. No pelvis fracture. SI joints appear normal. Grossly intact proximal right femur. Negative visible bowel gas pattern.  Incidental pelvic phleboliths. IMPRESSION: Positive for left femoral neck fracture with mild impaction. Electronically Signed   By: Odessa Fleming M.D.   On: 01/19/2021 07:43    Lab Data:  CBC: Recent Labs  Lab 01/19/21 0702 01/20/21 0232  WBC 5.1 8.0  NEUTROABS 4.1  --   HGB 14.2 13.3  HCT 41.8 38.6  MCV 89.1 88.3  PLT PLATELET CLUMPS NOTED ON SMEAR, UNABLE TO ESTIMATE 104*   Basic Metabolic Panel: Recent Labs  Lab 01/19/21 0702 01/20/21 0232  NA 135 135   K 3.8 4.0  CL 103 101  CO2 24 23  GLUCOSE 122* 122*  BUN 8 10  CREATININE 0.75 0.71  CALCIUM 9.2 8.7*   GFR: Estimated Creatinine Clearance: 74.8 mL/min (by C-G formula based on SCr of 0.71 mg/dL). Liver Function Tests: No results for input(s): AST, ALT, ALKPHOS, BILITOT, PROT, ALBUMIN in the last 168 hours. No results for input(s): LIPASE, AMYLASE in the last 168 hours. No results for input(s): AMMONIA in the last 168 hours. Coagulation Profile: Recent Labs  Lab 01/19/21 0702  INR 1.1   Cardiac Enzymes: No results for input(s): CKTOTAL, CKMB, CKMBINDEX, TROPONINI in the last 168 hours. BNP (last 3 results) No results for input(s): PROBNP in the last 8760 hours. HbA1C: No results for input(s): HGBA1C in the last 72 hours. CBG: Recent Labs  Lab 01/20/21 1139  GLUCAP 136*   Lipid Profile: No results for input(s): CHOL, HDL, LDLCALC, TRIG, CHOLHDL, LDLDIRECT in the last 72 hours. Thyroid Function Tests: No results for input(s): TSH, T4TOTAL, FREET4, T3FREE, THYROIDAB in the last 72 hours. Anemia Panel: No results for input(s): VITAMINB12, FOLATE, FERRITIN, TIBC, IRON, RETICCTPCT in the last 72 hours. Urine analysis:    Component Value Date/Time   COLORURINE YELLOW 01/19/2021 1001   APPEARANCEUR CLEAR 01/19/2021 1001   LABSPEC 1.013 01/19/2021 1001   PHURINE 6.0 01/19/2021 1001   GLUCOSEU NEGATIVE 01/19/2021 1001   HGBUR NEGATIVE 01/19/2021 1001   HGBUR negative 02/01/2008 1038   BILIRUBINUR NEGATIVE 01/19/2021 1001   KETONESUR NEGATIVE 01/19/2021 1001   PROTEINUR NEGATIVE 01/19/2021 1001   UROBILINOGEN 1.0 08/14/2008 0851   NITRITE NEGATIVE 01/19/2021 1001   LEUKOCYTESUR TRACE (A) 01/19/2021 1001     Allon Costlow M.D. [facility] 01/20/2021, 2:12 PM  Available via Epic secure chat 7am-7pm After 7 pm, please refer to night coverage provider listed on amion.

## 2021-01-20 NOTE — TOC Initial Note (Signed)
Transition of Care [facility]) - Initial/Assessment Note    Patient Details  Name: Elizabeth Tucker MRN: [id number] Date of Birth: 12/14/1945  [facility]) CM/SW Contact:    Ralene Bathe, LCSWA Phone Number: 01/20/2021, 5:22 PM  Clinical Narrative:                 CSW received consult for possible SNF placement at time of discharge. CSW spoke with the patient's emergency contact Scheryl Darter as the patient has dementia and is only oriented to time.  Ms. Blondell Reveal expressed understanding of PT recommendation and is agreeable to SNF placement at time of discharge.  Ms. Blondell Reveal did not have an agency preference. CSW discussed insurance authorization process and provided Medicare SNF ratings list.    CSW contacted the ALF, [place], where the patient lived prior to d/c.  The facility does not have a SNF on site, but will accept the patient back once she has progressed with rehab.  No further questions reported at this time.   Expected Discharge Plan: Skilled Nursing Facility Barriers to Discharge: Continued Medical Work up,Insurance Authorization,SNF Pending bed offer   Patient Goals and CMS Choice   CMS Medicare.gov Compare Post Acute Care list provided to:: Patient Represenative (must comment) (Emegency Contact Ms. Winslow) Choice offered to / list presented to : Adult Children  Expected Discharge Plan and Services Expected Discharge Plan: Skilled Nursing Facility       Living arrangements for the past 2 months: Assisted Living Facility                                      Prior Living Arrangements/Services Living arrangements for the past 2 months: Assisted Living Facility Lives with:: Facility Resident Patient language and need for interpreter reviewed:: Yes        Need for Family Participation in Patient Care: Yes (Comment) Care giver support system in place?: Yes (comment)   Criminal Activity/Legal Involvement Pertinent to Current  Situation/Hospitalization: No - Comment as needed  Activities of Daily Living      Permission Sought/Granted Permission sought to share information with : Family Supports Permission granted to share information with : Yes, Verbal Permission Granted     Permission granted to share info w AGENCY: SNF        Emotional Assessment       Orientation: : Oriented to Self Alcohol / Substance Use: Not Applicable Psych Involvement: No (comment)  Admission diagnosis:  Closed fracture of left hip, initial encounter (HCC) [S72.002A] Fall, initial encounter [W19.XXXA] Closed left hip fracture, initial encounter [facility]) [S72.002A] Patient Active Problem List   Diagnosis Date Noted  . Closed left hip fracture, initial encounter (HCC) 01/19/2021  . Dementia with behavioral disturbance (HCC) 01/19/2021  . Class 2 obesity due to excess calories with body mass index (BMI) of 36.0 to 36.9 in adult 01/19/2021  . CHOLELITHIASIS, ASYMPTOMATIC 12/05/2009  . DEPRESSION 11/10/2009  . WEIGHT GAIN, ABNORMAL 11/10/2009  . HEPATITIS C, CHRONIC 02/14/2008  . ANA POSITIVE 02/14/2008  . ANKLE EDEMA 02/01/2008  . PETECHIAE 02/01/2008  . HYPOGLYCEMIA 05/11/2007  . MOURNING 05/11/2007  . INSOMNIA 05/11/2007  . INTERNAL HEMORRHOIDS 11/01/1999   PCP:  Caesar Bookman, NP Pharmacy:   [facility] [phone] [facility] - [place], [place] - [address] [place] [address] Phone: [phone] Fax: [phone]  [facility] - [place], [place] -  [address] [place] [address] Phone: [phone] Fax: [phone]     Social Determinants of Health (SDOH) Interventions    Readmission Risk Interventions No flowsheet data found.

## 2021-01-20 NOTE — Discharge Instructions (Signed)

## 2021-01-20 NOTE — Op Note (Signed)
NAME: HILARIE, SINHA MEDICAL RECORD NO: [id number] ACCOUNT NO: [id number] DATE OF BIRTH: [birth year] FACILITY: [facility] LOCATION: [facility] PHYSICIAN: Vanita Panda. Magnus Ivan, MD  Operative Report   DATE OF PROCEDURE: 01/19/2021  PREOPERATIVE DIAGNOSIS:  Left acute displaced femoral neck fracture.  POSTOPERATIVE DIAGNOSIS:  Left acute displaced femoral neck fracture.  PROCEDURE:  Left total hip arthroplasty, direct anterior approach.  IMPLANTS:  DePuy Sector Gription acetabular component size 52, size 36+0 neutral polyethylene liner, size 12 Corail femoral component with standard offset, size 36+1.5 metal hip ball.  SURGEON:  Vanita Panda. Magnus Ivan, MD  ASSISTANT:  Richardean Canal, PA-C  ANESTHESIA:  General.  ANTIBIOTICS:  2 grams IV Ancef.  OTHER MEDICATIONS: 1 g of tranexamic acid.  BLOOD LOSS:  200 mL.  COMPLICATIONS:  None.  INDICATIONS:  The patient is a 75 year old female with some dementia who lives in a nursing facility.  She had a mechanical fall early this morning and had inability to ambulate.  She was brought to the [facility] emergency room and found to have a  displaced left hip femoral neck fracture.  We talked to the healthcare power of attorney and I have recommended total hip arthroplasty through direct anterior approach to treat this type of injury.  The risks and benefits of surgery were explained in  detail and the power of attorney does wish for [place] to proceed.  The patient is awake and alert and follows commands, but definitely has some dementia.  She is very pleasant.  She is exhibiting severe pain and does ambulate prior to this injury.  After  thorough discussion of risks and benefits of surgery, informed consent was obtained and the left hip was marked.  DESCRIPTION OF PROCEDURE:  After informed consent was obtained, appropriate left hip was marked.  She was brought to the operating room and general anesthesia was obtained while she was on a stretcher.   Traction boots were placed on both her feet.  Next,  she was placed supine on the Hana fracture table, the perineal post in place and both legs in line skeletal traction device and no traction applied.  Her left operative hip was prepped and draped in DuraPrep and sterile drapes.  A timeout was called and  she was identified as correct patient, correct left hip.  I then made an incision just inferior and posterior to the anterior superior iliac spine and carried this obliquely down the leg.  We dissected down the tensor fascia lata muscle.  Tensor fascia  was then divided longitudinally to proceed with direct anterior approach to the hip.  We identified and cauterized circumflex vessels and identified the hip capsule, opened up the hip capsule in L-type format, finding a very large hemarthrosis consistent  with a femoral neck fracture.  We did find a displaced femoral neck fracture.  We were able to already put a corkscrew guide in the femoral head and removed it in its entirety.  We then made a freshening femoral neck cut with the oscillating saw just  proximal to the lesser trochanter and completed this with an osteotome.  We cleaned bony debris from the acetabulum and removed remnants of the acetabular labrum and other debris.  We then began reaming under direct visualization from a size 43 reamer  going in stepwise increments up to a size 51, with all reamers placed under direct visualization and last reamer was also placed under direct fluoroscopy, so we could obtain our depth of reaming, our inclination and anteversion.  I  then placed the real  DePuy Sector Gription acetabular component size 52 and a 36+0 neutral polyethylene liner for that size acetabular component.  Attention was then turned to the femur with the leg externally rotated to 120 degrees, extended and adducted, we were able to  place a Mueller retractor medially and a Hohmann retractor behind the greater trochanter. We released the  lateral joint capsule and used a box cutting osteotome to enter the femoral canal and a rongeur to slightly lateralize, then began broaching using  the Corail broaching system, going from a size 8 up to a size 12.  With the size 12 in place, we trialed standard offset femoral neck and a 36+1.5 hip ball.  We brought the leg back over and up and with traction and internal rotation, reduced in the  pelvis and we were pleased with leg length, offset, range of motion and stability assessed radiographically and mechanically.  We then dislocated the hip, removed the trial components.  We placed the real Corail femoral component size 12 with standard  offset and the real 36+1.5 metal hip ball and again reduced this in the acetabulum and it was stable.  We then irrigated the soft tissue with normal saline solution using pulsatile lavage.  We closed the joint capsule with interrupted #1 Ethibond suture,  followed by running #1 Vicryl to close the tensor fascia.  0 Vicryl was used to close the deep tissue and 2-0 Vicryl was used to close subcutaneous tissue.  The skin was closed with staples.  An Aquacel dressing was applied.  She was taken off the Hana  table, awakened, extubated, and taken to recovery room in stable condition with all final counts being correct.  No complications noted.  Of note, Rexene Edison, PA-C, assisted during the entire case and assistance was crucial for facilitating all aspects of  the case.   SHW D: 01/19/2021 10:05:43 pm T: 01/20/2021 2:01:00 am  JOB: [id number]/ [id number]

## 2021-01-21 ENCOUNTER — Inpatient Hospital Stay (HOSPITAL_COMMUNITY): Payer: Medicare (Managed Care)

## 2021-01-21 DIAGNOSIS — R5082 Postprocedural fever: Secondary | ICD-10-CM

## 2021-01-21 LAB — CBC
HCT: 35 % — ABNORMAL LOW (ref 36.0–46.0)
Hemoglobin: 11.8 g/dL — ABNORMAL LOW (ref 12.0–15.0)
MCH: 30.3 pg (ref 26.0–34.0)
MCHC: 33.7 g/dL (ref 30.0–36.0)
MCV: 90 fL (ref 80.0–100.0)
Platelets: 97 10*3/uL — ABNORMAL LOW (ref 150–400)
RBC: 3.89 MIL/uL (ref 3.87–5.11)
RDW: 14.2 % (ref 11.5–15.5)
WBC: 4.7 10*3/uL (ref 4.0–10.5)
nRBC: 0 % (ref 0.0–0.2)

## 2021-01-21 LAB — PROCALCITONIN: Procalcitonin: 0.12 ng/mL

## 2021-01-21 MED ORDER — ASPIRIN 81 MG PO CHEW: 81.0000 mg | CHEWABLE_TABLET | Freq: Two times a day (BID) | ORAL | 0 refills | Status: DC

## 2021-01-21 MED ORDER — HYDROCODONE-ACETAMINOPHEN 5-325 MG PO TABS: 1.0000 | ORAL_TABLET | Freq: Four times a day (QID) | ORAL | 0 refills | Status: DC | PRN

## 2021-01-21 NOTE — TOC Progression Note (Signed)
Transition of Care [facility]) - Progression Note    Patient Details  Name: Elizabeth Tucker MRN: [id number] Date of Birth: 1946/01/11  [facility] CM/SW Contact  Ralene Bathe, LCSWA Phone Number: 01/21/2021, 1:56 PM  Clinical Narrative:    CSW called the patient's emergency contact Glee Arvin), due to the patient's Dementia, to present bed offers.  CSW also emailed the names of the agencies that have accepted the patient so that Ms. Blondell Reveal could research them.  Pending:  Bed choice   Expected Discharge Plan: Skilled Nursing Facility Barriers to Discharge: Continued Medical Work up,Insurance Authorization,SNF Pending bed offer  Expected Discharge Plan and Services Expected Discharge Plan: Skilled Nursing Facility       Living arrangements for the past 2 months: Assisted Living Facility                                       Social Determinants of Health (SDOH) Interventions    Readmission Risk Interventions No flowsheet data found.

## 2021-01-21 NOTE — Progress Notes (Signed)
[facility]                                                                              Patient Demographics  Elizabeth Tucker, is a 75 y.o. female, DOB - 1946/05/26, ZOX:[id number]  Admit date - 01/19/2021   Admitting Physician Jonah BlueJennifer Yates, MD  Outpatient Primary MD for the patient is Ngetich, Donalee Citrininah C, NP  Outpatient specialists:   Presented from: ALF  LOS - 2  days   Medical records reviewed and are as summarized below:    Chief Complaint  Patient presents with  . Fall       Brief summary   Patient is a 75 year old female with history of dementia, Alzheimer's disease, GERD, prior history of hepatitis C, presented with unwitnessed fall.  Patient lives in a assisted living facility.  Per EMS report, she fell onto tile.  Subsequently complained of pain in her left hip. X-rays showed displaced femoral neck fracture on the left side, orthopedics was consulted.  Assessment & Plan    Principal Problem: Unwitnessed mechanical fall with closed left hip fracture, initial encounter [facility](HCC) - Orthopedics consulted, underwent left total hip arthroplasty 6/1 overnight - Per orthopedics, aspirin 81 mg twice daily for DVT coverage.  Weightbearing as tolerated on the left hip. -PT evaluation recommended skilled nursing facility, patient from ALF.  TOC assisting with SNF -H&H currently stable, pain controlled, appears comfortable   Active Problems: Fevers -Spiking temp today, Tmax 101.1 F -UA 5/31 had shown no UTI, chest x-ray on admission showed no pneumonia.  -No abdominal pain or reports of any diarrhea -Will obtain procalcitonin, blood cultures x2, portable chest x-ray  -Hold off on any antibiotics until source clear    Dementia with behavioral disturbance (HCC) -No acute issues, stable.  Continue delirium precautions. -Continue Geodon 40 mg daily, trazodone 50 mg at bedtime, Ativan 1 mg every 6 hours as needed, 0.5 mg twice daily -Haldol as needed for  agitation     Class 2 obesity due to excess calories with body mass index (BMI) of 36.0 to 36.9 in adult Estimated body mass index is 36.94 kg/m as calculated from the following:   Height as of this encounter: 5\' 5"  (1.651 m).   Weight as of this encounter: 100.7 kg.  Code Status: Full CODE STATUS DVT Prophylaxis:  SCDs Start: 01/20/21 0141 Place TED hose Start: 01/20/21 0141 SCDs Start: 01/19/21 0958   Level of Care: Level of care: Med-Surg Family Communication: Discussed all imaging results, lab results, explained to the patient's close friend, Ms Scheryl DarterLaToya Winslow on 6/1.    Disposition Plan:     Status is: Inpatient Remains inpatient appropriate because:Inpatient level of care appropriate due to severity of illness   Dispo: The patient is from: ALF              Anticipated d/c is to: SNF              Patient currently is not medically stable to d/c.  Awaiting SNF   difficult to place patient No  Time Spent in minutes: 25 minutes  Procedures:  Left total hip arthroplasty 6/1  Consultants:  Orthopedics  Antimicrobials:   Anti-infectives (From admission, onward)   Start     Dose/Rate Route Frequency Ordered Stop   01/20/21 0600  ceFAZolin (ANCEF) IVPB 2g/100 mL premix        2 g 200 mL/hr over 30 Minutes Intravenous On call to O.R. 01/19/21 1740 01/19/21 2126   01/20/21 0300  ceFAZolin (ANCEF) IVPB 2g/100 mL premix        2 g 200 mL/hr over 30 Minutes Intravenous Every 6 hours 01/20/21 0140 01/20/21 1023         Medications  Scheduled Meds: . aspirin  81 mg Oral BID PC  . docusate sodium  100 mg Oral BID  . feeding supplement  237 mL Oral BID BM  . LORazepam  0.5 mg Oral BID  . multivitamin with minerals  1 tablet Oral Daily  . pantoprazole  40 mg Oral Daily  . traZODone  50 mg Oral QHS  . ziprasidone  40 mg Oral Daily   Continuous Infusions: . sodium chloride    . methocarbamol (ROBAXIN) IV     PRN Meds:.acetaminophen, acetaminophen, bisacodyl,  haloperidol, HYDROcodone-acetaminophen, HYDROcodone-acetaminophen, HYDROcodone-acetaminophen, LORazepam, menthol-cetylpyridinium **OR** phenol, methocarbamol **OR** methocarbamol (ROBAXIN) IV, metoCLOPramide **OR** metoCLOPramide (REGLAN) injection, morphine injection, morphine injection, ondansetron **OR** ondansetron (ZOFRAN) IV, polyethylene glycol      Subjective:   Elizabeth Tucker was seen and examined today.  Pleasant and cooperative, appears very comfortable.  Spiking low-grade fever, Tmax 101.1.  Patient has dementia, difficult to obtain review of system. Does not appear to have any chest pain or acute shortness of breath, abdominal pain or vomiting.  No reports of any diarrhea  Objective:   Vitals:   01/20/21 2016 01/21/21 0020 01/21/21 0512 01/21/21 0731  BP: 99/64 (!) 151/93 102/73 115/64  Pulse: 98 96 (!) 105 96  Resp: 18 20 16 16   Temp: 97.8 F (36.6 C) 98.2 F (36.8 C) 100 F (37.8 C) (!) 101.1 F (38.4 C)  TempSrc:  Oral Oral Oral  SpO2: 91% 96% 95% 95%  Weight:      Height:        Intake/Output Summary (Last 24 hours) at 01/21/2021 1300 Last data filed at 01/21/2021 0700 Gross per 24 hour  Intake --  Output 200 ml  Net -200 ml     Wt Readings from Last 3 Encounters:  01/19/21 100.7 kg  11/27/20 82.3 kg  05/26/20 85.8 kg   Physical Exam  General: Alert and oriented x self, dementia, pleasant  Cardiovascular: S1 S2 clear, RRR.  Respiratory: Diminished breath sound at the bases, no wheezing  Gastrointestinal: Soft, nontender, nondistended, NBS  Ext: no pedal edema bilaterally  Neuro: no new deficits  Skin: No rashes  Psych: dementia  Data Reviewed:  I have personally reviewed following labs and imaging studies  Micro Results Recent Results (from the past 240 hour(s))  Resp Panel by RT-PCR (Flu A&B, Covid) Nasopharyngeal Swab     Status: None   Collection Time: 01/19/21  7:38 AM   Specimen: Nasopharyngeal Swab; Nasopharyngeal(NP) swabs in vial  transport medium  Result Value Ref Range Status   SARS Coronavirus 2 by RT PCR NEGATIVE NEGATIVE Final    Comment: (NOTE) SARS-CoV-2 target nucleic acids are NOT DETECTED.  The SARS-CoV-2 RNA is generally detectable in upper respiratory specimens during the acute phase of infection. The lowest concentration of SARS-CoV-2 viral copies this assay can detect is 138 copies/mL. A negative result does not preclude SARS-Cov-2 infection and should not be used as  the sole basis for treatment or other patient management decisions. A negative result may occur with  improper specimen collection/handling, submission of specimen other than nasopharyngeal swab, presence of viral mutation(s) within the areas targeted by this assay, and inadequate number of viral copies(<138 copies/mL). A negative result must be combined with clinical observations, patient history, and epidemiological information. The expected result is Negative.  Fact Sheet for Patients:  BloggerCourse.com  Fact Sheet for Healthcare Providers:  SeriousBroker.it  This test is no t yet approved or cleared by the [place] FDA and  has been authorized for detection and/or diagnosis of SARS-CoV-2 by FDA under an Emergency Use Authorization (EUA). This EUA will remain  in effect (meaning this test can be used) for the duration of the COVID-19 declaration under Section 564(b)(1) of the Act, 21 U.S.C.section 360bbb-3(b)(1), unless the authorization is terminated  or revoked sooner.       Influenza A by PCR NEGATIVE NEGATIVE Final   Influenza B by PCR NEGATIVE NEGATIVE Final    Comment: (NOTE) The Xpert Xpress SARS-CoV-2/FLU/RSV plus assay is intended as an aid in the diagnosis of influenza from Nasopharyngeal swab specimens and should not be used as a sole basis for treatment. Nasal washings and aspirates are unacceptable for Xpert Xpress SARS-CoV-2/FLU/RSV testing.  Fact  Sheet for Patients: BloggerCourse.com  Fact Sheet for Healthcare Providers: SeriousBroker.it  This test is not yet approved or cleared by the [place] FDA and has been authorized for detection and/or diagnosis of SARS-CoV-2 by FDA under an Emergency Use Authorization (EUA). This EUA will remain in effect (meaning this test can be used) for the duration of the COVID-19 declaration under Section 564(b)(1) of the Act, 21 U.S.C. section 360bbb-3(b)(1), unless the authorization is terminated or revoked.  Performed at [facility], [address] [place], [place] [address]   Surgical pcr screen     Status: None   Collection Time: 01/19/21  6:21 PM   Specimen: Nasal Mucosa; Nasal Swab  Result Value Ref Range Status   MRSA, PCR NEGATIVE NEGATIVE Final   Staphylococcus aureus NEGATIVE NEGATIVE Final    Comment: (NOTE) The Xpert SA Assay (FDA approved for NASAL specimens in patients 34 years of age and older), is one component of a comprehensive surveillance program. It is not intended to diagnose infection nor to guide or monitor treatment. Performed at [facility], [address], [place], [place] [address]     Radiology Reports DG Chest 1 View  Result Date: 01/19/2021 CLINICAL DATA:  Preoperative exam.  Fall. EXAM: CHEST  1 VIEW COMPARISON:  No recent prior. FINDINGS: The heart size and mediastinal contours are within normal limits. Both lungs are clear. No acute bony abnormality. Degenerative changes scoliosis thoracic spine. IMPRESSION: No active disease. Electronically Signed   By: Maisie Fus  Register   On: 01/19/2021 07:36   CT HEAD WO CONTRAST  Result Date: 01/19/2021 CLINICAL DATA:  75 year old female with history of trauma from a fall with injury to the head. EXAM: CT HEAD WITHOUT CONTRAST CT CERVICAL SPINE WITHOUT CONTRAST TECHNIQUE: Multidetector CT imaging of the head and cervical spine was performed following  the standard protocol without intravenous contrast. Multiplanar CT image reconstructions of the cervical spine were also generated. COMPARISON:  No priors. FINDINGS: CT HEAD FINDINGS Brain: Very mild cerebral atrophy. Patchy areas of mild decreased attenuation are noted throughout the periventricular white matter of the cerebral hemispheres bilaterally, compatible with chronic microvascular ischemic disease. No evidence of acute infarction,  hemorrhage, hydrocephalus, extra-axial collection or mass lesion/mass effect. Vascular: No hyperdense vessel or unexpected calcification. Skull: Normal. Negative for fracture or focal lesion. Sinuses/Orbits: No acute finding. Other: None. CT CERVICAL SPINE FINDINGS Alignment: Normal. Skull base and vertebrae: No acute fracture. No primary bone lesion or focal pathologic process. Soft tissues and spinal canal: No prevertebral fluid or swelling. No visible canal hematoma. Disc levels: Multilevel degenerative disc disease, most pronounced at C6-C7 and C7-T1. Mild multilevel facet arthropathy. Upper chest: Unremarkable. Other: None. IMPRESSION: 1. No evidence of significant acute traumatic injury to the skull, brain or cervical spine. 2. Very mild cerebral atrophy and chronic microvascular ischemic changes in the periventricular white matter of the cerebral hemispheres. 3. Multilevel degenerative disc disease and cervical spondylosis, as above. Electronically Signed   By: Trudie Reed M.D.   On: 01/19/2021 07:48   CT CERVICAL SPINE WO CONTRAST  Result Date: 01/19/2021 CLINICAL DATA:  75 year old female with history of trauma from a fall with injury to the head. EXAM: CT HEAD WITHOUT CONTRAST CT CERVICAL SPINE WITHOUT CONTRAST TECHNIQUE: Multidetector CT imaging of the head and cervical spine was performed following the standard protocol without intravenous contrast. Multiplanar CT image reconstructions of the cervical spine were also generated. COMPARISON:  No priors.  FINDINGS: CT HEAD FINDINGS Brain: Very mild cerebral atrophy. Patchy areas of mild decreased attenuation are noted throughout the periventricular white matter of the cerebral hemispheres bilaterally, compatible with chronic microvascular ischemic disease. No evidence of acute infarction, hemorrhage, hydrocephalus, extra-axial collection or mass lesion/mass effect. Vascular: No hyperdense vessel or unexpected calcification. Skull: Normal. Negative for fracture or focal lesion. Sinuses/Orbits: No acute finding. Other: None. CT CERVICAL SPINE FINDINGS Alignment: Normal. Skull base and vertebrae: No acute fracture. No primary bone lesion or focal pathologic process. Soft tissues and spinal canal: No prevertebral fluid or swelling. No visible canal hematoma. Disc levels: Multilevel degenerative disc disease, most pronounced at C6-C7 and C7-T1. Mild multilevel facet arthropathy. Upper chest: Unremarkable. Other: None. IMPRESSION: 1. No evidence of significant acute traumatic injury to the skull, brain or cervical spine. 2. Very mild cerebral atrophy and chronic microvascular ischemic changes in the periventricular white matter of the cerebral hemispheres. 3. Multilevel degenerative disc disease and cervical spondylosis, as above. Electronically Signed   By: Trudie Reed M.D.   On: 01/19/2021 07:48   Pelvis Portable  Result Date: 01/19/2021 CLINICAL DATA:  Postop left hip EXAM: PORTABLE PELVIS 1-2 VIEWS COMPARISON:  01/19/2021 FINDINGS: Interval left hip replacement with intact hardware and normal alignment. Gas in the soft tissues consistent with recent surgery. IMPRESSION: Interval left hip replacement with expected postsurgical change Electronically Signed   By: Jasmine Pang M.D.   On: 01/19/2021 23:32   DG C-Arm 1-60 Min  Result Date: 01/19/2021 CLINICAL DATA:  Anterior left hip arthroplasty EXAM: DG C-ARM 1-60 MIN; DG HIP (WITH OR WITHOUT PELVIS) 2-3V LEFT FLUOROSCOPY TIME:  Fluoroscopy Time:  33 seconds  Radiation Exposure Index (if provided by the fluoroscopic device): 1.69 mGy Number of Acquired Spot Images: 3 COMPARISON:  01/19/2021 FINDINGS: Three fluoroscopic images are obtained during the performance of the procedure and are provided for interpretation only. A left hip arthroplasty is identified in the expected position without signs of complication. IMPRESSION: 1. Unremarkable left hip arthroplasty. Please refer to the operative report. Electronically Signed   By: Sharlet Salina M.D.   On: 01/19/2021 22:14   DG HIP UNILAT WITH PELVIS 2-3 VIEWS LEFT  Result Date: 01/19/2021 CLINICAL DATA:  Anterior left  hip arthroplasty EXAM: DG C-ARM 1-60 MIN; DG HIP (WITH OR WITHOUT PELVIS) 2-3V LEFT FLUOROSCOPY TIME:  Fluoroscopy Time:  33 seconds Radiation Exposure Index (if provided by the fluoroscopic device): 1.69 mGy Number of Acquired Spot Images: 3 COMPARISON:  01/19/2021 FINDINGS: Three fluoroscopic images are obtained during the performance of the procedure and are provided for interpretation only. A left hip arthroplasty is identified in the expected position without signs of complication. IMPRESSION: 1. Unremarkable left hip arthroplasty. Please refer to the operative report. Electronically Signed   By: Sharlet Salina M.D.   On: 01/19/2021 22:14   DG Hip Unilat With Pelvis 2-3 Views Left  Result Date: 01/19/2021 CLINICAL DATA:  75 year old female with left side hip pain and rotation. Possible fracture. Preoperative exam. EXAM: DG HIP (WITH OR WITHOUT PELVIS) 2-3V LEFT COMPARISON:  None. FINDINGS: Left femoral head is normally located. But a left femoral neck fracture is visible on image 3 with mild impaction. The left intertrochanteric segment and proximal left femoral shaft remain intact. No pelvis fracture. SI joints appear normal. Grossly intact proximal right femur. Negative visible bowel gas pattern.  Incidental pelvic phleboliths. IMPRESSION: Positive for left femoral neck fracture with mild  impaction. Electronically Signed   By: Odessa Fleming M.D.   On: 01/19/2021 07:43    Lab Data:  CBC: Recent Labs  Lab 01/19/21 0702 01/20/21 0232  WBC 5.1 8.0  NEUTROABS 4.1  --   HGB 14.2 13.3  HCT 41.8 38.6  MCV 89.1 88.3  PLT PLATELET CLUMPS NOTED ON SMEAR, UNABLE TO ESTIMATE 104*   Basic Metabolic Panel: Recent Labs  Lab 01/19/21 0702 01/20/21 0232  NA 135 135  K 3.8 4.0  CL 103 101  CO2 24 23  GLUCOSE 122* 122*  BUN 8 10  CREATININE 0.75 0.71  CALCIUM 9.2 8.7*   GFR: Estimated Creatinine Clearance: 74.8 mL/min (by C-G formula based on SCr of 0.71 mg/dL). Liver Function Tests: No results for input(s): AST, ALT, ALKPHOS, BILITOT, PROT, ALBUMIN in the last 168 hours. No results for input(s): LIPASE, AMYLASE in the last 168 hours. No results for input(s): AMMONIA in the last 168 hours. Coagulation Profile: Recent Labs  Lab 01/19/21 0702  INR 1.1   Cardiac Enzymes: No results for input(s): CKTOTAL, CKMB, CKMBINDEX, TROPONINI in the last 168 hours. BNP (last 3 results) No results for input(s): PROBNP in the last 8760 hours. HbA1C: No results for input(s): HGBA1C in the last 72 hours. CBG: Recent Labs  Lab 01/20/21 1139  GLUCAP 136*   Lipid Profile: No results for input(s): CHOL, HDL, LDLCALC, TRIG, CHOLHDL, LDLDIRECT in the last 72 hours. Thyroid Function Tests: No results for input(s): TSH, T4TOTAL, FREET4, T3FREE, THYROIDAB in the last 72 hours. Anemia Panel: No results for input(s): VITAMINB12, FOLATE, FERRITIN, TIBC, IRON, RETICCTPCT in the last 72 hours. Urine analysis:    Component Value Date/Time   COLORURINE YELLOW 01/19/2021 1001   APPEARANCEUR CLEAR 01/19/2021 1001   LABSPEC 1.013 01/19/2021 1001   PHURINE 6.0 01/19/2021 1001   GLUCOSEU NEGATIVE 01/19/2021 1001   HGBUR NEGATIVE 01/19/2021 1001   HGBUR negative 02/01/2008 1038   BILIRUBINUR NEGATIVE 01/19/2021 1001   KETONESUR NEGATIVE 01/19/2021 1001   PROTEINUR NEGATIVE 01/19/2021 1001    UROBILINOGEN 1.0 08/14/2008 0851   NITRITE NEGATIVE 01/19/2021 1001   LEUKOCYTESUR TRACE (A) 01/19/2021 1001     Emmalea Treanor M.D. [facility] 01/21/2021, 1:00 PM  Available via Epic secure chat 7am-7pm After 7 pm, please refer  to night coverage provider listed on amion.

## 2021-01-21 NOTE — Progress Notes (Signed)
Patient ID: Elizabeth Tucker, female   DOB: 1946/01/05, 75 y.o.   MRN: [id number] The patient is awake this morning and eating breakfast.  She is pleasantly demented and likely at her baseline.  Her left operative hip is stable.  The dressing is clean.  Her leg lengths are equal.  She does report some left hip pain that is appropriate.  From an orthopedic standpoint, she can return to her previous skilled nursing facility.  I have printed out prescriptions for 81 mg aspirin twice daily and hydrocodone as needed for pain.  I will see her back in the office in 2 weeks for follow-up.

## 2021-01-21 NOTE — Progress Notes (Signed)
Physical Therapy Treatment Patient Details Name: Elizabeth Tucker MRN: [id number] DOB: February 07, 1946 Today's Date: 01/21/2021    History of Present Illness 75 yo presented to the ED 01/19/21 with a L hip fracture from an unwitnessed fall. She underwent direct anterior approach L THA 01/19/21. PMH consists of Alzheimer's disease, GERD, and hep C.    PT Comments    Pt was seen for mobility to get onto walker and then [facility].  Pt is precarious with standing on walker and BSC, with a bari BSC being a better choice.  Follow along with her to continue balance practice, training for control of standing, transfers to and from bed and BSC, and to increase shifting balance control for steps.  Pt is going to SNF but will progress her gait as tolerated to allow a faster transition to home.  Follow for acute PT goals as are below.   Follow Up Recommendations  SNF;Supervision/Assistance - 24 hour     Equipment Recommendations  Rolling walker with 5" wheels;Wheelchair (measurements PT);Wheelchair cushion (measurements PT);3in1 (PT)    Recommendations for Other Services       Precautions / Restrictions Precautions Precautions: Fall Precaution Comments: dementia Restrictions Weight Bearing Restrictions: Yes LLE Weight Bearing: Weight bearing as tolerated    Mobility  Bed Mobility Overal bed mobility: Needs Assistance Bed Mobility: Supine to Sit;Sit to Supine     Supine to sit: Mod assist;Max assist Sit to supine: Mod assist   General bed mobility comments: incremental movements, bed pad to finish scooting    Transfers Overall transfer level: Needs assistance Equipment used: Rolling walker (2 wheeled) Transfers: Sit to/from [employer] Sit to Stand: Mod assist;From elevated surface Stand pivot transfers: Mod assist       General transfer comment: cued sequence and hand placement the entire movement  Ambulation/Gait             General Gait Details: unable but does  sideshuffle to chair   Stairs             Wheelchair Mobility    Modified Rankin (Stroke Patients Only)       Balance Overall balance assessment: Needs assistance Sitting-balance support: Feet supported;Bilateral upper extremity supported Sitting balance-Leahy Scale: Fair     Standing balance support: Bilateral upper extremity supported;During functional activity Standing balance-Leahy Scale: Poor                              Cognition Arousal/Alertness: Awake/alert Behavior During Therapy: WFL for tasks assessed/performed Overall Cognitive Status: No family/caregiver present to determine baseline cognitive functioning                                 General Comments: dementia at baseline, pleasantly confused      Exercises      General Comments        Pertinent Vitals/Pain Pain Assessment: Faces Faces Pain Scale: Hurts little more Pain Location: L hip with mobility Pain Descriptors / Indicators: Guarding;Grimacing Pain Intervention(s): Limited activity within patient's tolerance;Monitored during session;Premedicated before session;Repositioned    Home Living                      Prior Function            PT Goals (current goals can now be found in the care plan section) Acute Rehab PT Goals Patient Stated  Goal: not stated Progress towards PT goals: Progressing toward goals    Frequency    Min 3X/week      PT Plan Current plan remains appropriate    Co-evaluation              AM-PAC PT "6 Clicks" Mobility   Outcome Measure  Help needed turning from your back to your side while in a flat bed without using bedrails?: A Lot Help needed moving from lying on your back to sitting on the side of a flat bed without using bedrails?: A Lot Help needed moving to and from a bed to a chair (including a wheelchair)?: A Lot Help needed standing up from a chair using your arms (e.g., wheelchair or bedside  chair)?: A Lot Help needed to walk in hospital room?: A Lot Help needed climbing 3-5 steps with a railing? : Total 6 Click Score: 11    End of Session Equipment Utilized During Treatment: Gait belt Activity Tolerance: Patient limited by fatigue;Patient limited by lethargy Patient left: in bed;with call bell/phone within reach;with bed alarm set Nurse Communication: Mobility status PT Visit Diagnosis: Other abnormalities of gait and mobility (R26.89);History of falling (Z91.81);Difficulty in walking, not elsewhere classified (R26.2);Pain Pain - Right/Left: Left Pain - part of body: Hip     Time: 2992-4268 PT Time Calculation (min) (ACUTE ONLY): 30 min  Charges:  $Therapeutic Activity: 23-37 mins                  Ivar Drape 01/21/2021, 3:45 PM Samul Dada, PT MS Acute Rehab Dept. Number: [facility] [id number] and [facility] [phone]

## 2021-01-22 DIAGNOSIS — F0281 Dementia in other diseases classified elsewhere with behavioral disturbance: Secondary | ICD-10-CM

## 2021-01-22 NOTE — Progress Notes (Signed)
[facility]                                                                              Patient Demographics  Elizabeth Tucker, is a 75 y.o. female, DOB - 08-Sep-1945, ZOX:[id number]  Admit date - 01/19/2021   Admitting Physician Jonah Blue, MD  Outpatient Primary MD for the patient is Ngetich, Donalee Citrin, NP  Outpatient specialists:   Presented from: ALF  LOS - 3  days   Medical records reviewed and are as summarized below:    Chief Complaint  Patient presents with  . Fall       Brief summary   Patient is a 75 year old female with history of dementia, Alzheimer's disease, GERD, prior history of hepatitis C, presented with unwitnessed fall.  Patient lives in a assisted living facility.  Per EMS report, she fell onto tile.  Subsequently complained of pain in her left hip. X-rays showed displaced femoral neck fracture on the left side, orthopedics was consulted.  Assessment & Plan    Principal Problem: Unwitnessed mechanical fall with closed left hip fracture, initial encounter [facility]) - Orthopedics consulted, underwent left total hip arthroplasty 6/1 overnight - Per orthopedics, aspirin 81 mg twice daily for DVT coverage.  Weightbearing as tolerated on the left hip. -PT evaluation recommended skilled nursing facility, patient from ALF.  TOC assisting with SNF -Pain controlled, continue bowel regimen -H&H stable   Active Problems: Fevers -Spiked a temp on 6/2 however no fevers for 24 hours -UA 5/31 had shown no UTI, chest x-ray on admission showed no pneumonia.  -No infectious signs, no leukocytosis.  Repeat chest x-ray clear     Dementia with behavioral disturbance (HCC) -Continue Geodon 40 mg daily, trazodone 50 mg at bedtime, Ativan 1 mg every 6 hours as needed, 0.5 mg twice daily -Haldol as needed for agitation -Currently stable, no acute issues.    Class 2 obesity due to excess calories with body mass index (BMI) of 36.0 to 36.9 in  adult Estimated body mass index is 36.94 kg/m as calculated from the following:   Height as of this encounter:  (1.651 m).   Weight as of this encounter: 100.7 kg.  Code Status: Full CODE STATUS DVT Prophylaxis:  SCDs Start: 01/20/21 0141 Place TED hose Start: 01/20/21 0141 SCDs Start: 01/19/21 0958   Level of Care: Level of care: Med-Surg Family Communication: Discussed all imaging results, lab results, explained to the patient's close friend, Ms Scheryl Darter on 6/1.    Disposition Plan:     Status is: Inpatient Remains inpatient appropriate because:Inpatient level of care appropriate due to severity of illness   Dispo: The patient is from: ALF              Anticipated d/c is to: SNF              Patient currently is not medically stable to d/c.  Awaiting skilled nursing facility placement   difficult to place patient No  Time Spent in minutes: 25 minutes  Procedures:  Left total hip arthroplasty 6/1  Consultants:   Orthopedics  Antimicrobials:   Anti-infectives (From admission, onward)  Start     Dose/Rate Route Frequency Ordered Stop   01/20/21 0600  ceFAZolin (ANCEF) IVPB 2g/100 mL premix        2 g 200 mL/hr over 30 Minutes Intravenous On call to O.R. 01/19/21 1740 01/19/21 2126   01/20/21 0300  ceFAZolin (ANCEF) IVPB 2g/100 mL premix        2 g 200 mL/hr over 30 Minutes Intravenous Every 6 hours 01/20/21 0140 01/20/21 1023         Medications  Scheduled Meds: . aspirin  81 mg Oral BID PC  . docusate sodium  100 mg Oral BID  . feeding supplement  237 mL Oral BID BM  . LORazepam  0.5 mg Oral BID  . multivitamin with minerals  1 tablet Oral Daily  . pantoprazole  40 mg Oral Daily  . traZODone  50 mg Oral QHS  . ziprasidone  40 mg Oral Daily   Continuous Infusions: . sodium chloride    . methocarbamol (ROBAXIN) IV     PRN Meds:.acetaminophen, acetaminophen, bisacodyl, haloperidol, HYDROcodone-acetaminophen, HYDROcodone-acetaminophen,  HYDROcodone-acetaminophen, LORazepam, menthol-cetylpyridinium **OR** phenol, methocarbamol **OR** methocarbamol (ROBAXIN) IV, metoCLOPramide **OR** metoCLOPramide (REGLAN) injection, morphine injection, morphine injection, ondansetron **OR** ondansetron (ZOFRAN) IV, polyethylene glycol      Subjective:   Vena RuaCarol Apollo was seen and examined today.  Pleasant and cooperative, no fevers today.  No diarrhea.  Dementia, difficult to obtain review of system.  Objective:   Vitals:   01/21/21 0731 01/21/21 1356 01/21/21 2143 01/22/21 0752  BP: 115/64 100/62 111/76 (!) 100/58  Pulse: 96 92 98 97  Resp: 16 16 16 18   Temp: (!) 101.1 F (38.4 C) 99.6 F (37.6 C) 97.7 F (36.5 C) 97.8 F (36.6 C)  TempSrc: Oral  Oral Oral  SpO2: 95% 99% 97% 96%  Weight:      Height:        Intake/Output Summary (Last 24 hours) at 01/22/2021 1600 Last data filed at 01/22/2021 1100 Gross per 24 hour  Intake 240 ml  Output --  Net 240 ml     Wt Readings from Last 3 Encounters:  01/19/21 100.7 kg  11/27/20 82.3 kg  05/26/20 85.8 kg     Physical Exam  General: Alert and oriented x self, pleasant, dementia  Cardiovascular: S1 S2 clear, RRR. No pedal edema b/l  Respiratory: CTA B  Gastrointestinal: Soft, nontender, nondistended, NBS  Ext: no pedal edema bilaterally  Neuro: no new deficits  Skin: No rashes  Psych: dementia   Data Reviewed:  I have personally reviewed following labs and imaging studies  Micro Results Recent Results (from the past 240 hour(s))  Resp Panel by RT-PCR (Flu A&B, Covid) Nasopharyngeal Swab     Status: None   Collection Time: 01/19/21  7:38 AM   Specimen: Nasopharyngeal Swab; Nasopharyngeal(NP) swabs in vial transport medium  Result Value Ref Range Status   SARS Coronavirus 2 by RT PCR NEGATIVE NEGATIVE Final    Comment: (NOTE) SARS-CoV-2 target nucleic acids are NOT DETECTED.  The SARS-CoV-2 RNA is generally detectable in upper respiratory specimens during  the acute phase of infection. The lowest concentration of SARS-CoV-2 viral copies this assay can detect is 138 copies/mL. A negative result does not preclude SARS-Cov-2 infection and should not be used as the sole basis for treatment or other patient management decisions. A negative result may occur with  improper specimen collection/handling, submission of specimen other than nasopharyngeal swab, presence of viral mutation(s) within the areas targeted by this assay, and  inadequate number of viral copies(<138 copies/mL). A negative result must be combined with clinical observations, patient history, and epidemiological information. The expected result is Negative.  Fact Sheet for Patients:  BloggerCourse.com  Fact Sheet for Healthcare Providers:  SeriousBroker.it  This test is no t yet approved or cleared by the [place] FDA and  has been authorized for detection and/or diagnosis of SARS-CoV-2 by FDA under an Emergency Use Authorization (EUA). This EUA will remain  in effect (meaning this test can be used) for the duration of the COVID-19 declaration under Section 564(b)(1) of the Act, 21 U.S.C.section 360bbb-3(b)(1), unless the authorization is terminated  or revoked sooner.       Influenza A by PCR NEGATIVE NEGATIVE Final   Influenza B by PCR NEGATIVE NEGATIVE Final    Comment: (NOTE) The Xpert Xpress SARS-CoV-2/FLU/RSV plus assay is intended as an aid in the diagnosis of influenza from Nasopharyngeal swab specimens and should not be used as a sole basis for treatment. Nasal washings and aspirates are unacceptable for Xpert Xpress SARS-CoV-2/FLU/RSV testing.  Fact Sheet for Patients: BloggerCourse.com  Fact Sheet for Healthcare Providers: SeriousBroker.it  This test is not yet approved or cleared by the [place] FDA and has been authorized for detection and/or  diagnosis of SARS-CoV-2 by FDA under an Emergency Use Authorization (EUA). This EUA will remain in effect (meaning this test can be used) for the duration of the COVID-19 declaration under Section 564(b)(1) of the Act, 21 U.S.C. section 360bbb-3(b)(1), unless the authorization is terminated or revoked.  Performed at [facility], [address], [place], [place] [address]   Surgical pcr screen     Status: None   Collection Time: 01/19/21  6:21 PM   Specimen: Nasal Mucosa; Nasal Swab  Result Value Ref Range Status   MRSA, PCR NEGATIVE NEGATIVE Final   Staphylococcus aureus NEGATIVE NEGATIVE Final    Comment: (NOTE) The Xpert SA Assay (FDA approved for NASAL specimens in patients 36 years of age and older), is one component of a comprehensive surveillance program. It is not intended to diagnose infection nor to guide or monitor treatment. Performed at [facility], [address], [place], [place] [address]   Culture, blood (Routine X 2) w Reflex to ID Panel     Status: None (Preliminary result)   Collection Time: 01/21/21  1:34 PM   Specimen: BLOOD LEFT ARM  Result Value Ref Range Status   Specimen Description BLOOD LEFT ARM  Final   Special Requests   Final    BOTTLES DRAWN AEROBIC AND ANAEROBIC Blood Culture adequate volume   Culture   Final    NO GROWTH < 24 HOURS Performed at [facility], [address], [place], [place] [address]    Report Status PENDING  Incomplete  Culture, blood (Routine X 2) w Reflex to ID Panel     Status: None (Preliminary result)   Collection Time: 01/21/21  1:42 PM   Specimen: BLOOD LEFT ARM  Result Value Ref Range Status   Specimen Description BLOOD LEFT ARM  Final   Special Requests   Final    BOTTLES DRAWN AEROBIC ONLY Blood Culture results may not be optimal due to an inadequate volume of blood received in culture bottles   Culture   Final    NO GROWTH < 24 HOURS Performed at [facility], [address],  [place], [place] [address]    Report Status PENDING  Incomplete  Radiology Reports DG Chest 1 View  Result Date: 01/19/2021 CLINICAL DATA:  Preoperative exam.  Fall. EXAM: CHEST  1 VIEW COMPARISON:  No recent prior. FINDINGS: The heart size and mediastinal contours are within normal limits. Both lungs are clear. No acute bony abnormality. Degenerative changes scoliosis thoracic spine. IMPRESSION: No active disease. Electronically Signed   By: Maisie Fus  Register   On: 01/19/2021 07:36   CT HEAD WO CONTRAST  Result Date: 01/19/2021 CLINICAL DATA:  75 year old female with history of trauma from a fall with injury to the head. EXAM: CT HEAD WITHOUT CONTRAST CT CERVICAL SPINE WITHOUT CONTRAST TECHNIQUE: Multidetector CT imaging of the head and cervical spine was performed following the standard protocol without intravenous contrast. Multiplanar CT image reconstructions of the cervical spine were also generated. COMPARISON:  No priors. FINDINGS: CT HEAD FINDINGS Brain: Very mild cerebral atrophy. Patchy areas of mild decreased attenuation are noted throughout the periventricular white matter of the cerebral hemispheres bilaterally, compatible with chronic microvascular ischemic disease. No evidence of acute infarction, hemorrhage, hydrocephalus, extra-axial collection or mass lesion/mass effect. Vascular: No hyperdense vessel or unexpected calcification. Skull: Normal. Negative for fracture or focal lesion. Sinuses/Orbits: No acute finding. Other: None. CT CERVICAL SPINE FINDINGS Alignment: Normal. Skull base and vertebrae: No acute fracture. No primary bone lesion or focal pathologic process. Soft tissues and spinal canal: No prevertebral fluid or swelling. No visible canal hematoma. Disc levels: Multilevel degenerative disc disease, most pronounced at C6-C7 and C7-T1. Mild multilevel facet arthropathy. Upper chest: Unremarkable. Other: None. IMPRESSION: 1. No evidence of significant acute traumatic injury to the  skull, brain or cervical spine. 2. Very mild cerebral atrophy and chronic microvascular ischemic changes in the periventricular white matter of the cerebral hemispheres. 3. Multilevel degenerative disc disease and cervical spondylosis, as above. Electronically Signed   By: Trudie Reed M.D.   On: 01/19/2021 07:48   CT CERVICAL SPINE WO CONTRAST  Result Date: 01/19/2021 CLINICAL DATA:  75 year old female with history of trauma from a fall with injury to the head. EXAM: CT HEAD WITHOUT CONTRAST CT CERVICAL SPINE WITHOUT CONTRAST TECHNIQUE: Multidetector CT imaging of the head and cervical spine was performed following the standard protocol without intravenous contrast. Multiplanar CT image reconstructions of the cervical spine were also generated. COMPARISON:  No priors. FINDINGS: CT HEAD FINDINGS Brain: Very mild cerebral atrophy. Patchy areas of mild decreased attenuation are noted throughout the periventricular white matter of the cerebral hemispheres bilaterally, compatible with chronic microvascular ischemic disease. No evidence of acute infarction, hemorrhage, hydrocephalus, extra-axial collection or mass lesion/mass effect. Vascular: No hyperdense vessel or unexpected calcification. Skull: Normal. Negative for fracture or focal lesion. Sinuses/Orbits: No acute finding. Other: None. CT CERVICAL SPINE FINDINGS Alignment: Normal. Skull base and vertebrae: No acute fracture. No primary bone lesion or focal pathologic process. Soft tissues and spinal canal: No prevertebral fluid or swelling. No visible canal hematoma. Disc levels: Multilevel degenerative disc disease, most pronounced at C6-C7 and C7-T1. Mild multilevel facet arthropathy. Upper chest: Unremarkable. Other: None. IMPRESSION: 1. No evidence of significant acute traumatic injury to the skull, brain or cervical spine. 2. Very mild cerebral atrophy and chronic microvascular ischemic changes in the periventricular white matter of the cerebral  hemispheres. 3. Multilevel degenerative disc disease and cervical spondylosis, as above. Electronically Signed   By: Trudie Reed M.D.   On: 01/19/2021 07:48   Pelvis Portable  Result Date: 01/19/2021 CLINICAL DATA:  Postop left hip EXAM: PORTABLE PELVIS 1-2 VIEWS COMPARISON:  01/19/2021 FINDINGS:  Interval left hip replacement with intact hardware and normal alignment. Gas in the soft tissues consistent with recent surgery. IMPRESSION: Interval left hip replacement with expected postsurgical change Electronically Signed   By: Jasmine Pang M.D.   On: 01/19/2021 23:32   DG Chest Port 1 View  Result Date: 01/21/2021 CLINICAL DATA:  Fever EXAM: PORTABLE CHEST 1 VIEW COMPARISON:  Portable exam 1411 hours compared to 01/19/2021 FINDINGS: Upper normal heart size. Mediastinal contours and pulmonary vascularity normal. Atherosclerotic calcification aorta. Minimal bibasilar atelectasis. Lungs otherwise clear. No infiltrate, pleural effusion, or pneumothorax. Bones demineralized. IMPRESSION: Minimal bibasilar atelectasis. Aortic Atherosclerosis (ICD10-I70.0). Electronically Signed   By: Ulyses Southward M.D.   On: 01/21/2021 14:26   DG C-Arm 1-60 Min  Result Date: 01/19/2021 CLINICAL DATA:  Anterior left hip arthroplasty EXAM: DG C-ARM 1-60 MIN; DG HIP (WITH OR WITHOUT PELVIS) 2-3V LEFT FLUOROSCOPY TIME:  Fluoroscopy Time:  33 seconds Radiation Exposure Index (if provided by the fluoroscopic device): 1.69 mGy Number of Acquired Spot Images: 3 COMPARISON:  01/19/2021 FINDINGS: Three fluoroscopic images are obtained during the performance of the procedure and are provided for interpretation only. A left hip arthroplasty is identified in the expected position without signs of complication. IMPRESSION: 1. Unremarkable left hip arthroplasty. Please refer to the operative report. Electronically Signed   By: Sharlet Salina M.D.   On: 01/19/2021 22:14   DG HIP UNILAT WITH PELVIS 2-3 VIEWS LEFT  Result Date:  01/19/2021 CLINICAL DATA:  Anterior left hip arthroplasty EXAM: DG C-ARM 1-60 MIN; DG HIP (WITH OR WITHOUT PELVIS) 2-3V LEFT FLUOROSCOPY TIME:  Fluoroscopy Time:  33 seconds Radiation Exposure Index (if provided by the fluoroscopic device): 1.69 mGy Number of Acquired Spot Images: 3 COMPARISON:  01/19/2021 FINDINGS: Three fluoroscopic images are obtained during the performance of the procedure and are provided for interpretation only. A left hip arthroplasty is identified in the expected position without signs of complication. IMPRESSION: 1. Unremarkable left hip arthroplasty. Please refer to the operative report. Electronically Signed   By: Sharlet Salina M.D.   On: 01/19/2021 22:14   DG Hip Unilat With Pelvis 2-3 Views Left  Result Date: 01/19/2021 CLINICAL DATA:  75 year old female with left side hip pain and rotation. Possible fracture. Preoperative exam. EXAM: DG HIP (WITH OR WITHOUT PELVIS) 2-3V LEFT COMPARISON:  None. FINDINGS: Left femoral head is normally located. But a left femoral neck fracture is visible on image 3 with mild impaction. The left intertrochanteric segment and proximal left femoral shaft remain intact. No pelvis fracture. SI joints appear normal. Grossly intact proximal right femur. Negative visible bowel gas pattern.  Incidental pelvic phleboliths. IMPRESSION: Positive for left femoral neck fracture with mild impaction. Electronically Signed   By: Odessa Fleming M.D.   On: 01/19/2021 07:43    Lab Data:  CBC: Recent Labs  Lab 01/19/21 0702 01/20/21 0232 01/21/21 1334  WBC 5.1 8.0 4.7  NEUTROABS 4.1  --   --   HGB 14.2 13.3 11.8*  HCT 41.8 38.6 35.0*  MCV 89.1 88.3 90.0  PLT PLATELET CLUMPS NOTED ON SMEAR, UNABLE TO ESTIMATE 104* 97*   Basic Metabolic Panel: Recent Labs  Lab 01/19/21 0702 01/20/21 0232  NA 135 135  K 3.8 4.0  CL 103 101  CO2 24 23  GLUCOSE 122* 122*  BUN 8 10  CREATININE 0.75 0.71  CALCIUM 9.2 8.7*   GFR: Estimated Creatinine Clearance: 74.8  mL/min (by C-G formula based on SCr of 0.71 mg/dL). Liver  Function Tests: No results for input(s): AST, ALT, ALKPHOS, BILITOT, PROT, ALBUMIN in the last 168 hours. No results for input(s): LIPASE, AMYLASE in the last 168 hours. No results for input(s): AMMONIA in the last 168 hours. Coagulation Profile: Recent Labs  Lab 01/19/21 0702  INR 1.1   Cardiac Enzymes: No results for input(s): CKTOTAL, CKMB, CKMBINDEX, TROPONINI in the last 168 hours. BNP (last 3 results) No results for input(s): PROBNP in the last 8760 hours. HbA1C: No results for input(s): HGBA1C in the last 72 hours. CBG: Recent Labs  Lab 01/20/21 1139  GLUCAP 136*   Lipid Profile: No results for input(s): CHOL, HDL, LDLCALC, TRIG, CHOLHDL, LDLDIRECT in the last 72 hours. Thyroid Function Tests: No results for input(s): TSH, T4TOTAL, FREET4, T3FREE, THYROIDAB in the last 72 hours. Anemia Panel: No results for input(s): VITAMINB12, FOLATE, FERRITIN, TIBC, IRON, RETICCTPCT in the last 72 hours. Urine analysis:    Component Value Date/Time   COLORURINE YELLOW 01/19/2021 1001   APPEARANCEUR CLEAR 01/19/2021 1001   LABSPEC 1.013 01/19/2021 1001   PHURINE 6.0 01/19/2021 1001   GLUCOSEU NEGATIVE 01/19/2021 1001   HGBUR NEGATIVE 01/19/2021 1001   HGBUR negative 02/01/2008 1038   BILIRUBINUR NEGATIVE 01/19/2021 1001   KETONESUR NEGATIVE 01/19/2021 1001   PROTEINUR NEGATIVE 01/19/2021 1001   UROBILINOGEN 1.0 08/14/2008 0851   NITRITE NEGATIVE 01/19/2021 1001   LEUKOCYTESUR TRACE (A) 01/19/2021 1001     Tyhesha Dutson M.D. [facility] 01/22/2021, 4:00 PM  Available via Epic secure chat 7am-7pm After 7 pm, please refer to night coverage provider listed on amion.

## 2021-01-22 NOTE — TOC Progression Note (Signed)
Transition of Care [facility]) - Progression Note    Patient Details  Name: Elizabeth Tucker MRN: [id number] Date of Birth: March 31, 1946  Transition of Care [facility]) CM/SW Contact  Ralene Bathe, LCSWA Phone Number: 01/22/2021, 3:06 PM  Clinical Narrative:     RE: PASSR request for [name] Date of Birth: 1945/10/19 Date: 01/22/2021  Please be advised that the above-named patient has a primary diagnosis of dementia which supersedes any psychiatric diagnosis. Patient will require a short-term nursing home stay - anticipated 30 days or less for rehabilitation and strengthening.  The plan is for return to previous Assisted Living Facility.    Expected Discharge Plan: Skilled Nursing Facility Barriers to Discharge: Continued Medical Work up,Insurance Authorization,SNF Pending bed offer  Expected Discharge Plan and Services Expected Discharge Plan: Skilled Nursing Facility       Living arrangements for the past 2 months: Assisted Living Facility                                       Social Determinants of Health (SDOH) Interventions    Readmission Risk Interventions No flowsheet data found.

## 2021-01-22 NOTE — TOC Progression Note (Signed)
Transition of Care [facility]) - Progression Note    Patient Details  Name: KARIANN WECKER MRN: [id number] Date of Birth: 1946-05-19  Transition of Care [facility]) CM/SW Contact  Ralene Bathe, LCSWA Phone Number: 01/22/2021, 2:37 PM  Clinical Narrative:     CSW received a message from the patient's god daughter/ emergency contact LaToya that [facility] has been chosen as the facility.  CSW called Olegario Messier with [facility] to inquire about bed availability.  The agency may be able to accept (the agency only has bed availability for vaccinated patients) and will begin insurance authorization.  CSW informed the patient's god daughter of this information and asked if the patient is vaccinated.  Glee Arvin will have verify with the ALF if the patient is fully vaccinated.  Expected Discharge Plan: Skilled Nursing Facility Barriers to Discharge: Continued Medical Work up,Insurance Authorization,SNF Pending bed offer  Expected Discharge Plan and Services Expected Discharge Plan: Skilled Nursing Facility       Living arrangements for the past 2 months: Assisted Living Facility                                       Social Determinants of Health (SDOH) Interventions    Readmission Risk Interventions No flowsheet data found.

## 2021-01-23 LAB — CBC
HCT: 30.4 % — ABNORMAL LOW (ref 36.0–46.0)
Hemoglobin: 10.3 g/dL — ABNORMAL LOW (ref 12.0–15.0)
MCH: 30.3 pg (ref 26.0–34.0)
MCHC: 33.9 g/dL (ref 30.0–36.0)
MCV: 89.4 fL (ref 80.0–100.0)
Platelets: 105 10*3/uL — ABNORMAL LOW (ref 150–400)
RBC: 3.4 MIL/uL — ABNORMAL LOW (ref 3.87–5.11)
RDW: 13.8 % (ref 11.5–15.5)
WBC: 3.6 10*3/uL — ABNORMAL LOW (ref 4.0–10.5)
nRBC: 0 % (ref 0.0–0.2)

## 2021-01-23 LAB — BASIC METABOLIC PANEL
Anion gap: 6 (ref 5–15)
BUN: 19 mg/dL (ref 8–23)
CO2: 29 mmol/L (ref 22–32)
Calcium: 8.6 mg/dL — ABNORMAL LOW (ref 8.9–10.3)
Chloride: 99 mmol/L (ref 98–111)
Creatinine, Ser: 0.83 mg/dL (ref 0.44–1.00)
GFR, Estimated: >60 mL/min (ref >60)
Glucose, Bld: 113 mg/dL — ABNORMAL HIGH (ref 70–99)
Potassium: 4.6 mmol/L (ref 3.5–5.1)
Sodium: 134 mmol/L — ABNORMAL LOW (ref 135–145)

## 2021-01-23 NOTE — Progress Notes (Signed)
Patient ID: Elizabeth Tucker, female   DOB: 1946/04/13, 75 y.o.   MRN: [id number] The patient is at her baseline mental status.  She is pleasantly demented.  She keeps removing her left hip dressing but her left hip incision looks good.  I placed a new dressing today.  The transitional care team is working on skilled nursing placement.

## 2021-01-23 NOTE — Progress Notes (Signed)
[facility]                                                                              Patient Demographics  Elizabeth Tucker, is a 75 y.o. female, DOB - 15-Jan-1946, KTG:[id number]  Admit date - 01/19/2021   Admitting Physician Elizabeth Blue, MD  Outpatient Primary MD for the patient is Ngetich, Donalee Citrin, NP  Outpatient specialists:   Presented from: ALF  LOS - 4  days   Medical records reviewed and are as summarized below:    Chief Complaint  Patient presents with  . Fall       Brief summary   Patient is a 75 year old female with history of dementia, Alzheimer's disease, GERD, prior history of hepatitis C, presented with unwitnessed fall.  Patient lives in a assisted living facility.  Per EMS report, she fell onto tile.  Subsequently complained of pain in her left hip. X-rays showed displaced femoral neck fracture on the left side, orthopedics was consulted.  Assessment & Plan    Principal Problem: Unwitnessed mechanical fall with closed left hip fracture, initial encounter [facility]) - Orthopedics consulted, underwent left total hip arthroplasty 6/1 overnight -Continue aspirin 81 mg twice daily for DVT prophylaxis, WBAT on the left hip  -PT eval recommended SNF, awaiting bed  -Pain controlled, continue bowel regimen -Hemoglobin stable   Active Problems: Fevers -Spiked a temp on 6/2, has remained afebrile since -UA 5/31 had shown no UTI, chest x-ray on admission showed no pneumonia.  -No infectious signs, no leukocytosis.  Repeat chest x-ray clear -Unclear etiology, no fever since then     Dementia with behavioral disturbance (HCC) -Continue Geodon 40 mg daily, trazodone 50 mg at bedtime, Ativan 1 mg every 6 hours as needed, 0.5 mg twice daily -Haldol as needed for agitation -Currently stable, no acute issues.    Class 2 obesity due to excess calories with body mass index (BMI) of 36.0 to 36.9 in adult Estimated body mass index is 36.94 kg/m as  calculated from the following:   Height as of this encounter: 5\' 5"  (1.651 m).   Weight as of this encounter: 100.7 kg.  Code Status: Full CODE STATUS DVT Prophylaxis:  SCDs Start: 01/20/21 0141 Place TED hose Start: 01/20/21 0141 SCDs Start: 01/19/21 0958   Level of Care: Level of care: Med-Surg Family Communication: Discussed all imaging results, lab results, explained to the patient's close friend, Elizabeth Tucker on 6/1.    Disposition Plan:     Status is: Inpatient Remains inpatient appropriate because:Inpatient level of care appropriate due to severity of illness   Dispo: The patient is from: ALF              Anticipated d/c is to: SNF              Patient currently is not medically stable to d/c.  Awaiting SNF   difficult to place patient No  Time Spent in minutes: 25 minutes  Procedures:  Left total hip arthroplasty 6/1  Consultants:   Orthopedics  Antimicrobials:   Anti-infectives (From admission, onward)   Start     Dose/Rate Route Frequency  Ordered Stop   01/20/21 0600  ceFAZolin (ANCEF) IVPB 2g/100 mL premix        2 g 200 mL/hr over 30 Minutes Intravenous On call to O.R. 01/19/21 1740 01/19/21 2126   01/20/21 0300  ceFAZolin (ANCEF) IVPB 2g/100 mL premix        2 g 200 mL/hr over 30 Minutes Intravenous Every 6 hours 01/20/21 0140 01/20/21 1023         Medications  Scheduled Meds: . aspirin  81 mg Oral BID PC  . docusate sodium  100 mg Oral BID  . feeding supplement  237 mL Oral BID BM  . LORazepam  0.5 mg Oral BID  . multivitamin with minerals  1 tablet Oral Daily  . pantoprazole  40 mg Oral Daily  . traZODone  50 mg Oral QHS  . ziprasidone  40 mg Oral Daily   Continuous Infusions: . sodium chloride    . methocarbamol (ROBAXIN) IV     PRN Meds:.acetaminophen, acetaminophen, bisacodyl, haloperidol, HYDROcodone-acetaminophen, HYDROcodone-acetaminophen, HYDROcodone-acetaminophen, LORazepam, menthol-cetylpyridinium **OR** phenol, methocarbamol  **OR** methocarbamol (ROBAXIN) IV, metoCLOPramide **OR** metoCLOPramide (REGLAN) injection, morphine injection, morphine injection, ondansetron **OR** ondansetron (ZOFRAN) IV, polyethylene glycol      Subjective:   Elizabeth Tucker was seen and examined today.  Pleasant, watching TV and laughing.  No acute issues.  Alert and oriented to self, dementia.  Pain controlled no acute issues overnight.    Objective:   Vitals:   01/22/21 1610 01/22/21 2205 01/23/21 0419 01/23/21 0819  BP: 100/72 106/70 109/70 120/71  Pulse: 93 85 88 99  Resp: Temp: 98.4 F (36.9 C) 98.2 F (36.8 C) 98 F (36.7 C) 98 F (36.7 C)  TempSrc: Oral Oral Oral Oral  SpO2: 96% 97% 98% 97%  Weight:      Height:        Intake/Output Summary (Last 24 hours) at 01/23/2021 1231 Last data filed at 01/23/2021 0757 Gross per 24 hour  Intake 360 ml  Output 325 ml  Net 35 ml     Wt Readings from Last 3 Encounters:  01/19/21 100.7 kg  11/27/20 82.3 kg  05/26/20 85.8 kg   Physical Exam  General: Alert and oriented x self, dementia  Cardiovascular: S1 S2 clear, RRR. No pedal edema b/l  Respiratory: CTAB, no wheezing, rales or rhonchi  Gastrointestinal: Soft, nontender, nondistended, NBS  Ext: no pedal edema bilaterally  Neuro: no new deficits  Psych: dementia   Data Reviewed:  I have personally reviewed following labs and imaging studies  Micro Results Recent Results (from the past 240 hour(s))  Resp Panel by RT-PCR (Flu A&B, Covid) Nasopharyngeal Swab     Status: None   Collection Time: 01/19/21  7:38 AM   Specimen: Nasopharyngeal Swab; Nasopharyngeal(NP) swabs in vial transport medium  Result Value Ref Range Status   SARS Coronavirus 2 by RT PCR NEGATIVE NEGATIVE Final    Comment: (NOTE) SARS-CoV-2 target nucleic acids are NOT DETECTED.  The SARS-CoV-2 RNA is generally detectable in upper respiratory specimens during the acute phase of infection. The lowest concentration of  SARS-CoV-2 viral copies this assay can detect is 138 copies/mL. A negative result does not preclude SARS-Cov-2 infection and should not be used as the sole basis for treatment or other patient management decisions. A negative result may occur with  improper specimen collection/handling, submission of specimen other than nasopharyngeal swab, presence of viral mutation(s) within the areas targeted by this assay, and inadequate number of viral  copies(<138 copies/mL). A negative result must be combined with clinical observations, patient history, and epidemiological information. The expected result is Negative.  Fact Sheet for Patients:  BloggerCourse.com  Fact Sheet for Healthcare Providers:  SeriousBroker.it  This test is no t yet approved or cleared by the [place] FDA and  has been authorized for detection and/or diagnosis of SARS-CoV-2 by FDA under an Emergency Use Authorization (EUA). This EUA will remain  in effect (meaning this test can be used) for the duration of the COVID-19 declaration under Section 564(b)(1) of the Act, 21 U.S.C.section 360bbb-3(b)(1), unless the authorization is terminated  or revoked sooner.       Influenza A by PCR NEGATIVE NEGATIVE Final   Influenza B by PCR NEGATIVE NEGATIVE Final    Comment: (NOTE) The Xpert Xpress SARS-CoV-2/FLU/RSV plus assay is intended as an aid in the diagnosis of influenza from Nasopharyngeal swab specimens and should not be used as a sole basis for treatment. Nasal washings and aspirates are unacceptable for Xpert Xpress SARS-CoV-2/FLU/RSV testing.  Fact Sheet for Patients: BloggerCourse.com  Fact Sheet for Healthcare Providers: SeriousBroker.it  This test is not yet approved or cleared by the [place] FDA and has been authorized for detection and/or diagnosis of SARS-CoV-2 by FDA under an Emergency Use  Authorization (EUA). This EUA will remain in effect (meaning this test can be used) for the duration of the COVID-19 declaration under Section 564(b)(1) of the Act, 21 U.S.C. section 360bbb-3(b)(1), unless the authorization is terminated or revoked.  Performed at [facility], [address] [place], [place] [address]   Surgical pcr screen     Status: None   Collection Time: 01/19/21  6:21 PM   Specimen: Nasal Mucosa; Nasal Swab  Result Value Ref Range Status   MRSA, PCR NEGATIVE NEGATIVE Final   Staphylococcus aureus NEGATIVE NEGATIVE Final    Comment: (NOTE) The Xpert SA Assay (FDA approved for NASAL specimens in patients 20 years of age and older), is one component of a comprehensive surveillance program. It is not intended to diagnose infection nor to guide or monitor treatment. Performed at [facility], [address] [place], [place] [address]   Culture, blood (Routine X 2) w Reflex to ID Panel     Status: None (Preliminary result)   Collection Time: Tucker  1:34 PM   Specimen: BLOOD LEFT ARM  Result Value Ref Range Status   Specimen Description BLOOD LEFT ARM  Final   Special Requests   Final    BOTTLES DRAWN AEROBIC AND ANAEROBIC Blood Culture adequate volume   Culture   Final    NO GROWTH < 24 HOURS Performed at [facility], [address], [place], [place] [address]    Report Status PENDING  Incomplete  Culture, blood (Routine X 2) w Reflex to ID Panel     Status: None (Preliminary result)   Collection Time: Tucker  1:42 PM   Specimen: BLOOD LEFT ARM  Result Value Ref Range Status   Specimen Description BLOOD LEFT ARM  Final   Special Requests   Final    BOTTLES DRAWN AEROBIC ONLY Blood Culture results may not be optimal due to an inadequate volume of blood received in culture bottles   Culture   Final    NO GROWTH < 24 HOURS Performed at [facility], [address], [place], [place] [address]    Report Status PENDING   Incomplete    Radiology Reports DG  Chest 1 View  Result Date: 01/19/2021 CLINICAL DATA:  Preoperative exam.  Fall. EXAM: CHEST  1 VIEW COMPARISON:  No recent prior. FINDINGS: The heart size and mediastinal contours are within normal limits. Both lungs are clear. No acute bony abnormality. Degenerative changes scoliosis thoracic spine. IMPRESSION: No active disease. Electronically Signed   By: Maisie Fushomas  Register   On: 01/19/2021 07:36   CT HEAD WO CONTRAST  Result Date: 01/19/2021 CLINICAL DATA:  75 year old female with history of trauma from a fall with injury to the head. EXAM: CT HEAD WITHOUT CONTRAST CT CERVICAL SPINE WITHOUT CONTRAST TECHNIQUE: Multidetector CT imaging of the head and cervical spine was performed following the standard protocol without intravenous contrast. Multiplanar CT image reconstructions of the cervical spine were also generated. COMPARISON:  No priors. FINDINGS: CT HEAD FINDINGS Brain: Very mild cerebral atrophy. Patchy areas of mild decreased attenuation are noted throughout the periventricular white matter of the cerebral hemispheres bilaterally, compatible with chronic microvascular ischemic disease. No evidence of acute infarction, hemorrhage, hydrocephalus, extra-axial collection or mass lesion/mass effect. Vascular: No hyperdense vessel or unexpected calcification. Skull: Normal. Negative for fracture or focal lesion. Sinuses/Orbits: No acute finding. Other: None. CT CERVICAL SPINE FINDINGS Alignment: Normal. Skull base and vertebrae: No acute fracture. No primary bone lesion or focal pathologic process. Soft tissues and spinal canal: No prevertebral fluid or swelling. No visible canal hematoma. Disc levels: Multilevel degenerative disc disease, most pronounced at C6-C7 and C7-T1. Mild multilevel facet arthropathy. Upper chest: Unremarkable. Other: None. IMPRESSION: 1. No evidence of significant acute traumatic injury to the skull, brain or cervical spine. 2. Very mild  cerebral atrophy and chronic microvascular ischemic changes in the periventricular white matter of the cerebral hemispheres. 3. Multilevel degenerative disc disease and cervical spondylosis, as above. Electronically Signed   By: Trudie Reedaniel  Entrikin M.D.   On: 01/19/2021 07:48   CT CERVICAL SPINE WO CONTRAST  Result Date: 01/19/2021 CLINICAL DATA:  75 year old female with history of trauma from a fall with injury to the head. EXAM: CT HEAD WITHOUT CONTRAST CT CERVICAL SPINE WITHOUT CONTRAST TECHNIQUE: Multidetector CT imaging of the head and cervical spine was performed following the standard protocol without intravenous contrast. Multiplanar CT image reconstructions of the cervical spine were also generated. COMPARISON:  No priors. FINDINGS: CT HEAD FINDINGS Brain: Very mild cerebral atrophy. Patchy areas of mild decreased attenuation are noted throughout the periventricular white matter of the cerebral hemispheres bilaterally, compatible with chronic microvascular ischemic disease. No evidence of acute infarction, hemorrhage, hydrocephalus, extra-axial collection or mass lesion/mass effect. Vascular: No hyperdense vessel or unexpected calcification. Skull: Normal. Negative for fracture or focal lesion. Sinuses/Orbits: No acute finding. Other: None. CT CERVICAL SPINE FINDINGS Alignment: Normal. Skull base and vertebrae: No acute fracture. No primary bone lesion or focal pathologic process. Soft tissues and spinal canal: No prevertebral fluid or swelling. No visible canal hematoma. Disc levels: Multilevel degenerative disc disease, most pronounced at C6-C7 and C7-T1. Mild multilevel facet arthropathy. Upper chest: Unremarkable. Other: None. IMPRESSION: 1. No evidence of significant acute traumatic injury to the skull, brain or cervical spine. 2. Very mild cerebral atrophy and chronic microvascular ischemic changes in the periventricular white matter of the cerebral hemispheres. 3. Multilevel degenerative disc  disease and cervical spondylosis, as above. Electronically Signed   By: Trudie Reedaniel  Entrikin M.D.   On: 01/19/2021 07:48   Pelvis Portable  Result Date: 01/19/2021 CLINICAL DATA:  Postop left hip EXAM: PORTABLE PELVIS 1-2 VIEWS COMPARISON:  01/19/2021 FINDINGS: Interval left hip  replacement with intact hardware and normal alignment. Gas in the soft tissues consistent with recent surgery. IMPRESSION: Interval left hip replacement with expected postsurgical change Electronically Signed   By: Jasmine Pang M.D.   On: 01/19/2021 23:32   DG Chest Port 1 View  Result Date: 01/21/2021 CLINICAL DATA:  Fever EXAM: PORTABLE CHEST 1 VIEW COMPARISON:  Portable exam 1411 hours compared to 01/19/2021 FINDINGS: Upper normal heart size. Mediastinal contours and pulmonary vascularity normal. Atherosclerotic calcification aorta. Minimal bibasilar atelectasis. Lungs otherwise clear. No infiltrate, pleural effusion, or pneumothorax. Bones demineralized. IMPRESSION: Minimal bibasilar atelectasis. Aortic Atherosclerosis (ICD10-I70.0). Electronically Signed   By: Ulyses Southward M.D.   On: 01/21/2021 14:26   DG C-Arm 1-60 Min  Result Date: 01/19/2021 CLINICAL DATA:  Anterior left hip arthroplasty EXAM: DG C-ARM 1-60 MIN; DG HIP (WITH OR WITHOUT PELVIS) 2-3V LEFT FLUOROSCOPY TIME:  Fluoroscopy Time:  33 seconds Radiation Exposure Index (if provided by the fluoroscopic device): 1.69 mGy Number of Acquired Spot Images: 3 COMPARISON:  01/19/2021 FINDINGS: Three fluoroscopic images are obtained during the performance of the procedure and are provided for interpretation only. A left hip arthroplasty is identified in the expected position without signs of complication. IMPRESSION: 1. Unremarkable left hip arthroplasty. Please refer to the operative report. Electronically Signed   By: Sharlet Salina M.D.   On: 01/19/2021 22:14   DG HIP UNILAT WITH PELVIS 2-3 VIEWS LEFT  Result Date: 01/19/2021 CLINICAL DATA:  Anterior left hip arthroplasty  EXAM: DG C-ARM 1-60 MIN; DG HIP (WITH OR WITHOUT PELVIS) 2-3V LEFT FLUOROSCOPY TIME:  Fluoroscopy Time:  33 seconds Radiation Exposure Index (if provided by the fluoroscopic device): 1.69 mGy Number of Acquired Spot Images: 3 COMPARISON:  01/19/2021 FINDINGS: Three fluoroscopic images are obtained during the performance of the procedure and are provided for interpretation only. A left hip arthroplasty is identified in the expected position without signs of complication. IMPRESSION: 1. Unremarkable left hip arthroplasty. Please refer to the operative report. Electronically Signed   By: Sharlet Salina M.D.   On: 01/19/2021 22:14   DG Hip Unilat With Pelvis 2-3 Views Left  Result Date: 01/19/2021 CLINICAL DATA:  75 year old female with left side hip pain and rotation. Possible fracture. Preoperative exam. EXAM: DG HIP (WITH OR WITHOUT PELVIS) 2-3V LEFT COMPARISON:  None. FINDINGS: Left femoral head is normally located. But a left femoral neck fracture is visible on image 3 with mild impaction. The left intertrochanteric segment and proximal left femoral shaft remain intact. No pelvis fracture. SI joints appear normal. Grossly intact proximal right femur. Negative visible bowel gas pattern.  Incidental pelvic phleboliths. IMPRESSION: Positive for left femoral neck fracture with mild impaction. Electronically Signed   By: Odessa Fleming M.D.   On: 01/19/2021 07:43    Lab Data:  CBC: Recent Labs  Lab 01/19/21 0702 01/20/21 0232 Tucker 1334 01/23/21 0211  WBC 5.1 8.0 4.7 3.6*  NEUTROABS 4.1  --   --   --   HGB 14.2 13.3 11.8* 10.3*  HCT 41.8 38.6 35.0* 30.4*  MCV 89.1 88.3 90.0 89.4  PLT PLATELET CLUMPS NOTED ON SMEAR, UNABLE TO ESTIMATE 104* 97* 105*   Basic Metabolic Panel: Recent Labs  Lab 01/19/21 0702 01/20/21 0232 01/23/21 0211  NA 135 135 134*  K 3.8 4.0 4.6  CL 103 101 99  CO2 24 23 29   GLUCOSE 122* 122* 113*  BUN 8 10 19   CREATININE 0.75 0.71 0.83  CALCIUM 9.2 8.7* 8.6*  GFR: Estimated Creatinine Clearance: 72.1 mL/min (by C-G formula based on SCr of 0.83 mg/dL). Liver Function Tests: No results for input(s): AST, ALT, ALKPHOS, BILITOT, PROT, ALBUMIN in the last 168 hours. No results for input(s): LIPASE, AMYLASE in the last 168 hours. No results for input(s): AMMONIA in the last 168 hours. Coagulation Profile: Recent Labs  Lab 01/19/21 0702  INR 1.1   Cardiac Enzymes: No results for input(s): CKTOTAL, CKMB, CKMBINDEX, TROPONINI in the last 168 hours. BNP (last 3 results) No results for input(s): PROBNP in the last 8760 hours. HbA1C: No results for input(s): HGBA1C in the last 72 hours. CBG: Recent Labs  Lab 01/20/21 1139  GLUCAP 136*   Lipid Profile: No results for input(s): CHOL, HDL, LDLCALC, TRIG, CHOLHDL, LDLDIRECT in the last 72 hours. Thyroid Function Tests: No results for input(s): TSH, T4TOTAL, FREET4, T3FREE, THYROIDAB in the last 72 hours. Anemia Panel: No results for input(s): VITAMINB12, FOLATE, FERRITIN, TIBC, IRON, RETICCTPCT in the last 72 hours. Urine analysis:    Component Value Date/Time   COLORURINE YELLOW 01/19/2021 1001   APPEARANCEUR CLEAR 01/19/2021 1001   LABSPEC 1.013 01/19/2021 1001   PHURINE 6.0 01/19/2021 1001   GLUCOSEU NEGATIVE 01/19/2021 1001   HGBUR NEGATIVE 01/19/2021 1001   HGBUR negative 02/01/2008 1038   BILIRUBINUR NEGATIVE 01/19/2021 1001   KETONESUR NEGATIVE 01/19/2021 1001   PROTEINUR NEGATIVE 01/19/2021 1001   UROBILINOGEN 1.0 08/14/2008 0851   NITRITE NEGATIVE 01/19/2021 1001   LEUKOCYTESUR TRACE (A) 01/19/2021 1001     Amire Gossen M.D. [facility] 01/23/2021, 12:31 PM  Available via Epic secure chat 7am-7pm After 7 pm, please refer to night coverage provider listed on amion.

## 2021-01-23 NOTE — TOC Progression Note (Signed)
Transition of Care [facility]) - Progression Note    Patient Details  Name: Elizabeth Tucker MRN: [id number] Date of Birth: 1945/08/31  Transition of Care [facility]) CM/SW Contact  Terrial Rhodes, LCSWA Phone Number: 01/23/2021, 11:03 AM  Clinical Narrative:     CSW spoke with [place] who confirmed with [facility] that patient has received both Covid Vaccines as well as booster. CSW called Olegario Messier with [facility] to let her know that patient has been vaccinated. Olegario Messier confirmed that she can offer a SNF bed for patient. Olegario Messier confirmed she has started insurance authorization for patient. Patient has SNF bed at at [facility] authorization is pending. CSW will continue to  Follow and assist with discharge planning needs.    Expected Discharge Plan: Skilled Nursing Facility Barriers to Discharge: Continued Medical Work up,Insurance Authorization,SNF Pending bed offer  Expected Discharge Plan and Services Expected Discharge Plan: Skilled Nursing Facility       Living arrangements for the past 2 months: Assisted Living Facility                                       Social Determinants of Health (SDOH) Interventions    Readmission Risk Interventions No flowsheet data found.

## 2021-01-24 ENCOUNTER — Other Ambulatory Visit: Payer: Self-pay

## 2021-01-24 ENCOUNTER — Inpatient Hospital Stay (HOSPITAL_COMMUNITY): Payer: Medicare (Managed Care)

## 2021-01-24 LAB — RESP PANEL BY RT-PCR (FLU A&B, COVID) ARPGX2
Influenza A by PCR: NEGATIVE
Influenza B by PCR: NEGATIVE
SARS Coronavirus 2 by RT PCR: NEGATIVE

## 2021-01-24 NOTE — Plan of Care (Signed)
  Problem: Activity: Goal: Ability to ambulate and perform ADLs will improve Outcome: Progressing   Problem: Pain Management: Goal: Pain level will decrease Outcome: Progressing   

## 2021-01-24 NOTE — Progress Notes (Signed)
[facility]                                                                              Patient Demographics  Elizabeth Tucker, is a 75 y.o. female, DOB - Oct 27, 1945, ZOX:[id number]  Admit date - 01/19/2021   Admitting Physician Jonah Blue, MD  Outpatient Primary MD for the patient is Ngetich, Donalee Citrin, NP  Outpatient specialists:   Presented from: ALF  LOS - 5  days   Medical records reviewed and are as summarized below:    Chief Complaint  Patient presents with  . Fall       Brief summary   Patient is a 75 year old female with history of dementia, Alzheimer's disease, GERD, prior history of hepatitis C, presented with unwitnessed fall.  Patient lives in a assisted living facility.  Per EMS report, she fell onto tile.  Subsequently complained of pain in her left hip. X-rays showed displaced femoral neck fracture on the left side, orthopedics was consulted.  Assessment & Plan    Principal Problem: Unwitnessed mechanical fall with closed left hip fracture, initial encounter [facility]) - Orthopedics consulted, underwent left total hip arthroplasty 6/1 overnight -Continue aspirin 81 mg twice daily for DVT prophylaxis, WBAT on the left hip  -PT evaluation recommended skilled nursing facility, awaiting bed placement   Active Problems: Fevers -Spiked a temp on 6/2, has remained afebrile since -UA 5/31 had shown no UTI, chest x-ray on admission showed no pneumonia.  -No infectious signs, no leukocytosis.  Repeat chest x-ray clear -Unclear etiology, no fever since then     Dementia with behavioral disturbance (HCC) -Continue Geodon 40 mg daily, trazodone 50 mg at bedtime, Ativan 1 mg every 6 hours as needed, 0.5 mg twice daily -Haldol as needed for agitation -Currently stable, no acute issues.  Fall -Overnight had a unwitnessed fall, currently not complaining of any pain.  - Underwent bilateral shoulder x-ray no fracture or dislocation, osteoarthritis     Class 2 obesity due to excess calories with body mass index (BMI) of 36.0 to 36.9 in adult Estimated body mass index is 36.94 kg/m as calculated from the following:   Height as of this encounter:  (1.651 m).   Weight as of this encounter: 100.7 kg.  Code Status: Full CODE STATUS DVT Prophylaxis:  SCDs Start: 01/20/21 0141 Place TED hose Start: 01/20/21 0141 SCDs Start: 01/19/21 0958   Level of Care: Level of care: Med-Surg Family Communication: Discussed all imaging results, lab results, explained to the patient's close friend, Ms Scheryl Darter on 6/1.     Disposition Plan:     Status is: Inpatient Remains inpatient appropriate because:Inpatient level of care appropriate due to severity of illness   Dispo: The patient is from: ALF              Anticipated d/c is to: SNF              Patient currently is not medically stable to d/c.  Awaiting skilled nursing facility, possible DC in a.m.   difficult to place patient No  Time Spent in minutes: 15 minutes  Procedures:  Left total  hip arthroplasty 6/1  Consultants:   Orthopedics  Antimicrobials:   Anti-infectives (From admission, onward)   Start     Dose/Rate Route Frequency Ordered Stop   01/20/21 0600  ceFAZolin (ANCEF) IVPB 2g/100 mL premix        2 g 200 mL/hr over 30 Minutes Intravenous On call to O.R. 01/19/21 1740 01/19/21 2126   01/20/21 0300  ceFAZolin (ANCEF) IVPB 2g/100 mL premix        2 g 200 mL/hr over 30 Minutes Intravenous Every 6 hours 01/20/21 0140 01/20/21 1023         Medications  Scheduled Meds: . aspirin  81 mg Oral BID PC  . docusate sodium  100 mg Oral BID  . feeding supplement  237 mL Oral BID BM  . LORazepam  0.5 mg Oral BID  . multivitamin with minerals  1 tablet Oral Daily  . pantoprazole  40 mg Oral Daily  . traZODone  50 mg Oral QHS  . ziprasidone  40 mg Oral Daily   Continuous Infusions: . sodium chloride    . methocarbamol (ROBAXIN) IV     PRN Meds:.acetaminophen,  acetaminophen, bisacodyl, haloperidol, HYDROcodone-acetaminophen, HYDROcodone-acetaminophen, HYDROcodone-acetaminophen, LORazepam, menthol-cetylpyridinium **OR** phenol, methocarbamol **OR** methocarbamol (ROBAXIN) IV, metoCLOPramide **OR** metoCLOPramide (REGLAN) injection, morphine injection, morphine injection, ondansetron **OR** ondansetron (ZOFRAN) IV, polyethylene glycol      Subjective:   Elizabeth Tucker was seen and examined today.  Currently no acute complaints.  Overnight had an unwitnessed fall on the soft mat.  No pain currently on examination.  Has dementia.  No fevers. Objective:   Vitals:   01/24/21 0523 01/24/21 0554 01/24/21 0653 01/24/21 0720  BP: 120/76 110/76 (!) 111/54 111/73  Pulse: 85 78  84  Resp: 19 19 17 14   Temp: 97.8 F (36.6 C) 98.5 F (36.9 C) 98.4 F (36.9 C) 98.1 F (36.7 C)  TempSrc: Oral Oral Oral Oral  SpO2: 97% 97% 96% 99%  Weight:      Height:       No intake or output data in the 24 hours ending 01/24/21 1233   Wt Readings from Last 3 Encounters:  01/19/21 100.7 kg  11/27/20 82.3 kg  05/26/20 85.8 kg   Physical Exam  General: Alert and oriented to self  Cardiovascular: S1 S2 clear, RRR. No pedal edema b/l  Respiratory: CTAB, no wheezing  Gastrointestinal: Soft, nontender, nondistended, NBS  Ext: no decrease in range of movement of the shoulders bilaterally, no edema  Neuro: no new deficits  Skin: No rashes  Psych: dementia   Data Reviewed:  I have personally reviewed following labs and imaging studies  Micro Results Recent Results (from the past 240 hour(s))  Resp Panel by RT-PCR (Flu A&B, Covid) Nasopharyngeal Swab     Status: None   Collection Time: 01/19/21  7:38 AM   Specimen: Nasopharyngeal Swab; Nasopharyngeal(NP) swabs in vial transport medium  Result Value Ref Range Status   SARS Coronavirus 2 by RT PCR NEGATIVE NEGATIVE Final    Comment: (NOTE) SARS-CoV-2 target nucleic acids are NOT DETECTED.  The  SARS-CoV-2 RNA is generally detectable in upper respiratory specimens during the acute phase of infection. The lowest concentration of SARS-CoV-2 viral copies this assay can detect is 138 copies/mL. A negative result does not preclude SARS-Cov-2 infection and should not be used as the sole basis for treatment or other patient management decisions. A negative result may occur with  improper specimen collection/handling, submission of specimen other than nasopharyngeal swab, presence of  viral mutation(s) within the areas targeted by this assay, and inadequate number of viral copies(<138 copies/mL). A negative result must be combined with clinical observations, patient history, and epidemiological information. The expected result is Negative.  Fact Sheet for Patients:  BloggerCourse.com  Fact Sheet for Healthcare Providers:  SeriousBroker.it  This test is no t yet approved or cleared by the [place] FDA and  has been authorized for detection and/or diagnosis of SARS-CoV-2 by FDA under an Emergency Use Authorization (EUA). This EUA will remain  in effect (meaning this test can be used) for the duration of the COVID-19 declaration under Section 564(b)(1) of the Act, 21 U.S.C.section 360bbb-3(b)(1), unless the authorization is terminated  or revoked sooner.       Influenza A by PCR NEGATIVE NEGATIVE Final   Influenza B by PCR NEGATIVE NEGATIVE Final    Comment: (NOTE) The Xpert Xpress SARS-CoV-2/FLU/RSV plus assay is intended as an aid in the diagnosis of influenza from Nasopharyngeal swab specimens and should not be used as a sole basis for treatment. Nasal washings and aspirates are unacceptable for Xpert Xpress SARS-CoV-2/FLU/RSV testing.  Fact Sheet for Patients: BloggerCourse.com  Fact Sheet for Healthcare Providers: SeriousBroker.it  This test is not yet approved or  cleared by the [place] FDA and has been authorized for detection and/or diagnosis of SARS-CoV-2 by FDA under an Emergency Use Authorization (EUA). This EUA will remain in effect (meaning this test can be used) for the duration of the COVID-19 declaration under Section 564(b)(1) of the Act, 21 U.S.C. section 360bbb-3(b)(1), unless the authorization is terminated or revoked.  Performed at [facility], [address], [place], [place] [address]   Surgical pcr screen     Status: None   Collection Time: 01/19/21  6:21 PM   Specimen: Nasal Mucosa; Nasal Swab  Result Value Ref Range Status   MRSA, PCR NEGATIVE NEGATIVE Final   Staphylococcus aureus NEGATIVE NEGATIVE Final    Comment: (NOTE) The Xpert SA Assay (FDA approved for NASAL specimens in patients 16 years of age and older), is one component of a comprehensive surveillance program. It is not intended to diagnose infection nor to guide or monitor treatment. Performed at [facility], [address] [place], [place] [address]   Culture, blood (Routine X 2) w Reflex to ID Panel     Status: None (Preliminary result)   Collection Time: 01/21/21  1:34 PM   Specimen: BLOOD LEFT ARM  Result Value Ref Range Status   Specimen Description BLOOD LEFT ARM  Final   Special Requests   Final    BOTTLES DRAWN AEROBIC AND ANAEROBIC Blood Culture adequate volume   Culture   Final    NO GROWTH 2 DAYS Performed at [facility], [address], [place], [place] [address]    Report Status PENDING  Incomplete  Culture, blood (Routine X 2) w Reflex to ID Panel     Status: None (Preliminary result)   Collection Time: 01/21/21  1:42 PM   Specimen: BLOOD LEFT ARM  Result Value Ref Range Status   Specimen Description BLOOD LEFT ARM  Final   Special Requests   Final    BOTTLES DRAWN AEROBIC ONLY Blood Culture results may not be optimal due to an inadequate volume of blood received in culture bottles   Culture   Final    NO  GROWTH 2 DAYS Performed at [facility], [address], [place], [place] [address]  Report Status PENDING  Incomplete    Radiology Reports DG Chest 1 View  Result Date: 01/19/2021 CLINICAL DATA:  Preoperative exam.  Fall. EXAM: CHEST  1 VIEW COMPARISON:  No recent prior. FINDINGS: The heart size and mediastinal contours are within normal limits. Both lungs are clear. No acute bony abnormality. Degenerative changes scoliosis thoracic spine. IMPRESSION: No active disease. Electronically Signed   By: Maisie Fus  Register   On: 01/19/2021 07:36   CT HEAD WO CONTRAST  Result Date: 01/19/2021 CLINICAL DATA:  75 year old female with history of trauma from a fall with injury to the head. EXAM: CT HEAD WITHOUT CONTRAST CT CERVICAL SPINE WITHOUT CONTRAST TECHNIQUE: Multidetector CT imaging of the head and cervical spine was performed following the standard protocol without intravenous contrast. Multiplanar CT image reconstructions of the cervical spine were also generated. COMPARISON:  No priors. FINDINGS: CT HEAD FINDINGS Brain: Very mild cerebral atrophy. Patchy areas of mild decreased attenuation are noted throughout the periventricular white matter of the cerebral hemispheres bilaterally, compatible with chronic microvascular ischemic disease. No evidence of acute infarction, hemorrhage, hydrocephalus, extra-axial collection or mass lesion/mass effect. Vascular: No hyperdense vessel or unexpected calcification. Skull: Normal. Negative for fracture or focal lesion. Sinuses/Orbits: No acute finding. Other: None. CT CERVICAL SPINE FINDINGS Alignment: Normal. Skull base and vertebrae: No acute fracture. No primary bone lesion or focal pathologic process. Soft tissues and spinal canal: No prevertebral fluid or swelling. No visible canal hematoma. Disc levels: Multilevel degenerative disc disease, most pronounced at C6-C7 and C7-T1. Mild multilevel facet arthropathy. Upper chest: Unremarkable. Other: None.  IMPRESSION: 1. No evidence of significant acute traumatic injury to the skull, brain or cervical spine. 2. Very mild cerebral atrophy and chronic microvascular ischemic changes in the periventricular white matter of the cerebral hemispheres. 3. Multilevel degenerative disc disease and cervical spondylosis, as above. Electronically Signed   By: Trudie Reed M.D.   On: 01/19/2021 07:48   CT CERVICAL SPINE WO CONTRAST  Result Date: 01/19/2021 CLINICAL DATA:  75 year old female with history of trauma from a fall with injury to the head. EXAM: CT HEAD WITHOUT CONTRAST CT CERVICAL SPINE WITHOUT CONTRAST TECHNIQUE: Multidetector CT imaging of the head and cervical spine was performed following the standard protocol without intravenous contrast. Multiplanar CT image reconstructions of the cervical spine were also generated. COMPARISON:  No priors. FINDINGS: CT HEAD FINDINGS Brain: Very mild cerebral atrophy. Patchy areas of mild decreased attenuation are noted throughout the periventricular white matter of the cerebral hemispheres bilaterally, compatible with chronic microvascular ischemic disease. No evidence of acute infarction, hemorrhage, hydrocephalus, extra-axial collection or mass lesion/mass effect. Vascular: No hyperdense vessel or unexpected calcification. Skull: Normal. Negative for fracture or focal lesion. Sinuses/Orbits: No acute finding. Other: None. CT CERVICAL SPINE FINDINGS Alignment: Normal. Skull base and vertebrae: No acute fracture. No primary bone lesion or focal pathologic process. Soft tissues and spinal canal: No prevertebral fluid or swelling. No visible canal hematoma. Disc levels: Multilevel degenerative disc disease, most pronounced at C6-C7 and C7-T1. Mild multilevel facet arthropathy. Upper chest: Unremarkable. Other: None. IMPRESSION: 1. No evidence of significant acute traumatic injury to the skull, brain or cervical spine. 2. Very mild cerebral atrophy and chronic microvascular  ischemic changes in the periventricular white matter of the cerebral hemispheres. 3. Multilevel degenerative disc disease and cervical spondylosis, as above. Electronically Signed   By: Trudie Reed M.D.   On: 01/19/2021 07:48   Pelvis Portable  Result Date: 01/19/2021 CLINICAL DATA:  Postop left hip EXAM:  PORTABLE PELVIS 1-2 VIEWS COMPARISON:  01/19/2021 FINDINGS: Interval left hip replacement with intact hardware and normal alignment. Gas in the soft tissues consistent with recent surgery. IMPRESSION: Interval left hip replacement with expected postsurgical change Electronically Signed   By: Jasmine PangKim  Fujinaga M.D.   On: 01/19/2021 23:32   DG Chest Port 1 View  Result Date: 01/21/2021 CLINICAL DATA:  Fever EXAM: PORTABLE CHEST 1 VIEW COMPARISON:  Portable exam 1411 hours compared to 01/19/2021 FINDINGS: Upper normal heart size. Mediastinal contours and pulmonary vascularity normal. Atherosclerotic calcification aorta. Minimal bibasilar atelectasis. Lungs otherwise clear. No infiltrate, pleural effusion, or pneumothorax. Bones demineralized. IMPRESSION: Minimal bibasilar atelectasis. Aortic Atherosclerosis (ICD10-I70.0). Electronically Signed   By: Ulyses SouthwardMark  Boles M.D.   On: 01/21/2021 14:26   DG Shoulder Left Port  Result Date: 01/24/2021 CLINICAL DATA:  Pain.  Recent fall EXAM: LEFT SHOULDER: 3 V COMPARISON:  None. FINDINGS: Frontal, oblique, and Y scapular images obtained. No acute fracture or dislocation. There is moderate narrowing of the acromioclavicular joint with milder narrowing of the glenohumeral joint. No erosive change. There is bony overgrowth along the inferior acromioclavicular joint. Visualized left lung clear. 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 Bony overgrowth along the inferior acromioclavicular joint potentially places patient at increased risk for impingement syndrome. No acute fracture or dislocation. Electronically Signed   By: Bretta BangWilliam   Woodruff III M.D.   On: 01/24/2021 08:18   DG Shoulder Right Port  Result Date: 01/24/2021 CLINICAL DATA:  Pain following recent trauma EXAM: PORTABLE RIGHT SHOULDER: 3 V COMPARISON:  None. FINDINGS: Frontal, oblique, and Y scapular images were obtained. No fracture or dislocation. There is mild generalized joint space narrowing. No erosive change. Visualized right lung clear. IMPRESSION: Mild generalized osteoarthritic change.  No fracture or dislocation. Electronically Signed   By: Bretta BangWilliam  Woodruff III M.D.   On: 01/24/2021 08:19   DG C-Arm 1-60 Min  Result Date: 01/19/2021 CLINICAL DATA:  Anterior left hip arthroplasty EXAM: DG C-ARM 1-60 MIN; DG HIP (WITH OR WITHOUT PELVIS) 2-3V LEFT FLUOROSCOPY TIME:  Fluoroscopy Time:  33 seconds Radiation Exposure Index (if provided by the fluoroscopic device): 1.69 mGy Number of Acquired Spot Images: 3 COMPARISON:  01/19/2021 FINDINGS: Three fluoroscopic images are obtained during the performance of the procedure and are provided for interpretation only. A left hip arthroplasty is identified in the expected position without signs of complication. IMPRESSION: 1. Unremarkable left hip arthroplasty. Please refer to the operative report. Electronically Signed   By: Sharlet SalinaMichael  Brown M.D.   On: 01/19/2021 22:14   DG HIP UNILAT WITH PELVIS 2-3 VIEWS LEFT  Result Date: 01/19/2021 CLINICAL DATA:  Anterior left hip arthroplasty EXAM: DG C-ARM 1-60 MIN; DG HIP (WITH OR WITHOUT PELVIS) 2-3V LEFT FLUOROSCOPY TIME:  Fluoroscopy Time:  33 seconds Radiation Exposure Index (if provided by the fluoroscopic device): 1.69 mGy Number of Acquired Spot Images: 3 COMPARISON:  01/19/2021 FINDINGS: Three fluoroscopic images are obtained during the performance of the procedure and are provided for interpretation only. A left hip arthroplasty is identified in the expected position without signs of complication. IMPRESSION: 1. Unremarkable left hip arthroplasty. Please refer to the operative  report. Electronically Signed   By: Sharlet SalinaMichael  Brown M.D.   On: 01/19/2021 22:14   DG Hip Unilat With Pelvis 2-3 Views Left  Result Date: 01/19/2021 CLINICAL DATA:  75 year old female with left side hip pain and rotation. Possible fracture. Preoperative exam. EXAM: DG HIP (WITH OR WITHOUT PELVIS) 2-3V LEFT COMPARISON:  None. FINDINGS: Left femoral head is normally located. But a left femoral neck fracture is visible on image 3 with mild impaction. The left intertrochanteric segment and proximal left femoral shaft remain intact. No pelvis fracture. SI joints appear normal. Grossly intact proximal right femur. Negative visible bowel gas pattern.  Incidental pelvic phleboliths. IMPRESSION: Positive for left femoral neck fracture with mild impaction. Electronically Signed   By: Odessa Fleming M.D.   On: 01/19/2021 07:43    Lab Data:  CBC: Recent Labs  Lab 01/19/21 0702 01/20/21 0232 01/21/21 1334 01/23/21 0211  WBC 5.1 8.0 4.7 3.6*  NEUTROABS 4.1  --   --   --   HGB 14.2 13.3 11.8* 10.3*  HCT 41.8 38.6 35.0* 30.4*  MCV 89.1 88.3 90.0 89.4  PLT PLATELET CLUMPS NOTED ON SMEAR, UNABLE TO ESTIMATE 104* 97* 105*   Basic Metabolic Panel: Recent Labs  Lab 01/19/21 0702 01/20/21 0232 01/23/21 0211  NA 135 135 134*  K 3.8 4.0 4.6  CL 103 101 99  CO2 24 23 29   GLUCOSE 122* 122* 113*  BUN 8 10 19   CREATININE 0.75 0.71 0.83  CALCIUM 9.2 8.7* 8.6*   GFR: Estimated Creatinine Clearance: 72.1 mL/min (by C-G formula based on SCr of 0.83 mg/dL). Liver Function Tests: No results for input(s): AST, ALT, ALKPHOS, BILITOT, PROT, ALBUMIN in the last 168 hours. No results for input(s): LIPASE, AMYLASE in the last 168 hours. No results for input(s): AMMONIA in the last 168 hours. Coagulation Profile: Recent Labs  Lab 01/19/21 0702  INR 1.1   Cardiac Enzymes: No results for input(s): CKTOTAL, CKMB, CKMBINDEX, TROPONINI in the last 168 hours. BNP (last 3 results) No results for input(s): PROBNP in the  last 8760 hours. HbA1C: No results for input(s): HGBA1C in the last 72 hours. CBG: Recent Labs  Lab 01/20/21 1139  GLUCAP 136*   Lipid Profile: No results for input(s): CHOL, HDL, LDLCALC, TRIG, CHOLHDL, LDLDIRECT in the last 72 hours. Thyroid Function Tests: No results for input(s): TSH, T4TOTAL, FREET4, T3FREE, THYROIDAB in the last 72 hours. Anemia Panel: No results for input(s): VITAMINB12, FOLATE, FERRITIN, TIBC, IRON, RETICCTPCT in the last 72 hours. Urine analysis:    Component Value Date/Time   COLORURINE YELLOW 01/19/2021 1001   APPEARANCEUR CLEAR 01/19/2021 1001   LABSPEC 1.013 01/19/2021 1001   PHURINE 6.0 01/19/2021 1001   GLUCOSEU NEGATIVE 01/19/2021 1001   HGBUR NEGATIVE 01/19/2021 1001   HGBUR negative 02/01/2008 1038   BILIRUBINUR NEGATIVE 01/19/2021 1001   KETONESUR NEGATIVE 01/19/2021 1001   PROTEINUR NEGATIVE 01/19/2021 1001   UROBILINOGEN 1.0 08/14/2008 0851   NITRITE NEGATIVE 01/19/2021 1001   LEUKOCYTESUR TRACE (A) 01/19/2021 1001     Nakota Elsen M.D. [facility] 01/24/2021, 12:33 PM  Available via Epic secure chat 7am-7pm After 7 pm, please refer to night coverage provider listed on amion.

## 2021-01-24 NOTE — Progress Notes (Signed)
HOSPITAL MEDICINE OVERNIGHT EVENT NOTE    Notified by nursing that patient experienced an unwitnessed fall.  No suspected loss of consciousness.  To the fall the patient is exhibiting no change in mentation.  Patient is seemingly neurologically intact.  Patient does complain of bilateral shoulder pain however without obvious deformity.  Will order bilateral shoulder x-rays and follow-up on images.  Nursing to notify me if there is any other clinical change.  Marinda Elk  MD [facility]

## 2021-01-24 NOTE — Progress Notes (Signed)
At 0505, [name] RN informed me that Elizabeth Tucker had fallen out of bed. I was currently in room 20 at that time passing meds and getting vitals. Upon entering the room, the patient was found lying on her stomach, face down, on the floor on the left side of the bed. The bed alarm was going off and was set to the sitting alarm. Patient was wearing slip resistant socks. Patient has a history of Alzheimer's and dementia and during my shift she had attempted to exit the bed 3 different times prior. Patient said she was trying to go to the bathroom. I recently taken the patient to the bathroom 20 minutes ago prior to the fall. Patient had fallen out of the bed onto the floor. More staff was brought to the room and patient was placed back into the bed. Vitals, physical assessment, and neurological assessment was performed. Vital signs were normal. The patient stated that she did not hit her head during the fall, did not lose consciousness, and stated she landed on her shoulders. Patient complains of bilateral shoulder pain and pain to her left hip. Patient fell at a [facility] and her left hip was fractured on 01/19/21 and a total hip arthroplasty was performed on 01/19/21 and has had recurring pain since surgery. I gave her a Norco 5mg -325mg  tablet at 0229 for pain. The bilateral shoulder pain is new and occurred post fall. No bruising, bumps, cuts, bleeding, or open wounds were found. Both shoulders/arms had equal strength and ROM, no deformities. Patient appears to be neurologically intact per her baseline. Patient knows her name and DOB. Patient is also aware that she is at the [facility] and stated the hospital by name. Patient is also aware that she is in [place], but did not know what state she was in or what the month/year was. Dr. [facility] FNP and Dr. Dr. Waterford were contacted regarding the fall. Dr. Chinita Greenland placed orders for portable DG left and right shoulders. Results still pending. Post fall  huddle completed with charge nurse.

## 2021-01-24 NOTE — Progress Notes (Signed)
Patient stable.  Leg lengths approximately equal. Ankle dorsiflexion intact. Does not appear to be in pain. Patient does have dementia

## 2021-01-25 NOTE — TOC Progression Note (Addendum)
Transition of Care [facility]) - Progression Note    Patient Details  Name: Elizabeth Tucker MRN: [id number] Date of Birth: [birth year]  Transition of Care [facility]) CM/SW Contact  Ralene Bathe, LCSWA Phone Number: 01/25/2021, 9:56 AM  Clinical Narrative:    CSW spoke with Olegario Messier with [facility].  The insurance has been approved.   Pending bed availability due to the facility being near capacity.    10:15-  [facility] verified that they do not have a bed available today.  "Maybe tomorrow".  11:20-  Attending, RN, and patient's god daughter, Elizabeth Tucker were informed that insurance has approved SNF placement and that bed availability is pending.     Expected Discharge Plan: Skilled Nursing Facility Barriers to Discharge: Continued Medical Work up,Insurance Authorization,SNF Pending bed offer  Expected Discharge Plan and Services Expected Discharge Plan: Skilled Nursing Facility       Living arrangements for the past 2 months: Assisted Living Facility                                       Social Determinants of Health (SDOH) Interventions    Readmission Risk Interventions No flowsheet data found.

## 2021-01-25 NOTE — Anesthesia Postprocedure Evaluation (Signed)
Anesthesia Post Note  Patient: GABRIELLE WAKELAND  Procedure(s) Performed: TOTAL HIP ARTHROPLASTY ANTERIOR APPROACH (Left Hip)     Patient location during evaluation: PACU Anesthesia Type: General Level of consciousness: awake and alert Pain management: pain level controlled Vital Signs Assessment: post-procedure vital signs reviewed and stable Respiratory status: spontaneous breathing, nonlabored ventilation, respiratory function stable and patient connected to nasal cannula oxygen Cardiovascular status: blood pressure returned to baseline and stable Postop Assessment: no apparent nausea or vomiting Anesthetic complications: no   No complications documented.  Last Vitals:  Vitals:   01/25/21 1311 01/25/21 2029  BP: 136/64 (!) 144/87  Pulse: 89 94  Resp: 18 17  Temp: 36.8 C 36.8 C  SpO2: 100% 95%    Last Pain:  Vitals:   01/25/21 2029  TempSrc: Oral  PainSc:                  Josemanuel Eakins

## 2021-01-25 NOTE — Evaluation (Signed)
Physical Therapy Evaluation Patient Details Name: Elizabeth Tucker MRN: [id number] DOB: October 13, 1945 Today's Date: 01/25/2021   History of Present Illness  75 yo presented to the ED 01/19/21 with a L hip fracture from an unwitnessed fall. She underwent direct anterior approach L THA 01/19/21. PMH consists of Alzheimer's disease, GERD, and hep C.  Clinical Impression  Pt was seen for mobility on RW and was more capably able to walk and maneuver with minor help from PT.  Pt had a chair following but did not need to sit down until returning to other side of room.  Pt was able to assist with exercises, expresses understanding of all the elements of her routine.  Does have dementia history and will surely need a refresher on all safety and exercise education.    Follow Up Recommendations SNF;Supervision/Assistance - 24 hour    Equipment Recommendations  Rolling walker with 5" wheels;Wheelchair (measurements PT);Wheelchair cushion (measurements PT);3in1 (PT)    Recommendations for Other Services       Precautions / Restrictions Precautions Precautions: Fall Precaution Comments: dementia Restrictions Weight Bearing Restrictions: No LLE Weight Bearing: Weight bearing as tolerated      Mobility  Bed Mobility Overal bed mobility: Needs Assistance Bed Mobility: Supine to Sit;Rolling Rolling: Min assist   Supine to sit: Min assist;Mod assist     General bed mobility comments: pt was able to assist more with bedrail, used bed pad to finish scoot to EOB    Transfers Overall transfer level: Needs assistance Equipment used: Rolling walker (2 wheeled) Transfers: Sit to/from [employer] Sit to Stand: From elevated surface;Min assist         General transfer comment: min assist to power up and control standing balance initially before walking  Ambulation/Gait Ambulation/Gait assistance: Min assist;Min guard Gait Distance (Feet): 35 Feet Assistive device: Rolling walker (2  wheeled);1 person hand held assist Gait Pattern/deviations: Step-to pattern;Step-through pattern;Decreased stride length;Wide base of support;Decreased weight shift to left Gait velocity: reduced Gait velocity interpretation: <1.31 ft/sec, indicative of household ambulator General Gait Details: walking more confidently with control of walker assisted by PT to turn around  [employer]            Wheelchair Mobility    Modified Rankin (Stroke Patients Only)       Balance Overall balance assessment: Needs assistance Sitting-balance support: Bilateral upper extremity supported;Feet supported Sitting balance-Leahy Scale: Good     Standing balance support: Bilateral upper extremity supported;During functional activity Standing balance-Leahy Scale: Poor                               Pertinent Vitals/Pain Pain Assessment: Faces Faces Pain Scale: Hurts little more Pain Location: L hip with mobility Pain Descriptors / Indicators: Guarding Pain Intervention(s): Limited activity within patient's tolerance;Monitored during session;Premedicated before session;Repositioned    Home Living                        Prior Function                 Hand Dominance        Extremity/Trunk Assessment                Communication      Cognition Arousal/Alertness: Awake/alert Behavior During Therapy: WFL for tasks assessed/performed Overall Cognitive Status: History of cognitive impairments - at baseline  General Comments: dementia at baseline, pleasantly confused      General Comments General comments (skin integrity, edema, etc.): pt is more stable today in standing and walking, with a less buckled appearance on LLE to walk.  Able to cover the room and then set up well for chair, safely able to assist sittign descent control    Exercises General Exercises - Lower Extremity Ankle Circles/Pumps: AROM;AAROM;5  reps Quad Sets: AROM;10 reps Hip ABduction/ADduction: AAROM;10 reps   Assessment/Plan    PT Assessment    PT Problem List         PT Treatment Interventions      PT Goals (Current goals can be found in the Care Plan section)  Acute Rehab PT Goals Patient Stated Goal: not stated    Frequency Min 3X/week   Barriers to discharge        Co-evaluation               AM-PAC PT "6 Clicks" Mobility  Outcome Measure Help needed turning from your back to your side while in a flat bed without using bedrails?: A Little Help needed moving from lying on your back to sitting on the side of a flat bed without using bedrails?: A Little Help needed moving to and from a bed to a chair (including a wheelchair)?: A Little Help needed standing up from a chair using your arms (e.g., wheelchair or bedside chair)?: A Little Help needed to walk in hospital room?: A Little Help needed climbing 3-5 steps with a railing? : A Lot 6 Click Score: 17    End of Session Equipment Utilized During Treatment: Gait belt Activity Tolerance: Patient limited by fatigue;Patient limited by lethargy Patient left: with call bell/phone within reach;in chair;with chair alarm set Nurse Communication: Mobility status PT Visit Diagnosis: Other abnormalities of gait and mobility (R26.89);History of falling (Z91.81);Difficulty in walking, not elsewhere classified (R26.2);Pain Pain - Right/Left: Left Pain - part of body: Hip    Time: 5400-8676 PT Time Calculation (min) (ACUTE ONLY): 30 min   Charges:     PT Treatments $Gait Training: 8-22 mins $Therapeutic Exercise: 8-22 mins       Ivar Drape 01/25/2021, 4:32 PM  Samul Dada, PT MS Acute Rehab Dept. Number: [facility] [id number] and [facility] [phone]

## 2021-01-25 NOTE — Progress Notes (Signed)
[facility]                                                                              Patient Demographics  Elizabeth Tucker, is a 75 y.o. female, DOB - 02-19-1946, UQJ:[id number]  Admit date - 01/19/2021   Admitting Physician Jonah Blue, MD  Outpatient Primary MD for the patient is Ngetich, Donalee Citrin, NP  Outpatient specialists:   Presented from: ALF  LOS - 6  days   Medical records reviewed and are as summarized below:    Chief Complaint  Patient presents with  . Fall       Brief summary   Patient is a 75 year old female with history of dementia, Alzheimer's disease, GERD, prior history of hepatitis C, presented with unwitnessed fall.  Patient lives in a assisted living facility.  Per EMS report, she fell onto tile.  Subsequently complained of pain in her left hip. X-rays showed displaced femoral neck fracture on the left side, orthopedics was consulted.  Assessment & Plan    Principal Problem: Unwitnessed mechanical fall with closed left hip fracture, initial encounter [facility]) - Orthopedics consulted, underwent left total hip arthroplasty 6/1 overnight -Continue aspirin 81 mg twice daily for DVT prophylaxis, WBAT on the left hip  -No acute complaints, awaiting skilled nursing facility bed placement.  Insurance has approved however SNF will have delayed tomorrow   Active Problems: Fevers -Spiked a temp on 6/2.  No fevers since then -UA 5/31 had shown no UTI, chest x-ray on admission showed no pneumonia.  -No infectious signs, no leukocytosis.  Repeat chest x-ray clear -Unclear etiology.  Remains afebrile     Dementia with behavioral disturbance (HCC) -Continue Geodon 40 mg daily, trazodone 50 mg at bedtime, Ativan 1 mg every 6 hours as needed, 0.5 mg twice daily -Haldol as needed for agitation -Currently stable no acute issues  Fall on 6/5 overnight - had a unwitnessed fall -  Underwent bilateral shoulder x-ray no fracture or dislocation,  osteoarthritis.  Currently no complaints of any pain.    Class 2 obesity due to excess calories with body mass index (BMI) of 36.0 to 36.9 in adult Estimated body mass index is 36.94 kg/m as calculated from the following:   Height as of this encounter: 5\' 5"  (1.651 m).   Weight as of this encounter: 100.7 kg.  Code Status: Full CODE STATUS DVT Prophylaxis:  SCDs Start: 01/20/21 0141 Place TED hose Start: 01/20/21 0141 SCDs Start: 01/19/21 0958   Level of Care: Level of care: Med-Surg Family Communication: Discussed all imaging results, lab results, explained to the patient's close friend, Ms 01/21/21 on 6/1.     Disposition Plan:     Status is: Inpatient Remains inpatient appropriate because:Inpatient level of care appropriate due to severity of illness   Dispo: The patient is from: ALF              Anticipated d/c is to: SNF              Patient currently is not medically stable to d/c.  Awaiting skilled nursing facility bed placement  difficult to place patient No  Time Spent in minutes: 15 minutes  Procedures:  Left total hip arthroplasty 6/1  Consultants:   Orthopedics  Antimicrobials:   Anti-infectives (From admission, onward)   Start     Dose/Rate Route Frequency Ordered Stop   01/20/21 0600  ceFAZolin (ANCEF) IVPB 2g/100 mL premix        2 g 200 mL/hr over 30 Minutes Intravenous On call to O.R. 01/19/21 1740 01/19/21 2126   01/20/21 0300  ceFAZolin (ANCEF) IVPB 2g/100 mL premix        2 g 200 mL/hr over 30 Minutes Intravenous Every 6 hours 01/20/21 0140 01/20/21 1023         Medications  Scheduled Meds: . aspirin  81 mg Oral BID PC  . docusate sodium  100 mg Oral BID  . feeding supplement  237 mL Oral BID BM  . LORazepam  0.5 mg Oral BID  . multivitamin with minerals  1 tablet Oral Daily  . pantoprazole  40 mg Oral Daily  . traZODone  50 mg Oral QHS  . ziprasidone  40 mg Oral Daily   Continuous Infusions: . sodium chloride    .  methocarbamol (ROBAXIN) IV     PRN Meds:.acetaminophen, acetaminophen, bisacodyl, haloperidol, HYDROcodone-acetaminophen, HYDROcodone-acetaminophen, HYDROcodone-acetaminophen, LORazepam, menthol-cetylpyridinium **OR** phenol, methocarbamol **OR** methocarbamol (ROBAXIN) IV, metoCLOPramide **OR** metoCLOPramide (REGLAN) injection, morphine injection, morphine injection, ondansetron **OR** ondansetron (ZOFRAN) IV, polyethylene glycol      Subjective:   Elizabeth Tucker was seen and examined today.  Pleasant, alert and awake.  No acute complaints.  Overnight no acute issues.  No fevers or chills.  Appears to be comfortable.  Has dementia.     Objective:   Vitals:   01/24/21 0720 01/24/21 1504 01/24/21 2021 01/25/21 0607  BP: 111/73 (!) 151/98 100/69 111/69  Pulse: 84 96 92 87  Resp: Temp: 98.1 F (36.7 C) 97.9 F (36.6 C) (!) 97.3 F (36.3 C) 97.8 F (36.6 C)  TempSrc: Oral Oral Oral Oral  SpO2: 99% 98% 96% 99%  Weight:      Height:       No intake or output data in the 24 hours ending 01/25/21 1158   Wt Readings from Last 3 Encounters:  01/19/21 100.7 kg  11/27/20 82.3 kg  05/26/20 85.8 kg    Physical Exam  General: Alert and oriented x self, NAD, comfortable  Cardiovascular: S1 S2 clear, RRR. No pedal edema b/l  Respiratory: CTAB, no wheezing, rales or rhonchi  Gastrointestinal: Soft, nontender, nondistended, NBS  Ext: no pedal edema bilaterally  Neuro: no new deficits  Psych: pleasant, dementia   Data Reviewed:  I have personally reviewed following labs and imaging studies  Micro Results Recent Results (from the past 240 hour(s))  Resp Panel by RT-PCR (Flu A&B, Covid) Nasopharyngeal Swab     Status: None   Collection Time: 01/19/21  7:38 AM   Specimen: Nasopharyngeal Swab; Nasopharyngeal(NP) swabs in vial transport medium  Result Value Ref Range Status   SARS Coronavirus 2 by RT PCR NEGATIVE NEGATIVE Final    Comment: (NOTE) SARS-CoV-2  target nucleic acids are NOT DETECTED.  The SARS-CoV-2 RNA is generally detectable in upper respiratory specimens during the acute phase of infection. The lowest concentration of SARS-CoV-2 viral copies this assay can detect is 138 copies/mL. A negative result does not preclude SARS-Cov-2 infection and should not be used as the sole basis for treatment or other patient management decisions. A negative result may occur with  improper  specimen collection/handling, submission of specimen other than nasopharyngeal swab, presence of viral mutation(s) within the areas targeted by this assay, and inadequate number of viral copies(<138 copies/mL). A negative result must be combined with clinical observations, patient history, and epidemiological information. The expected result is Negative.  Fact Sheet for Patients:  BloggerCourse.com  Fact Sheet for Healthcare Providers:  SeriousBroker.it  This test is no t yet approved or cleared by the [place] FDA and  has been authorized for detection and/or diagnosis of SARS-CoV-2 by FDA under an Emergency Use Authorization (EUA). This EUA will remain  in effect (meaning this test can be used) for the duration of the COVID-19 declaration under Section 564(b)(1) of the Act, 21 U.S.C.section 360bbb-3(b)(1), unless the authorization is terminated  or revoked sooner.       Influenza A by PCR NEGATIVE NEGATIVE Final   Influenza B by PCR NEGATIVE NEGATIVE Final    Comment: (NOTE) The Xpert Xpress SARS-CoV-2/FLU/RSV plus assay is intended as an aid in the diagnosis of influenza from Nasopharyngeal swab specimens and should not be used as a sole basis for treatment. Nasal washings and aspirates are unacceptable for Xpert Xpress SARS-CoV-2/FLU/RSV testing.  Fact Sheet for Patients: BloggerCourse.com  Fact Sheet for Healthcare  Providers: SeriousBroker.it  This test is not yet approved or cleared by the [place] FDA and has been authorized for detection and/or diagnosis of SARS-CoV-2 by FDA under an Emergency Use Authorization (EUA). This EUA will remain in effect (meaning this test can be used) for the duration of the COVID-19 declaration under Section 564(b)(1) of the Act, 21 U.S.C. section 360bbb-3(b)(1), unless the authorization is terminated or revoked.  Performed at [facility], [address], [place], [place] [address]   Surgical pcr screen     Status: None   Collection Time: 01/19/21  6:21 PM   Specimen: Nasal Mucosa; Nasal Swab  Result Value Ref Range Status   MRSA, PCR NEGATIVE NEGATIVE Final   Staphylococcus aureus NEGATIVE NEGATIVE Final    Comment: (NOTE) The Xpert SA Assay (FDA approved for NASAL specimens in patients 65 years of age and older), is one component of a comprehensive surveillance program. It is not intended to diagnose infection nor to guide or monitor treatment. Performed at [facility], [address] [place], [place] [address]   Culture, blood (Routine X 2) w Reflex to ID Panel     Status: None (Preliminary result)   Collection Time: 01/21/21  1:34 PM   Specimen: BLOOD LEFT ARM  Result Value Ref Range Status   Specimen Description BLOOD LEFT ARM  Final   Special Requests   Final    BOTTLES DRAWN AEROBIC AND ANAEROBIC Blood Culture adequate volume   Culture   Final    NO GROWTH 4 DAYS Performed at [facility], [address] [place], [place] [address]    Report Status PENDING  Incomplete  Culture, blood (Routine X 2) w Reflex to ID Panel     Status: None (Preliminary result)   Collection Time: 01/21/21  1:42 PM   Specimen: BLOOD LEFT ARM  Result Value Ref Range Status   Specimen Description BLOOD LEFT ARM  Final   Special Requests   Final    BOTTLES DRAWN AEROBIC ONLY Blood Culture results may not be optimal due  to an inadequate volume of blood received in culture bottles   Culture   Final    NO GROWTH 4 DAYS Performed at [facility], [address], [place], [place] [address]    Report Status PENDING  Incomplete  Resp Panel by RT-PCR (Flu A&B, Covid) Nasopharyngeal Swab     Status: None   Collection Time: 01/24/21  3:39 PM   Specimen: Nasopharyngeal Swab; Nasopharyngeal(NP) swabs in vial transport medium  Result Value Ref Range Status   SARS Coronavirus 2 by RT PCR NEGATIVE NEGATIVE Final    Comment: (NOTE) SARS-CoV-2 target nucleic acids are NOT DETECTED.  The SARS-CoV-2 RNA is generally detectable in upper respiratory specimens during the acute phase of infection. The lowest concentration of SARS-CoV-2 viral copies this assay can detect is 138 copies/mL. A negative result does not preclude SARS-Cov-2 infection and should not be used as the sole basis for treatment or other patient management decisions. A negative result may occur with  improper specimen collection/handling, submission of specimen other than nasopharyngeal swab, presence of viral mutation(s) within the areas targeted by this assay, and inadequate number of viral copies(<138 copies/mL). A negative result must be combined with clinical observations, patient history, and epidemiological information. The expected result is Negative.  Fact Sheet for Patients:  BloggerCourse.com  Fact Sheet for Healthcare Providers:  SeriousBroker.it  This test is no t yet approved or cleared by the [place] FDA and  has been authorized for detection and/or diagnosis of SARS-CoV-2 by FDA under an Emergency Use Authorization (EUA). This EUA will remain  in effect (meaning this test can be used) for the duration of the COVID-19 declaration under Section 564(b)(1) of the Act, 21 U.S.C.section 360bbb-3(b)(1), unless the authorization is terminated  or revoked sooner.        Influenza A by PCR NEGATIVE NEGATIVE Final   Influenza B by PCR NEGATIVE NEGATIVE Final    Comment: (NOTE) The Xpert Xpress SARS-CoV-2/FLU/RSV plus assay is intended as an aid in the diagnosis of influenza from Nasopharyngeal swab specimens and should not be used as a sole basis for treatment. Nasal washings and aspirates are unacceptable for Xpert Xpress SARS-CoV-2/FLU/RSV testing.  Fact Sheet for Patients: BloggerCourse.com  Fact Sheet for Healthcare Providers: SeriousBroker.it  This test is not yet approved or cleared by the [place] FDA and has been authorized for detection and/or diagnosis of SARS-CoV-2 by FDA under an Emergency Use Authorization (EUA). This EUA will remain in effect (meaning this test can be used) for the duration of the COVID-19 declaration under Section 564(b)(1) of the Act, 21 U.S.C. section 360bbb-3(b)(1), unless the authorization is terminated or revoked.  Performed at [facility], [address], [place], [place] [address]     Radiology Reports DG Chest 1 View  Result Date: 01/19/2021 CLINICAL DATA:  Preoperative exam.  Fall. EXAM: CHEST  1 VIEW COMPARISON:  No recent prior. FINDINGS: The heart size and mediastinal contours are within normal limits. Both lungs are clear. No acute bony abnormality. Degenerative changes scoliosis thoracic spine. IMPRESSION: No active disease. Electronically Signed   By: Maisie Fus  Register   On: 01/19/2021 07:36   CT HEAD WO CONTRAST  Result Date: 01/19/2021 CLINICAL DATA:  75 year old female with history of trauma from a fall with injury to the head. EXAM: CT HEAD WITHOUT CONTRAST CT CERVICAL SPINE WITHOUT CONTRAST TECHNIQUE: Multidetector CT imaging of the head and cervical spine was performed following the standard protocol without intravenous contrast. Multiplanar CT image reconstructions of the cervical spine were also generated. COMPARISON:  No priors.  FINDINGS: CT HEAD FINDINGS Brain: Very mild cerebral atrophy. Patchy areas of mild decreased  attenuation are noted throughout the periventricular white matter of the cerebral hemispheres bilaterally, compatible with chronic microvascular ischemic disease. No evidence of acute infarction, hemorrhage, hydrocephalus, extra-axial collection or mass lesion/mass effect. Vascular: No hyperdense vessel or unexpected calcification. Skull: Normal. Negative for fracture or focal lesion. Sinuses/Orbits: No acute finding. Other: None. CT CERVICAL SPINE FINDINGS Alignment: Normal. Skull base and vertebrae: No acute fracture. No primary bone lesion or focal pathologic process. Soft tissues and spinal canal: No prevertebral fluid or swelling. No visible canal hematoma. Disc levels: Multilevel degenerative disc disease, most pronounced at C6-C7 and C7-T1. Mild multilevel facet arthropathy. Upper chest: Unremarkable. Other: None. IMPRESSION: 1. No evidence of significant acute traumatic injury to the skull, brain or cervical spine. 2. Very mild cerebral atrophy and chronic microvascular ischemic changes in the periventricular white matter of the cerebral hemispheres. 3. Multilevel degenerative disc disease and cervical spondylosis, as above. Electronically Signed   By: Trudie Reedaniel  Entrikin M.D.   On: 01/19/2021 07:48   CT CERVICAL SPINE WO CONTRAST  Result Date: 01/19/2021 CLINICAL DATA:  75 year old female with history of trauma from a fall with injury to the head. EXAM: CT HEAD WITHOUT CONTRAST CT CERVICAL SPINE WITHOUT CONTRAST TECHNIQUE: Multidetector CT imaging of the head and cervical spine was performed following the standard protocol without intravenous contrast. Multiplanar CT image reconstructions of the cervical spine were also generated. COMPARISON:  No priors. FINDINGS: CT HEAD FINDINGS Brain: Very mild cerebral atrophy. Patchy areas of mild decreased attenuation are noted throughout the periventricular white matter of  the cerebral hemispheres bilaterally, compatible with chronic microvascular ischemic disease. No evidence of acute infarction, hemorrhage, hydrocephalus, extra-axial collection or mass lesion/mass effect. Vascular: No hyperdense vessel or unexpected calcification. Skull: Normal. Negative for fracture or focal lesion. Sinuses/Orbits: No acute finding. Other: None. CT CERVICAL SPINE FINDINGS Alignment: Normal. Skull base and vertebrae: No acute fracture. No primary bone lesion or focal pathologic process. Soft tissues and spinal canal: No prevertebral fluid or swelling. No visible canal hematoma. Disc levels: Multilevel degenerative disc disease, most pronounced at C6-C7 and C7-T1. Mild multilevel facet arthropathy. Upper chest: Unremarkable. Other: None. IMPRESSION: 1. No evidence of significant acute traumatic injury to the skull, brain or cervical spine. 2. Very mild cerebral atrophy and chronic microvascular ischemic changes in the periventricular white matter of the cerebral hemispheres. 3. Multilevel degenerative disc disease and cervical spondylosis, as above. Electronically Signed   By: Trudie Reedaniel  Entrikin M.D.   On: 01/19/2021 07:48   Pelvis Portable  Result Date: 01/19/2021 CLINICAL DATA:  Postop left hip EXAM: PORTABLE PELVIS 1-2 VIEWS COMPARISON:  01/19/2021 FINDINGS: Interval left hip replacement with intact hardware and normal alignment. Gas in the soft tissues consistent with recent surgery. IMPRESSION: Interval left hip replacement with expected postsurgical change Electronically Signed   By: Jasmine PangKim  Fujinaga M.D.   On: 01/19/2021 23:32   DG Chest Port 1 View  Result Date: 01/21/2021 CLINICAL DATA:  Fever EXAM: PORTABLE CHEST 1 VIEW COMPARISON:  Portable exam 1411 hours compared to 01/19/2021 FINDINGS: Upper normal heart size. Mediastinal contours and pulmonary vascularity normal. Atherosclerotic calcification aorta. Minimal bibasilar atelectasis. Lungs otherwise clear. No infiltrate, pleural  effusion, or pneumothorax. Bones demineralized. IMPRESSION: Minimal bibasilar atelectasis. Aortic Atherosclerosis (ICD10-I70.0). Electronically Signed   By: Ulyses SouthwardMark  Boles M.D.   On: 01/21/2021 14:26   DG Shoulder Left Port  Result Date: 01/24/2021 CLINICAL DATA:  Pain.  Recent fall EXAM: LEFT SHOULDER: 3 V COMPARISON:  None. FINDINGS: Frontal, oblique, and Y scapular images obtained.  No acute fracture or dislocation. There is moderate narrowing of the acromioclavicular joint with milder narrowing of the glenohumeral joint. No erosive change. There is bony overgrowth along the inferior acromioclavicular joint. Visualized left lung clear. 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 Bony overgrowth along the inferior acromioclavicular joint potentially places patient at increased risk for impingement syndrome. No acute fracture or dislocation. Electronically Signed   By: Bretta Bang III M.D.   On: 01/24/2021 08:18   DG Shoulder Right Port  Result Date: 01/24/2021 CLINICAL DATA:  Pain following recent trauma EXAM: PORTABLE RIGHT SHOULDER: 3 V COMPARISON:  None. FINDINGS: Frontal, oblique, and Y scapular images were obtained. No fracture or dislocation. There is mild generalized joint space narrowing. No erosive change. Visualized right lung clear. IMPRESSION: Mild generalized osteoarthritic change.  No fracture or dislocation. Electronically Signed   By: Bretta Bang III M.D.   On: 01/24/2021 08:19   DG C-Arm 1-60 Min  Result Date: 01/19/2021 CLINICAL DATA:  Anterior left hip arthroplasty EXAM: DG C-ARM 1-60 MIN; DG HIP (WITH OR WITHOUT PELVIS) 2-3V LEFT FLUOROSCOPY TIME:  Fluoroscopy Time:  33 seconds Radiation Exposure Index (if provided by the fluoroscopic device): 1.69 mGy Number of Acquired Spot Images: 3 COMPARISON:  01/19/2021 FINDINGS: Three fluoroscopic images are obtained during the performance of the procedure and are provided for interpretation only. A  left hip arthroplasty is identified in the expected position without signs of complication. IMPRESSION: 1. Unremarkable left hip arthroplasty. Please refer to the operative report. Electronically Signed   By: Sharlet Salina M.D.   On: 01/19/2021 22:14   DG HIP UNILAT WITH PELVIS 2-3 VIEWS LEFT  Result Date: 01/19/2021 CLINICAL DATA:  Anterior left hip arthroplasty EXAM: DG C-ARM 1-60 MIN; DG HIP (WITH OR WITHOUT PELVIS) 2-3V LEFT FLUOROSCOPY TIME:  Fluoroscopy Time:  33 seconds Radiation Exposure Index (if provided by the fluoroscopic device): 1.69 mGy Number of Acquired Spot Images: 3 COMPARISON:  01/19/2021 FINDINGS: Three fluoroscopic images are obtained during the performance of the procedure and are provided for interpretation only. A left hip arthroplasty is identified in the expected position without signs of complication. IMPRESSION: 1. Unremarkable left hip arthroplasty. Please refer to the operative report. Electronically Signed   By: Sharlet Salina M.D.   On: 01/19/2021 22:14   DG Hip Unilat With Pelvis 2-3 Views Left  Result Date: 01/19/2021 CLINICAL DATA:  75 year old female with left side hip pain and rotation. Possible fracture. Preoperative exam. EXAM: DG HIP (WITH OR WITHOUT PELVIS) 2-3V LEFT COMPARISON:  None. FINDINGS: Left femoral head is normally located. But a left femoral neck fracture is visible on image 3 with mild impaction. The left intertrochanteric segment and proximal left femoral shaft remain intact. No pelvis fracture. SI joints appear normal. Grossly intact proximal right femur. Negative visible bowel gas pattern.  Incidental pelvic phleboliths. IMPRESSION: Positive for left femoral neck fracture with mild impaction. Electronically Signed   By: Odessa Fleming M.D.   On: 01/19/2021 07:43    Lab Data:  CBC: Recent Labs  Lab 01/19/21 0702 01/20/21 0232 01/21/21 1334 01/23/21 0211  WBC 5.1 8.0 4.7 3.6*  NEUTROABS 4.1  --   --   --   HGB 14.2 13.3 11.8* 10.3*  HCT 41.8  38.6 35.0* 30.4*  MCV 89.1 88.3 90.0 89.4  PLT PLATELET CLUMPS NOTED ON SMEAR, UNABLE TO ESTIMATE 104* 97* 105*   Basic Metabolic Panel: Recent Labs  Lab 01/19/21 0702 01/20/21 0232 01/23/21  0211  NA 135 135 134*  K 3.8 4.0 4.6  CL 103 101 99  CO2 GLUCOSE 122* 122* 113*  BUN CREATININE 0.75 0.71 0.83  CALCIUM 9.2 8.7* 8.6*   GFR: Estimated Creatinine Clearance: 72.1 mL/min (by C-G formula based on SCr of 0.83 mg/dL). Liver Function Tests: No results for input(s): AST, ALT, ALKPHOS, BILITOT, PROT, ALBUMIN in the last 168 hours. No results for input(s): LIPASE, AMYLASE in the last 168 hours. No results for input(s): AMMONIA in the last 168 hours. Coagulation Profile: Recent Labs  Lab 01/19/21 0702  INR 1.1   Cardiac Enzymes: No results for input(s): CKTOTAL, CKMB, CKMBINDEX, TROPONINI in the last 168 hours. BNP (last 3 results) No results for input(s): PROBNP in the last 8760 hours. HbA1C: No results for input(s): HGBA1C in the last 72 hours. CBG: Recent Labs  Lab 01/20/21 1139  GLUCAP 136*   Lipid Profile: No results for input(s): CHOL, HDL, LDLCALC, TRIG, CHOLHDL, LDLDIRECT in the last 72 hours. Thyroid Function Tests: No results for input(s): TSH, T4TOTAL, FREET4, T3FREE, THYROIDAB in the last 72 hours. Anemia Panel: No results for input(s): VITAMINB12, FOLATE, FERRITIN, TIBC, IRON, RETICCTPCT in the last 72 hours. Urine analysis:    Component Value Date/Time   COLORURINE YELLOW 01/19/2021 1001   APPEARANCEUR CLEAR 01/19/2021 1001   LABSPEC 1.013 01/19/2021 1001   PHURINE 6.0 01/19/2021 1001   GLUCOSEU NEGATIVE 01/19/2021 1001   HGBUR NEGATIVE 01/19/2021 1001   HGBUR negative 02/01/2008 1038   BILIRUBINUR NEGATIVE 01/19/2021 1001   KETONESUR NEGATIVE 01/19/2021 1001   PROTEINUR NEGATIVE 01/19/2021 1001   UROBILINOGEN 1.0 08/14/2008 0851   NITRITE NEGATIVE 01/19/2021 1001   LEUKOCYTESUR TRACE (A) 01/19/2021 1001     Legrande Hao  M.D. [facility] 01/25/2021, 11:58 AM  Available via Epic secure chat 7am-7pm After 7 pm, please refer to night coverage provider listed on amion.

## 2021-01-26 LAB — CULTURE, BLOOD (ROUTINE X 2)
Culture: NO GROWTH
Culture: NO GROWTH
Special Requests: ADEQUATE

## 2021-01-26 MED ORDER — HALOPERIDOL 5 MG PO TABS: 5.0000 mg | ORAL_TABLET | Freq: Four times a day (QID) | ORAL | 0 refills | Status: DC | PRN

## 2021-01-26 MED ORDER — LORAZEPAM 1 MG PO TABS: 1.0000 mg | ORAL_TABLET | Freq: Four times a day (QID) | ORAL | 0 refills | Status: DC | PRN

## 2021-01-26 MED ORDER — LORAZEPAM 0.5 MG PO TABS: 0.5000 mg | ORAL_TABLET | Freq: Two times a day (BID) | ORAL | 0 refills | Status: DC

## 2021-01-26 NOTE — Progress Notes (Signed)
Nutrition Follow-up  DOCUMENTATION CODES:   Obesity unspecified  INTERVENTION:   -Continue MVI with minerals daily -Continue Ensure Enlive po BID, each supplement provides 350 kcal and 20 grams of protein  NUTRITION DIAGNOSIS:   Increased nutrient needs related to post-op healing as evidenced by estimated needs.  [place]  GOAL:   Patient will meet greater than or equal to 90% of their needs  Progressing   MONITOR:   PO intake,Supplement acceptance,Labs,Weight trends,Skin,I & O's  REASON FOR ASSESSMENT:   Consult Assessment of nutrition requirement/status,Hip fracture protocol  ASSESSMENT:   Elizabeth Tucker is a 75 y.o. female with medical history significant of dementia presenting with an unwitnessed fall.  The patient lives in ALF.  She is currently oriented x 3 but is unable to provide history of the event, thinks she is in the hospital for a physical.  Unable to reach family, message left on work voice mail.  5/31- s/p Procedure(s): TOTAL HIP ARTHROPLASTY ANTERIOR APPROACH (Left)  Pt with good appetite. She is consuming 75-100% of meals. She is also taking Ensure Enlive supplements per MAR.   Medications reviewed and include colace and ativan.   Per TOC notes, pt awaiting SNF placement for discharge.   Labs reviewed: Na: 134.   Diet Order:   Diet Order            Diet regular Room service appropriate? No; Fluid consistency: Thin  Diet effective now                 EDUCATION NEEDS:   No education needs have been identified at this time  Skin:  Skin Assessment: Skin Integrity Issues: Skin Integrity Issues:: Incisions Incisions: closed lt hip  Last BM:  01/26/21  Height:   Ht Readings from Last 1 Encounters:  01/19/21 5\' 5"  (1.651 m)    Weight:   Wt Readings from Last 1 Encounters:  01/19/21 100.7 kg    Ideal Body Weight:  56.8 kg  BMI:  Body mass index is 36.94 kg/m.  Estimated Nutritional Needs:   Kcal:  1700-1900  Protein:   85-100 grams  Fluid:  > 1.7 L    01/21/21, RD, LDN, CDCES Registered Dietitian II Certified Diabetes Care and Education Specialist Please refer to [facility] for RD and/or RD on-call/weekend/after hours pager

## 2021-01-26 NOTE — Progress Notes (Signed)
Physical Therapy Treatment Patient Details Name: Elizabeth Tucker MRN: [id number] DOB: 06/12/1946 Today's Date: 01/26/2021    History of Present Illness 75 yo presented to the ED 01/19/21 with a L hip fracture from an unwitnessed fall. She underwent direct anterior approach L THA 01/19/21. PMH consists of Alzheimer's disease, GERD, and hep C.    PT Comments    Pt continues to make good progress with mobility. Ambulation distance and quality continue to improve. Continue to recommend SNF for further rehab.    Follow Up Recommendations  SNF;Supervision/Assistance - 24 hour     Equipment Recommendations  Rolling walker with 5" wheels;Wheelchair (measurements PT);Wheelchair cushion (measurements PT);3in1 (PT)    Recommendations for Other Services       Precautions / Restrictions Precautions Precautions: Fall Precaution Comments: dementia Restrictions Weight Bearing Restrictions: Yes LLE Weight Bearing: Weight bearing as tolerated    Mobility  Bed Mobility Overal bed mobility: Needs Assistance Bed Mobility: Supine to Sit Rolling: Min assist   Supine to sit: Min assist     General bed mobility comments: Assist to bring LLE off of bed and to elevate trunk into sitting    Transfers Overall transfer level: Needs assistance Equipment used: Rolling walker (2 wheeled) Transfers: Sit to/from [facility] Sit to Stand: Min assist         General transfer comment: assist to bring hips up and for balance. Verbal cues for hand placement  Ambulation/Gait Ambulation/Gait assistance: Min assist;Min guard Gait Distance (Feet): 140 Feet Assistive device: Rolling walker (2 wheeled);1 person hand held assist Gait Pattern/deviations: Step-through pattern;Decreased stride length;Wide base of support;Decreased weight shift to left;Antalgic Gait velocity: decr Gait velocity interpretation: <1.31 ft/sec, indicative of household ambulator General Gait Details: Assist for  safety. Verbal cues for direction and to look up   Stairs             Wheelchair Mobility    Modified Rankin (Stroke Patients Only)       Balance Overall balance assessment: Needs assistance Sitting-balance support: Feet supported;No upper extremity supported Sitting balance-Leahy Scale: Good     Standing balance support: Bilateral upper extremity supported;During functional activity Standing balance-Leahy Scale: Poor Standing balance comment: walker and min guard for static standing                            Cognition Arousal/Alertness: Awake/alert Behavior During Therapy: WFL for tasks assessed/performed Overall Cognitive Status: History of cognitive impairments - at baseline                                 General Comments: dementia at baseline, pleasantly confused      Exercises      General Comments        Pertinent Vitals/Pain Pain Assessment: Faces Faces Pain Scale: Hurts a little bit Pain Location: L hip with mobility Pain Descriptors / Indicators: Guarding;Sore Pain Intervention(s): Limited activity within patient's tolerance;Monitored during session    Home Living                      Prior Function            PT Goals (current goals can now be found in the care plan section) Acute Rehab PT Goals PT Goal Formulation: Patient unable to participate in goal setting Time For Goal Achievement: 02/02/21 Potential to Achieve Goals: Good Progress towards  PT goals: Goals met and updated - see care plan    Frequency    Min 3X/week      PT Plan Current plan remains appropriate    Co-evaluation              AM-PAC PT "6 Clicks" Mobility   Outcome Measure  Help needed turning from your back to your side while in a flat bed without using bedrails?: A Little Help needed moving from lying on your back to sitting on the side of a flat bed without using bedrails?: A Little Help needed moving to and from  a bed to a chair (including a wheelchair)?: A Little Help needed standing up from a chair using your arms (e.g., wheelchair or bedside chair)?: A Little Help needed to walk in hospital room?: A Little Help needed climbing 3-5 steps with a railing? : A Lot 6 Click Score: 17    End of Session Equipment Utilized During Treatment: Gait belt Activity Tolerance: Patient tolerated treatment well Patient left: with call bell/phone within reach;in chair;with chair alarm set Nurse Communication: Mobility status PT Visit Diagnosis: Other abnormalities of gait and mobility (R26.89);History of falling (Z91.81);Difficulty in walking, not elsewhere classified (R26.2);Pain Pain - Right/Left: Left Pain - part of body: Hip     Time: 0233-4356 PT Time Calculation (min) (ACUTE ONLY): 21 min  Charges:  $Gait Training: 8-22 mins                     [place] Pager [phone] Office [place] 01/26/2021, 9:18 AM

## 2021-01-26 NOTE — Care Management Important Message (Signed)
Important Message  Patient Details  Name: Elizabeth Tucker MRN: [id number] Date of Birth: 1946-06-11   Medicare Important Message Given:  Yes     [name] P Domingo Fuson 01/26/2021, 1:45 PM

## 2021-01-26 NOTE — Progress Notes (Signed)
[facility]                                                                              Patient Demographics  Elizabeth Tucker, is a 75 y.o. female, DOB - May 03, 1946, FOY:[id number]  Admit date - 01/19/2021   Admitting Physician Jonah Blue, MD  Outpatient Primary MD for the patient is Ngetich, Donalee Citrin, NP  Outpatient specialists:   Presented from: ALF  LOS - 7  days   Medical records reviewed and are as summarized below:    Chief Complaint  Patient presents with  . Fall       Brief summary   Patient is a 75 year old female with history of dementia, Alzheimer's disease, GERD, prior history of hepatitis C, presented with unwitnessed fall.  Patient lives in a assisted living facility.  Per EMS report, she fell onto tile.  Subsequently complained of pain in her left hip. X-rays showed displaced femoral neck fracture on the left side, orthopedics was consulted.  Interim summary 01/20/2021 - 01/26/2021 Patient was admitted with left hip fracture, underwent left total hip arthroplasty on 6/1  No acute issues, currently awaiting skilled nursing facility placement.  Medically stable.  Discharge delayed due to COVID outbreak at the SNF  Assessment & Plan    Principal Problem: Unwitnessed mechanical fall with closed left hip fracture, initial encounter [facility]) - Orthopedics consulted, underwent left total hip arthroplasty 6/1 overnight -Continue aspirin 81 mg twice daily for DVT prophylaxis, WBAT on the left hip  -No acute issues.  Awaiting SNF placement   Active Problems: Fevers -Spiked a temp on 6/2.  No fevers since then -UA 5/31 had shown no UTI, chest x-ray on admission showed no pneumonia.  -No infectious signs, no leukocytosis.  Repeat chest x-ray clear -Has remained afebrile since then, unclear etiology     Dementia with behavioral disturbance (HCC) -Continue Geodon 40 mg daily, trazodone 50 mg at bedtime, Ativan 1 mg every 6 hours as needed, 0.5 mg  twice daily -Haldol as needed for agitation -No acute issues, pleasant  Fall on 6/5 overnight - had a unwitnessed fall -  Underwent bilateral shoulder x-ray no fracture or dislocation, osteoarthritis.  Currently no complaints of any pain.    Class 2 obesity due to excess calories with body mass index (BMI) of 36.0 to 36.9 in adult Estimated body mass index is 36.94 kg/m as calculated from the following:   Height as of this encounter: 5\' 5"  (1.651 m).   Weight as of this encounter: 100.7 kg.  Code Status: Full CODE STATUS DVT Prophylaxis:  SCDs Start: 01/20/21 0141 Place TED hose Start: 01/20/21 0141 SCDs Start: 01/19/21 0958   Level of Care: Level of care: Med-Surg Family Communication: Discussed all imaging results, lab results, explained to the patient's  friend, Ms 01/21/21.  Patient has been waiting for skilled nursing facility   Disposition Plan:     Status is: Inpatient Remains inpatient appropriate because:Inpatient level of care appropriate due to severity of illness   Dispo: The patient is from: ALF              Anticipated d/c is  to: SNF              Patient currently is medically stable to d/c.  Awaiting skilled nursing facility bed placement  difficult to place patient No  Time Spent in minutes: 15 minutes  Procedures:  Left total hip arthroplasty 6/1  Consultants:   Orthopedics  Antimicrobials:   Anti-infectives (From admission, onward)   Start     Dose/Rate Route Frequency Ordered Stop   01/20/21 0600  ceFAZolin (ANCEF) IVPB 2g/100 mL premix        2 g 200 mL/hr over 30 Minutes Intravenous On call to O.R. 01/19/21 1740 01/19/21 2126   01/20/21 0300  ceFAZolin (ANCEF) IVPB 2g/100 mL premix        2 g 200 mL/hr over 30 Minutes Intravenous Every 6 hours 01/20/21 0140 01/20/21 1023         Medications  Scheduled Meds: . aspirin  81 mg Oral BID PC  . docusate sodium  100 mg Oral BID  . feeding supplement  237 mL Oral BID BM  . LORazepam   0.5 mg Oral BID  . multivitamin with minerals  1 tablet Oral Daily  . pantoprazole  40 mg Oral Daily  . traZODone  50 mg Oral QHS  . ziprasidone  40 mg Oral Daily   Continuous Infusions: . sodium chloride    . methocarbamol (ROBAXIN) IV     PRN Meds:.acetaminophen, acetaminophen, bisacodyl, haloperidol, HYDROcodone-acetaminophen, HYDROcodone-acetaminophen, HYDROcodone-acetaminophen, LORazepam, menthol-cetylpyridinium **OR** phenol, methocarbamol **OR** methocarbamol (ROBAXIN) IV, metoCLOPramide **OR** metoCLOPramide (REGLAN) injection, morphine injection, morphine injection, ondansetron **OR** ondansetron (ZOFRAN) IV, polyethylene glycol      Subjective:   Elizabeth Tucker was seen and examined today.  No acute complaints.  No nausea vomiting or abdominal pain.  No fevers.  Overnight no acute issues.  Has dementia.  Objective:   Vitals:   01/25/21 1311 01/25/21 2029 01/26/21 0459 01/26/21 0820  BP: 136/64 (!) 144/87 118/68 (!) 91/54  Pulse: 89 94 90 90  Resp: Temp: 98.2 F (36.8 C) 98.3 F (36.8 C) 98.5 F (36.9 C) 99.2 F (37.3 C)  TempSrc: Oral Oral Oral Oral  SpO2: 100% 95% 98% 99%  Weight:      Height:       No intake or output data in the 24 hours ending 01/26/21 1501   Wt Readings from Last 3 Encounters:  01/19/21 100.7 kg  11/27/20 82.3 kg  05/26/20 85.8 kg   Physical Exam  General: Alert and oriented to self, has dementia.  Pleasant NAD  Cardiovascular: S1 S2 clear, RRR. No pedal edema b/l  Respiratory: CTA B  Gastrointestinal: Soft, nontender, nondistended, NBS  Ext: no pedal edema bilaterally  Neuro: no new deficits  Psych: pleasant, dementia   Data Reviewed:  I have personally reviewed following labs and imaging studies  Micro Results Recent Results (from the past 240 hour(s))  Resp Panel by RT-PCR (Flu A&B, Covid) Nasopharyngeal Swab     Status: None   Collection Time: 01/19/21  7:38 AM   Specimen: Nasopharyngeal Swab;  Nasopharyngeal(NP) swabs in vial transport medium  Result Value Ref Range Status   SARS Coronavirus 2 by RT PCR NEGATIVE NEGATIVE Final    Comment: (NOTE) SARS-CoV-2 target nucleic acids are NOT DETECTED.  The SARS-CoV-2 RNA is generally detectable in upper respiratory specimens during the acute phase of infection. The lowest concentration of SARS-CoV-2 viral copies this assay can detect is 138 copies/mL. A negative result does not preclude  SARS-Cov-2 infection and should not be used as the sole basis for treatment or other patient management decisions. A negative result may occur with  improper specimen collection/handling, submission of specimen other than nasopharyngeal swab, presence of viral mutation(s) within the areas targeted by this assay, and inadequate number of viral copies(<138 copies/mL). A negative result must be combined with clinical observations, patient history, and epidemiological information. The expected result is Negative.  Fact Sheet for Patients:  BloggerCourse.comhttps://www.fda.gov/media/152166/download  Fact Sheet for Healthcare Providers:  SeriousBroker.ithttps://www.fda.gov/media/152162/download  This test is no t yet approved or cleared by the [place] FDA and  has been authorized for detection and/or diagnosis of SARS-CoV-2 by FDA under an Emergency Use Authorization (EUA). This EUA will remain  in effect (meaning this test can be used) for the duration of the COVID-19 declaration under Section 564(b)(1) of the Act, 21 U.S.C.section 360bbb-3(b)(1), unless the authorization is terminated  or revoked sooner.       Influenza A by PCR NEGATIVE NEGATIVE Final   Influenza B by PCR NEGATIVE NEGATIVE Final    Comment: (NOTE) The Xpert Xpress SARS-CoV-2/FLU/RSV plus assay is intended as an aid in the diagnosis of influenza from Nasopharyngeal swab specimens and should not be used as a sole basis for treatment. Nasal washings and aspirates are unacceptable for Xpert Xpress  SARS-CoV-2/FLU/RSV testing.  Fact Sheet for Patients: BloggerCourse.comhttps://www.fda.gov/media/152166/download  Fact Sheet for Healthcare Providers: SeriousBroker.ithttps://www.fda.gov/media/152162/download  This test is not yet approved or cleared by the [place] FDA and has been authorized for detection and/or diagnosis of SARS-CoV-2 by FDA under an Emergency Use Authorization (EUA). This EUA will remain in effect (meaning this test can be used) for the duration of the COVID-19 declaration under Section 564(b)(1) of the Act, 21 U.S.C. section 360bbb-3(b)(1), unless the authorization is terminated or revoked.  Performed at [facility], [address] [place], [place] [address]   Surgical pcr screen     Status: None   Collection Time: 01/19/21  6:21 PM   Specimen: Nasal Mucosa; Nasal Swab  Result Value Ref Range Status   MRSA, PCR NEGATIVE NEGATIVE Final   Staphylococcus aureus NEGATIVE NEGATIVE Final    Comment: (NOTE) The Xpert SA Assay (FDA approved for NASAL specimens in patients 73 years of age and older), is one component of a comprehensive surveillance program. It is not intended to diagnose infection nor to guide or monitor treatment. Performed at [facility], [address] [place], [place] [address]   Culture, blood (Routine X 2) w Reflex to ID Panel     Status: None   Collection Time: 01/21/21  1:34 PM   Specimen: BLOOD LEFT ARM  Result Value Ref Range Status   Specimen Description BLOOD LEFT ARM  Final   Special Requests   Final    BOTTLES DRAWN AEROBIC AND ANAEROBIC Blood Culture adequate volume   Culture   Final    NO GROWTH 5 DAYS Performed at [facility], [address] [place], [place] [address]    Report Status 01/26/2021 FINAL  Final  Culture, blood (Routine X 2) w Reflex to ID Panel     Status: None   Collection Time: 01/21/21  1:42 PM   Specimen: BLOOD LEFT ARM  Result Value Ref Range Status   Specimen Description BLOOD LEFT ARM  Final   Special  Requests   Final    BOTTLES DRAWN AEROBIC ONLY Blood Culture results may not be optimal due to an inadequate volume  of blood received in culture bottles   Culture   Final    NO GROWTH 5 DAYS Performed at [facility], [address], [place], [place] [address]    Report Status 01/26/2021 FINAL  Final  Resp Panel by RT-PCR (Flu A&B, Covid) Nasopharyngeal Swab     Status: None   Collection Time: 01/24/21  3:39 PM   Specimen: Nasopharyngeal Swab; Nasopharyngeal(NP) swabs in vial transport medium  Result Value Ref Range Status   SARS Coronavirus 2 by RT PCR NEGATIVE NEGATIVE Final    Comment: (NOTE) SARS-CoV-2 target nucleic acids are NOT DETECTED.  The SARS-CoV-2 RNA is generally detectable in upper respiratory specimens during the acute phase of infection. The lowest concentration of SARS-CoV-2 viral copies this assay can detect is 138 copies/mL. A negative result does not preclude SARS-Cov-2 infection and should not be used as the sole basis for treatment or other patient management decisions. A negative result may occur with  improper specimen collection/handling, submission of specimen other than nasopharyngeal swab, presence of viral mutation(s) within the areas targeted by this assay, and inadequate number of viral copies(<138 copies/mL). A negative result must be combined with clinical observations, patient history, and epidemiological information. The expected result is Negative.  Fact Sheet for Patients:  BloggerCourse.com  Fact Sheet for Healthcare Providers:  SeriousBroker.it  This test is no t yet approved or cleared by the [place] FDA and  has been authorized for detection and/or diagnosis of SARS-CoV-2 by FDA under an Emergency Use Authorization (EUA). This EUA will remain  in effect (meaning this test can be used) for the duration of the COVID-19 declaration under Section 564(b)(1) of the Act,  21 U.S.C.section 360bbb-3(b)(1), unless the authorization is terminated  or revoked sooner.       Influenza A by PCR NEGATIVE NEGATIVE Final   Influenza B by PCR NEGATIVE NEGATIVE Final    Comment: (NOTE) The Xpert Xpress SARS-CoV-2/FLU/RSV plus assay is intended as an aid in the diagnosis of influenza from Nasopharyngeal swab specimens and should not be used as a sole basis for treatment. Nasal washings and aspirates are unacceptable for Xpert Xpress SARS-CoV-2/FLU/RSV testing.  Fact Sheet for Patients: BloggerCourse.com  Fact Sheet for Healthcare Providers: SeriousBroker.it  This test is not yet approved or cleared by the [place] FDA and has been authorized for detection and/or diagnosis of SARS-CoV-2 by FDA under an Emergency Use Authorization (EUA). This EUA will remain in effect (meaning this test can be used) for the duration of the COVID-19 declaration under Section 564(b)(1) of the Act, 21 U.S.C. section 360bbb-3(b)(1), unless the authorization is terminated or revoked.  Performed at [facility], [address] [place], [place] [address]     Radiology Reports DG Chest 1 View  Result Date: 01/19/2021 CLINICAL DATA:  Preoperative exam.  Fall. EXAM: CHEST  1 VIEW COMPARISON:  No recent prior. FINDINGS: The heart size and mediastinal contours are within normal limits. Both lungs are clear. No acute bony abnormality. Degenerative changes scoliosis thoracic spine. IMPRESSION: No active disease. Electronically Signed   By: Maisie Fus  Register   On: 01/19/2021 07:36   CT HEAD WO CONTRAST  Result Date: 01/19/2021 CLINICAL DATA:  75 year old female with history of trauma from a fall with injury to the head. EXAM: CT HEAD WITHOUT CONTRAST CT CERVICAL SPINE WITHOUT CONTRAST TECHNIQUE: Multidetector CT imaging of the head and cervical spine was performed following the standard protocol without intravenous contrast.  Multiplanar CT image reconstructions of  the cervical spine were also generated. COMPARISON:  No priors. FINDINGS: CT HEAD FINDINGS Brain: Very mild cerebral atrophy. Patchy areas of mild decreased attenuation are noted throughout the periventricular white matter of the cerebral hemispheres bilaterally, compatible with chronic microvascular ischemic disease. No evidence of acute infarction, hemorrhage, hydrocephalus, extra-axial collection or mass lesion/mass effect. Vascular: No hyperdense vessel or unexpected calcification. Skull: Normal. Negative for fracture or focal lesion. Sinuses/Orbits: No acute finding. Other: None. CT CERVICAL SPINE FINDINGS Alignment: Normal. Skull base and vertebrae: No acute fracture. No primary bone lesion or focal pathologic process. Soft tissues and spinal canal: No prevertebral fluid or swelling. No visible canal hematoma. Disc levels: Multilevel degenerative disc disease, most pronounced at C6-C7 and C7-T1. Mild multilevel facet arthropathy. Upper chest: Unremarkable. Other: None. IMPRESSION: 1. No evidence of significant acute traumatic injury to the skull, brain or cervical spine. 2. Very mild cerebral atrophy and chronic microvascular ischemic changes in the periventricular white matter of the cerebral hemispheres. 3. Multilevel degenerative disc disease and cervical spondylosis, as above. Electronically Signed   By: Trudie Reed M.D.   On: 01/19/2021 07:48   CT CERVICAL SPINE WO CONTRAST  Result Date: 01/19/2021 CLINICAL DATA:  75 year old female with history of trauma from a fall with injury to the head. EXAM: CT HEAD WITHOUT CONTRAST CT CERVICAL SPINE WITHOUT CONTRAST TECHNIQUE: Multidetector CT imaging of the head and cervical spine was performed following the standard protocol without intravenous contrast. Multiplanar CT image reconstructions of the cervical spine were also generated. COMPARISON:  No priors. FINDINGS: CT HEAD FINDINGS Brain: Very mild cerebral  atrophy. Patchy areas of mild decreased attenuation are noted throughout the periventricular white matter of the cerebral hemispheres bilaterally, compatible with chronic microvascular ischemic disease. No evidence of acute infarction, hemorrhage, hydrocephalus, extra-axial collection or mass lesion/mass effect. Vascular: No hyperdense vessel or unexpected calcification. Skull: Normal. Negative for fracture or focal lesion. Sinuses/Orbits: No acute finding. Other: None. CT CERVICAL SPINE FINDINGS Alignment: Normal. Skull base and vertebrae: No acute fracture. No primary bone lesion or focal pathologic process. Soft tissues and spinal canal: No prevertebral fluid or swelling. No visible canal hematoma. Disc levels: Multilevel degenerative disc disease, most pronounced at C6-C7 and C7-T1. Mild multilevel facet arthropathy. Upper chest: Unremarkable. Other: None. IMPRESSION: 1. No evidence of significant acute traumatic injury to the skull, brain or cervical spine. 2. Very mild cerebral atrophy and chronic microvascular ischemic changes in the periventricular white matter of the cerebral hemispheres. 3. Multilevel degenerative disc disease and cervical spondylosis, as above. Electronically Signed   By: Trudie Reed M.D.   On: 01/19/2021 07:48   Pelvis Portable  Result Date: 01/19/2021 CLINICAL DATA:  Postop left hip EXAM: PORTABLE PELVIS 1-2 VIEWS COMPARISON:  01/19/2021 FINDINGS: Interval left hip replacement with intact hardware and normal alignment. Gas in the soft tissues consistent with recent surgery. IMPRESSION: Interval left hip replacement with expected postsurgical change Electronically Signed   By: Jasmine Pang M.D.   On: 01/19/2021 23:32   DG Chest Port 1 View  Result Date: 01/21/2021 CLINICAL DATA:  Fever EXAM: PORTABLE CHEST 1 VIEW COMPARISON:  Portable exam 1411 hours compared to 01/19/2021 FINDINGS: Upper normal heart size. Mediastinal contours and pulmonary vascularity normal.  Atherosclerotic calcification aorta. Minimal bibasilar atelectasis. Lungs otherwise clear. No infiltrate, pleural effusion, or pneumothorax. Bones demineralized. IMPRESSION: Minimal bibasilar atelectasis. Aortic Atherosclerosis (ICD10-I70.0). Electronically Signed   By: Ulyses Southward M.D.   On: 01/21/2021 14:26   DG Shoulder Left Port  Result Date:  01/24/2021 CLINICAL DATA:  Pain.  Recent fall EXAM: LEFT SHOULDER: 3 V COMPARISON:  None. FINDINGS: Frontal, oblique, and Y scapular images obtained. No acute fracture or dislocation. There is moderate narrowing of the acromioclavicular joint with milder narrowing of the glenohumeral joint. No erosive change. There is bony overgrowth along the inferior acromioclavicular joint. Visualized left lung clear. 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 Bony overgrowth along the inferior acromioclavicular joint potentially places patient at increased risk for impingement syndrome. No acute fracture or dislocation. Electronically Signed   By: Bretta Bang III M.D.   On: 01/24/2021 08:18   DG Shoulder Right Port  Result Date: 01/24/2021 CLINICAL DATA:  Pain following recent trauma EXAM: PORTABLE RIGHT SHOULDER: 3 V COMPARISON:  None. FINDINGS: Frontal, oblique, and Y scapular images were obtained. No fracture or dislocation. There is mild generalized joint space narrowing. No erosive change. Visualized right lung clear. IMPRESSION: Mild generalized osteoarthritic change.  No fracture or dislocation. Electronically Signed   By: Bretta Bang III M.D.   On: 01/24/2021 08:19   DG C-Arm 1-60 Min  Result Date: 01/19/2021 CLINICAL DATA:  Anterior left hip arthroplasty EXAM: DG C-ARM 1-60 MIN; DG HIP (WITH OR WITHOUT PELVIS) 2-3V LEFT FLUOROSCOPY TIME:  Fluoroscopy Time:  33 seconds Radiation Exposure Index (if provided by the fluoroscopic device): 1.69 mGy Number of Acquired Spot Images: 3 COMPARISON:  01/19/2021 FINDINGS: Three  fluoroscopic images are obtained during the performance of the procedure and are provided for interpretation only. A left hip arthroplasty is identified in the expected position without signs of complication. IMPRESSION: 1. Unremarkable left hip arthroplasty. Please refer to the operative report. Electronically Signed   By: Sharlet Salina M.D.   On: 01/19/2021 22:14   DG HIP UNILAT WITH PELVIS 2-3 VIEWS LEFT  Result Date: 01/19/2021 CLINICAL DATA:  Anterior left hip arthroplasty EXAM: DG C-ARM 1-60 MIN; DG HIP (WITH OR WITHOUT PELVIS) 2-3V LEFT FLUOROSCOPY TIME:  Fluoroscopy Time:  33 seconds Radiation Exposure Index (if provided by the fluoroscopic device): 1.69 mGy Number of Acquired Spot Images: 3 COMPARISON:  01/19/2021 FINDINGS: Three fluoroscopic images are obtained during the performance of the procedure and are provided for interpretation only. A left hip arthroplasty is identified in the expected position without signs of complication. IMPRESSION: 1. Unremarkable left hip arthroplasty. Please refer to the operative report. Electronically Signed   By: Sharlet Salina M.D.   On: 01/19/2021 22:14   DG Hip Unilat With Pelvis 2-3 Views Left  Result Date: 01/19/2021 CLINICAL DATA:  75 year old female with left side hip pain and rotation. Possible fracture. Preoperative exam. EXAM: DG HIP (WITH OR WITHOUT PELVIS) 2-3V LEFT COMPARISON:  None. FINDINGS: Left femoral head is normally located. But a left femoral neck fracture is visible on image 3 with mild impaction. The left intertrochanteric segment and proximal left femoral shaft remain intact. No pelvis fracture. SI joints appear normal. Grossly intact proximal right femur. Negative visible bowel gas pattern.  Incidental pelvic phleboliths. IMPRESSION: Positive for left femoral neck fracture with mild impaction. Electronically Signed   By: Odessa Fleming M.D.   On: 01/19/2021 07:43    Lab Data:  CBC: Recent Labs  Lab 01/20/21 0232 01/21/21 1334  01/23/21 0211  WBC 8.0 4.7 3.6*  HGB 13.3 11.8* 10.3*  HCT 38.6 35.0* 30.4*  MCV 88.3 90.0 89.4  PLT 104* 97* 105*   Basic Metabolic Panel: Recent Labs  Lab 01/20/21 0232 01/23/21 0211  NA 135  134*  K 4.0 4.6  CL 101 99  CO2 23 29  GLUCOSE 122* 113*  BUN 10 19  CREATININE 0.71 0.83  CALCIUM 8.7* 8.6*   GFR: Estimated Creatinine Clearance: 72.1 mL/min (by C-G formula based on SCr of 0.83 mg/dL). Liver Function Tests: No results for input(s): AST, ALT, ALKPHOS, BILITOT, PROT, ALBUMIN in the last 168 hours. No results for input(s): LIPASE, AMYLASE in the last 168 hours. No results for input(s): AMMONIA in the last 168 hours. Coagulation Profile: No results for input(s): INR, PROTIME in the last 168 hours. Cardiac Enzymes: No results for input(s): CKTOTAL, CKMB, CKMBINDEX, TROPONINI in the last 168 hours. BNP (last 3 results) No results for input(s): PROBNP in the last 8760 hours. HbA1C: No results for input(s): HGBA1C in the last 72 hours. CBG: Recent Labs  Lab 01/20/21 1139  GLUCAP 136*   Lipid Profile: No results for input(s): CHOL, HDL, LDLCALC, TRIG, CHOLHDL, LDLDIRECT in the last 72 hours. Thyroid Function Tests: No results for input(s): TSH, T4TOTAL, FREET4, T3FREE, THYROIDAB in the last 72 hours. Anemia Panel: No results for input(s): VITAMINB12, FOLATE, FERRITIN, TIBC, IRON, RETICCTPCT in the last 72 hours. Urine analysis:    Component Value Date/Time   COLORURINE YELLOW 01/19/2021 1001   APPEARANCEUR CLEAR 01/19/2021 1001   LABSPEC 1.013 01/19/2021 1001   PHURINE 6.0 01/19/2021 1001   GLUCOSEU NEGATIVE 01/19/2021 1001   HGBUR NEGATIVE 01/19/2021 1001   HGBUR negative 02/01/2008 1038   BILIRUBINUR NEGATIVE 01/19/2021 1001   KETONESUR NEGATIVE 01/19/2021 1001   PROTEINUR NEGATIVE 01/19/2021 1001   UROBILINOGEN 1.0 08/14/2008 0851   NITRITE NEGATIVE 01/19/2021 1001   LEUKOCYTESUR TRACE (A) 01/19/2021 1001     Melanie Pellot M.D. [facility] 01/26/2021, 3:01 PM  Available via Epic secure chat 7am-7pm After 7 pm, please refer to night coverage provider listed on amion.

## 2021-01-27 LAB — CBC
HCT: 30.8 % — ABNORMAL LOW (ref 36.0–46.0)
Hemoglobin: 10.3 g/dL — ABNORMAL LOW (ref 12.0–15.0)
MCH: 30.1 pg (ref 26.0–34.0)
MCHC: 33.4 g/dL (ref 30.0–36.0)
MCV: 90.1 fL (ref 80.0–100.0)
Platelets: 164 10*3/uL (ref 150–400)
RBC: 3.42 MIL/uL — ABNORMAL LOW (ref 3.87–5.11)
RDW: 13.7 % (ref 11.5–15.5)
WBC: 3.6 10*3/uL — ABNORMAL LOW (ref 4.0–10.5)
nRBC: 0 % (ref 0.0–0.2)

## 2021-01-27 LAB — BASIC METABOLIC PANEL
Anion gap: 7 (ref 5–15)
BUN: 18 mg/dL (ref 8–23)
CO2: 25 mmol/L (ref 22–32)
Calcium: 8.6 mg/dL — ABNORMAL LOW (ref 8.9–10.3)
Chloride: 104 mmol/L (ref 98–111)
Creatinine, Ser: 0.71 mg/dL (ref 0.44–1.00)
GFR, Estimated: >60 mL/min (ref >60)
Glucose, Bld: 104 mg/dL — ABNORMAL HIGH (ref 70–99)
Potassium: 3.9 mmol/L (ref 3.5–5.1)
Sodium: 136 mmol/L (ref 135–145)

## 2021-01-27 LAB — RESP PANEL BY RT-PCR (FLU A&B, COVID) ARPGX2
Influenza A by PCR: NEGATIVE
Influenza B by PCR: NEGATIVE
SARS Coronavirus 2 by RT PCR: NEGATIVE

## 2021-01-27 MED ORDER — ENSURE ENLIVE PO LIQD: 237.0000 mL | Freq: Two times a day (BID) | ORAL | Status: DC

## 2021-01-27 MED ORDER — ADULT MULTIVITAMIN W/MINERALS CH: 1.0000 | ORAL_TABLET | Freq: Every day | ORAL | Status: DC

## 2021-01-27 MED ORDER — DOCUSATE SODIUM 100 MG PO CAPS: 100.0000 mg | ORAL_CAPSULE | Freq: Two times a day (BID) | ORAL | Status: DC

## 2021-01-27 NOTE — TOC Progression Note (Signed)
Transition of Care [facility]) - Progression Note    Patient Details  Name: LAQUIESHA PIACENTE MRN: [id number] Date of Birth: [birth year]  [facility] CM/SW Contact  Ralene Bathe, LCSWA Phone Number: 01/27/2021, 10:35 AM  Clinical Narrative:     CSW received a call from [place] with [facility] stating that the facility is expecting to receive insurance approval today. The patient can possibly d/c today to the facility today pending an updated COVID test.    The patient's god daughter, Glee Arvin, was informed.   Expected Discharge Plan: Skilled Nursing Facility Barriers to Discharge: Continued Medical Work up,Insurance Authorization,SNF Pending bed offer  Expected Discharge Plan and Services Expected Discharge Plan: Skilled Nursing Facility       Living arrangements for the past 2 months: Assisted Living Facility                                       Social Determinants of Health (SDOH) Interventions    Readmission Risk Interventions No flowsheet data found.

## 2021-01-27 NOTE — Discharge Summary (Signed)
Physician Discharge Summary  Elizabeth Tucker ZOX:[id number] DOB: September 05, 1945 DOA: 01/19/2021  PCP: Elizabeth Bookman, NP  Admit date: 01/19/2021 Discharge date: 01/27/2021  Admitted From: Home Disposition: SNF  Recommendations for Outpatient Follow-up:  1. Follow up with PCP in 1 week 2. Follow up with orthopedic surgery in 2 weeks 3. Please follow up on the following pending results: None   Discharge Condition: Stable CODE STATUS: Full code Diet recommendation: Heart healthy   Brief/Interim Summary:  Admission HPI written by Elizabeth Blue, MD   HPI: Elizabeth Tucker is a 75 y.o. female with medical history significant of dementia presenting with an unwitnessed fall.  The patient lives in ALF.  She is currently oriented x 3 but is unable to provide history of the event, thinks she is in the hospital for a physical.  Unable to reach family, message left on work voice mail.   Hospital course:  Left hip fracture Secondary to fall. No reported syncope. Orthopedic surgery consulted. Patient underwent left total hip arthroplasty on 6/1. Orthopedic surgery recommendations for aspirin BID for DVT prophylaxis, weight bearing as tolerated. PT/OT recommending SNF on discharge.  Fever Tmax of 101.1 on 6/2. No source identified. Asymptomatic. No recurrent fevers. Resolved.  Dementia with behavioral disturbance Continue previously prescribed Geodon, trazodone, ativan. Continue previously prescribed haldol.  Recurrent fall Fall occurred while hospitalized. Unwitnessed fall. Bilateral shoulder x-rays were obtained and were significant for no fractures  Obesity Body mass index is 36.94 kg/m.    Discharge Diagnoses:  Principal Problem:   Closed left hip fracture, initial encounter [facility]) Active Problems:   Dementia with behavioral disturbance (HCC)   Class 2 obesity due to excess calories with body mass index (BMI) of 36.0 to 36.9 in adult    Discharge Instructions   Allergies  as of 01/27/2021      Reactions   Sulfonamide Derivatives Other (See Comments)   Childhood allergy       Medication List    STOP taking these medications   loperamide 2 MG capsule Commonly known as: IMODIUM     TAKE these medications   acetaminophen 500 MG tablet Commonly known as: TYLENOL Take 500 mg by mouth every 6 (six) hours as needed for moderate pain, fever or headache.   aluminum-magnesium hydroxide-simethicone 200-200-20 MG/5ML Susp Commonly known as: MAALOX Take 30 mLs by mouth every 6 (six) hours as needed (heartburn/indigestion).   aspirin 81 MG chewable tablet Chew 1 tablet (81 mg total) by mouth 2 (two) times daily after a meal. Take for 2 weeks only.   docusate sodium 100 MG capsule Commonly known as: COLACE Take 1 capsule (100 mg total) by mouth 2 (two) times daily.   feeding supplement Liqd Take 237 mLs by mouth 2 (two) times daily between meals.   guaifenesin 100 MG/5ML syrup Commonly known as: ROBITUSSIN Take 200 mg by mouth every 6 (six) hours as needed for cough.   haloperidol 5 MG tablet Commonly known as: HALDOL Take 1 tablet (5 mg total) by mouth every 6 (six) hours as needed for agitation.   HYDROcodone-acetaminophen 5-325 MG tablet Commonly known as: NORCO/VICODIN Take 1 tablet by mouth every 6 (six) hours as needed for moderate pain (pain score 4-6).   LORazepam 0.5 MG tablet Commonly known as: ATIVAN Take 1 tablet (0.5 mg total) by mouth 2 (two) times daily.   LORazepam 1 MG tablet Commonly known as: ATIVAN Take 1 tablet (1 mg total) by mouth every 6 (six) hours as  needed for anxiety.   magnesium hydroxide 400 MG/5ML suspension Commonly known as: MILK OF MAGNESIA Take 30 mLs by mouth at bedtime as needed for mild constipation.   multivitamin with minerals Tabs tablet Take 1 tablet by mouth daily. Start taking on: January 28, 2021   omeprazole 40 MG capsule Commonly known as: PRILOSEC Take 40 mg by mouth daily.   traZODone 50 MG  tablet Commonly known as: DESYREL Take 50 mg by mouth at bedtime.   Triple Antibiotic [phone] Oint Apply 1 application topically as needed (skin tears/abraisons).   ziprasidone 40 MG capsule Commonly known as: GEODON Take 40 mg by mouth daily.       Follow-up Information    Kathryne Hitch, MD. Schedule an appointment as soon as possible for a visit in 2 week(s).   Specialty: Orthopedic Surgery Contact information: [address] [place] [address] [phone]              Allergies  Allergen Reactions  . Sulfonamide Derivatives Other (See Comments)    Childhood allergy     Consultations:  Orthopedic surgery   Procedures/Studies: DG Chest 1 View  Result Date: 01/19/2021 CLINICAL DATA:  Preoperative exam.  Fall. EXAM: CHEST  1 VIEW COMPARISON:  No recent prior. FINDINGS: The heart size and mediastinal contours are within normal limits. Both lungs are clear. No acute bony abnormality. Degenerative changes scoliosis thoracic spine. IMPRESSION: No active disease. Electronically Signed   By: Maisie Fus  Register   On: 01/19/2021 07:36   CT HEAD WO CONTRAST  Result Date: 01/19/2021 CLINICAL DATA:  75 year old female with history of trauma from a fall with injury to the head. EXAM: CT HEAD WITHOUT CONTRAST CT CERVICAL SPINE WITHOUT CONTRAST TECHNIQUE: Multidetector CT imaging of the head and cervical spine was performed following the standard protocol without intravenous contrast. Multiplanar CT image reconstructions of the cervical spine were also generated. COMPARISON:  No priors. FINDINGS: CT HEAD FINDINGS Brain: Very mild cerebral atrophy. Patchy areas of mild decreased attenuation are noted throughout the periventricular white matter of the cerebral hemispheres bilaterally, compatible with chronic microvascular ischemic disease. No evidence of acute infarction, hemorrhage, hydrocephalus, extra-axial collection or mass lesion/mass effect. Vascular: No  hyperdense vessel or unexpected calcification. Skull: Normal. Negative for fracture or focal lesion. Sinuses/Orbits: No acute finding. Other: None. CT CERVICAL SPINE FINDINGS Alignment: Normal. Skull base and vertebrae: No acute fracture. No primary bone lesion or focal pathologic process. Soft tissues and spinal canal: No prevertebral fluid or swelling. No visible canal hematoma. Disc levels: Multilevel degenerative disc disease, most pronounced at C6-C7 and C7-T1. Mild multilevel facet arthropathy. Upper chest: Unremarkable. Other: None. IMPRESSION: 1. No evidence of significant acute traumatic injury to the skull, brain or cervical spine. 2. Very mild cerebral atrophy and chronic microvascular ischemic changes in the periventricular white matter of the cerebral hemispheres. 3. Multilevel degenerative disc disease and cervical spondylosis, as above. Electronically Signed   By: Trudie Reed M.D.   On: 01/19/2021 07:48   CT CERVICAL SPINE WO CONTRAST  Result Date: 01/19/2021 CLINICAL DATA:  75 year old female with history of trauma from a fall with injury to the head. EXAM: CT HEAD WITHOUT CONTRAST CT CERVICAL SPINE WITHOUT CONTRAST TECHNIQUE: Multidetector CT imaging of the head and cervical spine was performed following the standard protocol without intravenous contrast. Multiplanar CT image reconstructions of the cervical spine were also generated. COMPARISON:  No priors. FINDINGS: CT HEAD FINDINGS Brain: Very mild cerebral atrophy. Patchy areas of mild decreased attenuation  are noted throughout the periventricular white matter of the cerebral hemispheres bilaterally, compatible with chronic microvascular ischemic disease. No evidence of acute infarction, hemorrhage, hydrocephalus, extra-axial collection or mass lesion/mass effect. Vascular: No hyperdense vessel or unexpected calcification. Skull: Normal. Negative for fracture or focal lesion. Sinuses/Orbits: No acute finding. Other: None. CT CERVICAL  SPINE FINDINGS Alignment: Normal. Skull base and vertebrae: No acute fracture. No primary bone lesion or focal pathologic process. Soft tissues and spinal canal: No prevertebral fluid or swelling. No visible canal hematoma. Disc levels: Multilevel degenerative disc disease, most pronounced at C6-C7 and C7-T1. Mild multilevel facet arthropathy. Upper chest: Unremarkable. Other: None. IMPRESSION: 1. No evidence of significant acute traumatic injury to the skull, brain or cervical spine. 2. Very mild cerebral atrophy and chronic microvascular ischemic changes in the periventricular white matter of the cerebral hemispheres. 3. Multilevel degenerative disc disease and cervical spondylosis, as above. Electronically Signed   By: Trudie Reed M.D.   On: 01/19/2021 07:48   Pelvis Portable  Result Date: 01/19/2021 CLINICAL DATA:  Postop left hip EXAM: PORTABLE PELVIS 1-2 VIEWS COMPARISON:  01/19/2021 FINDINGS: Interval left hip replacement with intact hardware and normal alignment. Gas in the soft tissues consistent with recent surgery. IMPRESSION: Interval left hip replacement with expected postsurgical change Electronically Signed   By: Jasmine Pang M.D.   On: 01/19/2021 23:32   DG Chest Port 1 View  Result Date: 01/21/2021 CLINICAL DATA:  Fever EXAM: PORTABLE CHEST 1 VIEW COMPARISON:  Portable exam 1411 hours compared to 01/19/2021 FINDINGS: Upper normal heart size. Mediastinal contours and pulmonary vascularity normal. Atherosclerotic calcification aorta. Minimal bibasilar atelectasis. Lungs otherwise clear. No infiltrate, pleural effusion, or pneumothorax. Bones demineralized. IMPRESSION: Minimal bibasilar atelectasis. Aortic Atherosclerosis (ICD10-I70.0). Electronically Signed   By: Ulyses Southward M.D.   On: 01/21/2021 14:26   DG Shoulder Left Port  Result Date: 01/24/2021 CLINICAL DATA:  Pain.  Recent fall EXAM: LEFT SHOULDER: 3 V COMPARISON:  None. FINDINGS: Frontal, oblique, and Y scapular images  obtained. No acute fracture or dislocation. There is moderate narrowing of the acromioclavicular joint with milder narrowing of the glenohumeral joint. No erosive change. There is bony overgrowth along the inferior acromioclavicular joint. Visualized left lung clear. 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 Bony overgrowth along the inferior acromioclavicular joint potentially places patient at increased risk for impingement syndrome. No acute fracture or dislocation. Electronically Signed   By: Bretta Bang III M.D.   On: 01/24/2021 08:18   DG Shoulder Right Port  Result Date: 01/24/2021 CLINICAL DATA:  Pain following recent trauma EXAM: PORTABLE RIGHT SHOULDER: 3 V COMPARISON:  None. FINDINGS: Frontal, oblique, and Y scapular images were obtained. No fracture or dislocation. There is mild generalized joint space narrowing. No erosive change. Visualized right lung clear. IMPRESSION: Mild generalized osteoarthritic change.  No fracture or dislocation. Electronically Signed   By: Bretta Bang III M.D.   On: 01/24/2021 08:19   DG C-Arm 1-60 Min  Result Date: 01/19/2021 CLINICAL DATA:  Anterior left hip arthroplasty EXAM: DG C-ARM 1-60 MIN; DG HIP (WITH OR WITHOUT PELVIS) 2-3V LEFT FLUOROSCOPY TIME:  Fluoroscopy Time:  33 seconds Radiation Exposure Index (if provided by the fluoroscopic device): 1.69 mGy Number of Acquired Spot Images: 3 COMPARISON:  01/19/2021 FINDINGS: Three fluoroscopic images are obtained during the performance of the procedure and are provided for interpretation only. A left hip arthroplasty is identified in the expected position without signs of complication. IMPRESSION: 1. Unremarkable left hip arthroplasty.  Please refer to the operative report. Electronically Signed   By: Sharlet Salina M.D.   On: 01/19/2021 22:14   DG HIP UNILAT WITH PELVIS 2-3 VIEWS LEFT  Result Date: 01/19/2021 CLINICAL DATA:  Anterior left hip arthroplasty EXAM: DG  C-ARM 1-60 MIN; DG HIP (WITH OR WITHOUT PELVIS) 2-3V LEFT FLUOROSCOPY TIME:  Fluoroscopy Time:  33 seconds Radiation Exposure Index (if provided by the fluoroscopic device): 1.69 mGy Number of Acquired Spot Images: 3 COMPARISON:  01/19/2021 FINDINGS: Three fluoroscopic images are obtained during the performance of the procedure and are provided for interpretation only. A left hip arthroplasty is identified in the expected position without signs of complication. IMPRESSION: 1. Unremarkable left hip arthroplasty. Please refer to the operative report. Electronically Signed   By: Sharlet Salina M.D.   On: 01/19/2021 22:14   DG Hip Unilat With Pelvis 2-3 Views Left  Result Date: 01/19/2021 CLINICAL DATA:  74 year old female with left side hip pain and rotation. Possible fracture. Preoperative exam. EXAM: DG HIP (WITH OR WITHOUT PELVIS) 2-3V LEFT COMPARISON:  None. FINDINGS: Left femoral head is normally located. But a left femoral neck fracture is visible on image 3 with mild impaction. The left intertrochanteric segment and proximal left femoral shaft remain intact. No pelvis fracture. SI joints appear normal. Grossly intact proximal right femur. Negative visible bowel gas pattern.  Incidental pelvic phleboliths. IMPRESSION: Positive for left femoral neck fracture with mild impaction. Electronically Signed   By: Odessa Fleming M.D.   On: 01/19/2021 07:43      Subjective: No issues overnight.  Discharge Exam: Vitals:   01/26/21 2100 01/27/21 0444  BP: 107/60 115/74  Pulse: 92 88  Resp: 16 18  Temp: 98.5 F (36.9 C) 98 F (36.7 C)  SpO2: 97% 100%   Vitals:   01/26/21 0820 01/26/21 1532 01/26/21 2100 01/27/21 0444  BP: (!) 91/54 (!) 108/55 107/60 115/74  Pulse: 90 82 92 88  Resp: 17 17 16 18   Temp: 99.2 F (37.3 C) 98.2 F (36.8 C) 98.5 F (36.9 C) 98 F (36.7 C)  TempSrc: Oral Oral Oral Oral  SpO2: 99% 98% 97% 100%  Weight:      Height:        General: Pt is alert, awake, not in acute  distress Cardiovascular: RRR, S1/S2 +, no rubs, no gallops Respiratory: CTA bilaterally, no wheezing, no rhonchi Abdominal: Soft, NT, ND, bowel sounds + Extremities: no edema, no cyanosis    The results of significant diagnostics from this hospitalization (including imaging, microbiology, ancillary and laboratory) are listed below for reference.     Microbiology: Recent Results (from the past 240 hour(s))  Resp Panel by RT-PCR (Flu A&B, Covid) Nasopharyngeal Swab     Status: None   Collection Time: 01/19/21  7:38 AM   Specimen: Nasopharyngeal Swab; Nasopharyngeal(NP) swabs in vial transport medium  Result Value Ref Range Status   SARS Coronavirus 2 by RT PCR NEGATIVE NEGATIVE Final    Comment: (NOTE) SARS-CoV-2 target nucleic acids are NOT DETECTED.  The SARS-CoV-2 RNA is generally detectable in upper respiratory specimens during the acute phase of infection. The lowest concentration of SARS-CoV-2 viral copies this assay can detect is 138 copies/mL. A negative result does not preclude SARS-Cov-2 infection and should not be used as the sole basis for treatment or other patient management decisions. A negative result may occur with  improper specimen collection/handling, submission of specimen other than nasopharyngeal swab, presence of viral mutation(s) within the areas targeted  by this assay, and inadequate number of viral copies(<138 copies/mL). A negative result must be combined with clinical observations, patient history, and epidemiological information. The expected result is Negative.  Fact Sheet for Patients:  BloggerCourse.com  Fact Sheet for Healthcare Providers:  SeriousBroker.it  This test is no t yet approved or cleared by the [place] FDA and  has been authorized for detection and/or diagnosis of SARS-CoV-2 by FDA under an Emergency Use Authorization (EUA). This EUA will remain  in effect (meaning this test  can be used) for the duration of the COVID-19 declaration under Section 564(b)(1) of the Act, 21 U.S.C.section 360bbb-3(b)(1), unless the authorization is terminated  or revoked sooner.       Influenza A by PCR NEGATIVE NEGATIVE Final   Influenza B by PCR NEGATIVE NEGATIVE Final    Comment: (NOTE) The Xpert Xpress SARS-CoV-2/FLU/RSV plus assay is intended as an aid in the diagnosis of influenza from Nasopharyngeal swab specimens and should not be used as a sole basis for treatment. Nasal washings and aspirates are unacceptable for Xpert Xpress SARS-CoV-2/FLU/RSV testing.  Fact Sheet for Patients: BloggerCourse.com  Fact Sheet for Healthcare Providers: SeriousBroker.it  This test is not yet approved or cleared by the [place] FDA and has been authorized for detection and/or diagnosis of SARS-CoV-2 by FDA under an Emergency Use Authorization (EUA). This EUA will remain in effect (meaning this test can be used) for the duration of the COVID-19 declaration under Section 564(b)(1) of the Act, 21 U.S.C. section 360bbb-3(b)(1), unless the authorization is terminated or revoked.  Performed at [facility], [address] [place], [place] [address]   Surgical pcr screen     Status: None   Collection Time: 01/19/21  6:21 PM   Specimen: Nasal Mucosa; Nasal Swab  Result Value Ref Range Status   MRSA, PCR NEGATIVE NEGATIVE Final   Staphylococcus aureus NEGATIVE NEGATIVE Final    Comment: (NOTE) The Xpert SA Assay (FDA approved for NASAL specimens in patients 80 years of age and older), is one component of a comprehensive surveillance program. It is not intended to diagnose infection nor to guide or monitor treatment. Performed at [facility], [address] [place], [place] [address]   Culture, blood (Routine X 2) w Reflex to ID Panel     Status: None   Collection Time: 01/21/21  1:34 PM   Specimen: BLOOD LEFT  ARM  Result Value Ref Range Status   Specimen Description BLOOD LEFT ARM  Final   Special Requests   Final    BOTTLES DRAWN AEROBIC AND ANAEROBIC Blood Culture adequate volume   Culture   Final    NO GROWTH 5 DAYS Performed at [facility], [address], [place], [place] [address]    Report Status 01/26/2021 FINAL  Final  Culture, blood (Routine X 2) w Reflex to ID Panel     Status: None   Collection Time: 01/21/21  1:42 PM   Specimen: BLOOD LEFT ARM  Result Value Ref Range Status   Specimen Description BLOOD LEFT ARM  Final   Special Requests   Final    BOTTLES DRAWN AEROBIC ONLY Blood Culture results may not be optimal due to an inadequate volume of blood received in culture bottles   Culture   Final    NO GROWTH 5 DAYS Performed at [facility], [address], [place], [place] [address]    Report Status 01/26/2021 FINAL  Final  Resp  Panel by RT-PCR (Flu A&B, Covid) Nasopharyngeal Swab     Status: None   Collection Time: 01/24/21  3:39 PM   Specimen: Nasopharyngeal Swab; Nasopharyngeal(NP) swabs in vial transport medium  Result Value Ref Range Status   SARS Coronavirus 2 by RT PCR NEGATIVE NEGATIVE Final    Comment: (NOTE) SARS-CoV-2 target nucleic acids are NOT DETECTED.  The SARS-CoV-2 RNA is generally detectable in upper respiratory specimens during the acute phase of infection. The lowest concentration of SARS-CoV-2 viral copies this assay can detect is 138 copies/mL. A negative result does not preclude SARS-Cov-2 infection and should not be used as the sole basis for treatment or other patient management decisions. A negative result may occur with  improper specimen collection/handling, submission of specimen other than nasopharyngeal swab, presence of viral mutation(s) within the areas targeted by this assay, and inadequate number of viral copies(<138 copies/mL). A negative result must be combined with clinical observations, patient history, and  epidemiological information. The expected result is Negative.  Fact Sheet for Patients:  BloggerCourse.comhttps://www.fda.gov/media/152166/download  Fact Sheet for Healthcare Providers:  SeriousBroker.ithttps://www.fda.gov/media/152162/download  This test is no t yet approved or cleared by the [place] FDA and  has been authorized for detection and/or diagnosis of SARS-CoV-2 by FDA under an Emergency Use Authorization (EUA). This EUA will remain  in effect (meaning this test can be used) for the duration of the COVID-19 declaration under Section 564(b)(1) of the Act, 21 U.S.C.section 360bbb-3(b)(1), unless the authorization is terminated  or revoked sooner.       Influenza A by PCR NEGATIVE NEGATIVE Final   Influenza B by PCR NEGATIVE NEGATIVE Final    Comment: (NOTE) The Xpert Xpress SARS-CoV-2/FLU/RSV plus assay is intended as an aid in the diagnosis of influenza from Nasopharyngeal swab specimens and should not be used as a sole basis for treatment. Nasal washings and aspirates are unacceptable for Xpert Xpress SARS-CoV-2/FLU/RSV testing.  Fact Sheet for Patients: BloggerCourse.comhttps://www.fda.gov/media/152166/download  Fact Sheet for Healthcare Providers: SeriousBroker.ithttps://www.fda.gov/media/152162/download  This test is not yet approved or cleared by the [place] FDA and has been authorized for detection and/or diagnosis of SARS-CoV-2 by FDA under an Emergency Use Authorization (EUA). This EUA will remain in effect (meaning this test can be used) for the duration of the COVID-19 declaration under Section 564(b)(1) of the Act, 21 U.S.C. section 360bbb-3(b)(1), unless the authorization is terminated or revoked.  Performed at [facility], [address] [place], [place] [address]   Resp Panel by RT-PCR (Flu A&B, Covid) Nasopharyngeal Swab     Status: None   Collection Time: 01/27/21 10:35 AM   Specimen: Nasopharyngeal Swab; Nasopharyngeal(NP) swabs in vial transport medium  Result Value Ref Range Status    SARS Coronavirus 2 by RT PCR NEGATIVE NEGATIVE Final    Comment: (NOTE) SARS-CoV-2 target nucleic acids are NOT DETECTED.  The SARS-CoV-2 RNA is generally detectable in upper respiratory specimens during the acute phase of infection. The lowest concentration of SARS-CoV-2 viral copies this assay can detect is 138 copies/mL. A negative result does not preclude SARS-Cov-2 infection and should not be used as the sole basis for treatment or other patient management decisions. A negative result may occur with  improper specimen collection/handling, submission of specimen other than nasopharyngeal swab, presence of viral mutation(s) within the areas targeted by this assay, and inadequate number of viral copies(<138 copies/mL). A negative result must be combined with clinical observations, patient history, and epidemiological information. The expected result is Negative.  Fact Sheet  for Patients:  BloggerCourse.com  Fact Sheet for Healthcare Providers:  SeriousBroker.it  This test is no t yet approved or cleared by the [place] FDA and  has been authorized for detection and/or diagnosis of SARS-CoV-2 by FDA under an Emergency Use Authorization (EUA). This EUA will remain  in effect (meaning this test can be used) for the duration of the COVID-19 declaration under Section 564(b)(1) of the Act, 21 U.S.C.section 360bbb-3(b)(1), unless the authorization is terminated  or revoked sooner.       Influenza A by PCR NEGATIVE NEGATIVE Final   Influenza B by PCR NEGATIVE NEGATIVE Final    Comment: (NOTE) The Xpert Xpress SARS-CoV-2/FLU/RSV plus assay is intended as an aid in the diagnosis of influenza from Nasopharyngeal swab specimens and should not be used as a sole basis for treatment. Nasal washings and aspirates are unacceptable for Xpert Xpress SARS-CoV-2/FLU/RSV testing.  Fact Sheet for  Patients: BloggerCourse.com  Fact Sheet for Healthcare Providers: SeriousBroker.it  This test is not yet approved or cleared by the [place] FDA and has been authorized for detection and/or diagnosis of SARS-CoV-2 by FDA under an Emergency Use Authorization (EUA). This EUA will remain in effect (meaning this test can be used) for the duration of the COVID-19 declaration under Section 564(b)(1) of the Act, 21 U.S.C. section 360bbb-3(b)(1), unless the authorization is terminated or revoked.  Performed at [facility], [address] [place], [place] [address]      Labs: BNP (last 3 results) No results for input(s): BNP in the last 8760 hours. Basic Metabolic Panel: Recent Labs  Lab 01/23/21 0211 01/27/21 0335  NA 134* 136  K 4.6 3.9  CL 99 104  CO2 29 25  GLUCOSE 113* 104*  BUN 19 18  CREATININE 0.83 0.71  CALCIUM 8.6* 8.6*   Liver Function Tests: No results for input(s): AST, ALT, ALKPHOS, BILITOT, PROT, ALBUMIN in the last 168 hours. No results for input(s): LIPASE, AMYLASE in the last 168 hours. No results for input(s): AMMONIA in the last 168 hours. CBC: Recent Labs  Lab 01/21/21 1334 01/23/21 0211 01/27/21 0335  WBC 4.7 3.6* 3.6*  HGB 11.8* 10.3* 10.3*  HCT 35.0* 30.4* 30.8*  MCV 90.0 89.4 90.1  PLT 97* 105* 164   Cardiac Enzymes: No results for input(s): CKTOTAL, CKMB, CKMBINDEX, TROPONINI in the last 168 hours. BNP: Invalid input(s): POCBNP CBG: No results for input(s): GLUCAP in the last 168 hours. D-Dimer No results for input(s): DDIMER in the last 72 hours. Hgb A1c No results for input(s): HGBA1C in the last 72 hours. Lipid Profile No results for input(s): CHOL, HDL, LDLCALC, TRIG, CHOLHDL, LDLDIRECT in the last 72 hours. Thyroid function studies No results for input(s): TSH, T4TOTAL, T3FREE, THYROIDAB in the last 72 hours.  Invalid input(s): FREET3 Anemia work up No results for  input(s): VITAMINB12, FOLATE, FERRITIN, TIBC, IRON, RETICCTPCT in the last 72 hours. Urinalysis    Component Value Date/Time   COLORURINE YELLOW 01/19/2021 1001   APPEARANCEUR CLEAR 01/19/2021 1001   LABSPEC 1.013 01/19/2021 1001   PHURINE 6.0 01/19/2021 1001   GLUCOSEU NEGATIVE 01/19/2021 1001   HGBUR NEGATIVE 01/19/2021 1001   HGBUR negative 02/01/2008 1038   BILIRUBINUR NEGATIVE 01/19/2021 1001   KETONESUR NEGATIVE 01/19/2021 1001   PROTEINUR NEGATIVE 01/19/2021 1001   UROBILINOGEN 1.0 08/14/2008 0851   NITRITE NEGATIVE 01/19/2021 1001   LEUKOCYTESUR TRACE (A) 01/19/2021 1001   Sepsis Labs Invalid input(s): PROCALCITONIN,  WBC,  LACTICIDVEN Microbiology Recent Results (  from the past 240 hour(s))  Resp Panel by RT-PCR (Flu A&B, Covid) Nasopharyngeal Swab     Status: None   Collection Time: 01/19/21  7:38 AM   Specimen: Nasopharyngeal Swab; Nasopharyngeal(NP) swabs in vial transport medium  Result Value Ref Range Status   SARS Coronavirus 2 by RT PCR NEGATIVE NEGATIVE Final    Comment: (NOTE) SARS-CoV-2 target nucleic acids are NOT DETECTED.  The SARS-CoV-2 RNA is generally detectable in upper respiratory specimens during the acute phase of infection. The lowest concentration of SARS-CoV-2 viral copies this assay can detect is 138 copies/mL. A negative result does not preclude SARS-Cov-2 infection and should not be used as the sole basis for treatment or other patient management decisions. A negative result may occur with  improper specimen collection/handling, submission of specimen other than nasopharyngeal swab, presence of viral mutation(s) within the areas targeted by this assay, and inadequate number of viral copies(<138 copies/mL). A negative result must be combined with clinical observations, patient history, and epidemiological information. The expected result is Negative.  Fact Sheet for Patients:  BloggerCourse.com  Fact Sheet for  Healthcare Providers:  SeriousBroker.it  This test is no t yet approved or cleared by the [place] FDA and  has been authorized for detection and/or diagnosis of SARS-CoV-2 by FDA under an Emergency Use Authorization (EUA). This EUA will remain  in effect (meaning this test can be used) for the duration of the COVID-19 declaration under Section 564(b)(1) of the Act, 21 U.S.C.section 360bbb-3(b)(1), unless the authorization is terminated  or revoked sooner.       Influenza A by PCR NEGATIVE NEGATIVE Final   Influenza B by PCR NEGATIVE NEGATIVE Final    Comment: (NOTE) The Xpert Xpress SARS-CoV-2/FLU/RSV plus assay is intended as an aid in the diagnosis of influenza from Nasopharyngeal swab specimens and should not be used as a sole basis for treatment. Nasal washings and aspirates are unacceptable for Xpert Xpress SARS-CoV-2/FLU/RSV testing.  Fact Sheet for Patients: BloggerCourse.com  Fact Sheet for Healthcare Providers: SeriousBroker.it  This test is not yet approved or cleared by the [place] FDA and has been authorized for detection and/or diagnosis of SARS-CoV-2 by FDA under an Emergency Use Authorization (EUA). This EUA will remain in effect (meaning this test can be used) for the duration of the COVID-19 declaration under Section 564(b)(1) of the Act, 21 U.S.C. section 360bbb-3(b)(1), unless the authorization is terminated or revoked.  Performed at [facility], [address] [place], [place] [address]   Surgical pcr screen     Status: None   Collection Time: 01/19/21  6:21 PM   Specimen: Nasal Mucosa; Nasal Swab  Result Value Ref Range Status   MRSA, PCR NEGATIVE NEGATIVE Final   Staphylococcus aureus NEGATIVE NEGATIVE Final    Comment: (NOTE) The Xpert SA Assay (FDA approved for NASAL specimens in patients 36 years of age and older), is one component of a  comprehensive surveillance program. It is not intended to diagnose infection nor to guide or monitor treatment. Performed at [facility], [address], [place], [place] [address]   Culture, blood (Routine X 2) w Reflex to ID Panel     Status: None   Collection Time: 01/21/21  1:34 PM   Specimen: BLOOD LEFT ARM  Result Value Ref Range Status   Specimen Description BLOOD LEFT ARM  Final   Special Requests   Final    BOTTLES DRAWN AEROBIC AND ANAEROBIC Blood Culture adequate volume  Culture   Final    NO GROWTH 5 DAYS Performed at [facility], [address] [place], [place] [address]    Report Status 01/26/2021 FINAL  Final  Culture, blood (Routine X 2) w Reflex to ID Panel     Status: None   Collection Time: 01/21/21  1:42 PM   Specimen: BLOOD LEFT ARM  Result Value Ref Range Status   Specimen Description BLOOD LEFT ARM  Final   Special Requests   Final    BOTTLES DRAWN AEROBIC ONLY Blood Culture results may not be optimal due to an inadequate volume of blood received in culture bottles   Culture   Final    NO GROWTH 5 DAYS Performed at [facility], [address], [place], [place] [address]    Report Status 01/26/2021 FINAL  Final  Resp Panel by RT-PCR (Flu A&B, Covid) Nasopharyngeal Swab     Status: None   Collection Time: 01/24/21  3:39 PM   Specimen: Nasopharyngeal Swab; Nasopharyngeal(NP) swabs in vial transport medium  Result Value Ref Range Status   SARS Coronavirus 2 by RT PCR NEGATIVE NEGATIVE Final    Comment: (NOTE) SARS-CoV-2 target nucleic acids are NOT DETECTED.  The SARS-CoV-2 RNA is generally detectable in upper respiratory specimens during the acute phase of infection. The lowest concentration of SARS-CoV-2 viral copies this assay can detect is 138 copies/mL. A negative result does not preclude SARS-Cov-2 infection and should not be used as the sole basis for treatment or other patient management decisions. A negative result may  occur with  improper specimen collection/handling, submission of specimen other than nasopharyngeal swab, presence of viral mutation(s) within the areas targeted by this assay, and inadequate number of viral copies(<138 copies/mL). A negative result must be combined with clinical observations, patient history, and epidemiological information. The expected result is Negative.  Fact Sheet for Patients:  BloggerCourse.com  Fact Sheet for Healthcare Providers:  SeriousBroker.it  This test is no t yet approved or cleared by the [place] FDA and  has been authorized for detection and/or diagnosis of SARS-CoV-2 by FDA under an Emergency Use Authorization (EUA). This EUA will remain  in effect (meaning this test can be used) for the duration of the COVID-19 declaration under Section 564(b)(1) of the Act, 21 U.S.C.section 360bbb-3(b)(1), unless the authorization is terminated  or revoked sooner.       Influenza A by PCR NEGATIVE NEGATIVE Final   Influenza B by PCR NEGATIVE NEGATIVE Final    Comment: (NOTE) The Xpert Xpress SARS-CoV-2/FLU/RSV plus assay is intended as an aid in the diagnosis of influenza from Nasopharyngeal swab specimens and should not be used as a sole basis for treatment. Nasal washings and aspirates are unacceptable for Xpert Xpress SARS-CoV-2/FLU/RSV testing.  Fact Sheet for Patients: BloggerCourse.com  Fact Sheet for Healthcare Providers: SeriousBroker.it  This test is not yet approved or cleared by the [place] FDA and has been authorized for detection and/or diagnosis of SARS-CoV-2 by FDA under an Emergency Use Authorization (EUA). This EUA will remain in effect (meaning this test can be used) for the duration of the COVID-19 declaration under Section 564(b)(1) of the Act, 21 U.S.C. section 360bbb-3(b)(1), unless the authorization is terminated  or revoked.  Performed at [facility], [address] [place], [place] [address]   Resp Panel by RT-PCR (Flu A&B, Covid) Nasopharyngeal Swab     Status: None   Collection Time: 01/27/21 10:35 AM   Specimen: Nasopharyngeal  Swab; Nasopharyngeal(NP) swabs in vial transport medium  Result Value Ref Range Status   SARS Coronavirus 2 by RT PCR NEGATIVE NEGATIVE Final    Comment: (NOTE) SARS-CoV-2 target nucleic acids are NOT DETECTED.  The SARS-CoV-2 RNA is generally detectable in upper respiratory specimens during the acute phase of infection. The lowest concentration of SARS-CoV-2 viral copies this assay can detect is 138 copies/mL. A negative result does not preclude SARS-Cov-2 infection and should not be used as the sole basis for treatment or other patient management decisions. A negative result may occur with  improper specimen collection/handling, submission of specimen other than nasopharyngeal swab, presence of viral mutation(s) within the areas targeted by this assay, and inadequate number of viral copies(<138 copies/mL). A negative result must be combined with clinical observations, patient history, and epidemiological information. The expected result is Negative.  Fact Sheet for Patients:  BloggerCourse.com  Fact Sheet for Healthcare Providers:  SeriousBroker.it  This test is no t yet approved or cleared by the [place] FDA and  has been authorized for detection and/or diagnosis of SARS-CoV-2 by FDA under an Emergency Use Authorization (EUA). This EUA will remain  in effect (meaning this test can be used) for the duration of the COVID-19 declaration under Section 564(b)(1) of the Act, 21 U.S.C.section 360bbb-3(b)(1), unless the authorization is terminated  or revoked sooner.       Influenza A by PCR NEGATIVE NEGATIVE Final   Influenza B by PCR NEGATIVE NEGATIVE Final    Comment: (NOTE) The Xpert Xpress  SARS-CoV-2/FLU/RSV plus assay is intended as an aid in the diagnosis of influenza from Nasopharyngeal swab specimens and should not be used as a sole basis for treatment. Nasal washings and aspirates are unacceptable for Xpert Xpress SARS-CoV-2/FLU/RSV testing.  Fact Sheet for Patients: BloggerCourse.com  Fact Sheet for Healthcare Providers: SeriousBroker.it  This test is not yet approved or cleared by the [place] FDA and has been authorized for detection and/or diagnosis of SARS-CoV-2 by FDA under an Emergency Use Authorization (EUA). This EUA will remain in effect (meaning this test can be used) for the duration of the COVID-19 declaration under Section 564(b)(1) of the Act, 21 U.S.C. section 360bbb-3(b)(1), unless the authorization is terminated or revoked.  Performed at [facility], [address] [place], [place] [address]      Time coordinating discharge: 35 minutes  SIGNED:   Jacquelin Hawking, MD [facility] 01/27/2021, 2:04 PM

## 2021-01-27 NOTE — TOC Transition Note (Signed)
Transition of Care [facility]) - CM/SW Discharge Note   Patient Details  Name: Elizabeth Tucker MRN: [id number] Date of Birth: [birth year]  [facility] CM/SW Contact:  Ralene Bathe, LCSWA Phone Number: 01/27/2021, 4:03 PM   Clinical Narrative:     Patient will DC to: [facility] Anticipated DC date: 01/27/2021 Family notified: Yes Transport by: Sharin Mons   Per MD patient ready for DC to SNF . RN to call report prior to discharge [phone]. RN, patient, patient's family, and facility notified of DC. Discharge Summary and FL2 sent to facility. DC packet on chart. Ambulance transport requested for patient.   CSW will sign off for now as social work intervention is no longer needed. Please consult [place] again if new needs arise.    Final next level of care: Skilled Nursing Facility Barriers to Discharge: Barriers Resolved   Patient Goals and CMS Choice   CMS Medicare.gov Compare Post Acute Care list provided to:: Patient Represenative (must comment) (Emegency Contact Ms. Winslow) Choice offered to / list presented to : Adult Children  Discharge Placement   Existing PASRR number confirmed : 01/27/21          Patient chooses bed at: [facility] Patient to be transferred to facility by: [facility] Name of family member notified: Scheryl Darter (Other)   [phone] Patient and family notified of of transfer: 01/27/21  Discharge Plan and Services                                     Social Determinants of Health (SDOH) Interventions     Readmission Risk Interventions No flowsheet data found.

## 2021-01-27 NOTE — Progress Notes (Signed)
Report given to [facility] at [facility]. All questions answered. Pt belongings gathered to be sent with her. [facility] transport.

## 2021-01-27 NOTE — Plan of Care (Signed)
  Problem: Activity: Goal: Ability to ambulate and perform ADLs will improve Outcome: Progressing   Problem: Pain Managment: Goal: General experience of comfort will improve Outcome: Progressing   

## 2021-02-11 ENCOUNTER — Encounter: Payer: Medicare (Managed Care) | Admitting: Orthopaedic Surgery

## 2021-02-17 ENCOUNTER — Encounter: Payer: Medicare (Managed Care) | Admitting: Orthopaedic Surgery

## 2021-02-23 ENCOUNTER — Encounter: Payer: Self-pay | Admitting: Orthopaedic Surgery

## 2021-02-23 ENCOUNTER — Other Ambulatory Visit: Payer: Self-pay

## 2021-02-23 ENCOUNTER — Ambulatory Visit (INDEPENDENT_AMBULATORY_CARE_PROVIDER_SITE_OTHER): Payer: Medicare (Managed Care)

## 2021-02-23 ENCOUNTER — Ambulatory Visit (INDEPENDENT_AMBULATORY_CARE_PROVIDER_SITE_OTHER): Payer: Medicare (Managed Care) | Admitting: Orthopaedic Surgery

## 2021-02-23 DIAGNOSIS — Z96642 Presence of left artificial hip joint: Secondary | ICD-10-CM

## 2021-02-23 NOTE — Progress Notes (Signed)
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  She is 75 years old.  She stays in a skilled nursing facility permanently.  She reports only minimal left hip pain.  On exam the staples are actually still in so removing them.  There is no evidence of infection at all.  The incision is healed over nicely.  There is a mild seroma and some swelling.  Her leg lengths are equal.  She will continue to increase her activities as comfort allows.  Given her fall risk she should still be only up with assistance.  We can see her back in 3 months and at that visit have an AP pelvis and lateral of her left operative hip.  This can be done supine.

## 2021-05-26 ENCOUNTER — Encounter: Payer: Medicare (Managed Care) | Admitting: Orthopaedic Surgery

## 2022-05-26 IMAGING — RF DG HIP (WITH OR WITHOUT PELVIS) 2-3V*L*
1 series · 3 of 3 positions shown · non-contrast
Comparison: 01/19/2021

CLINICAL DATA: Anterior left hip arthroplasty

EXAM:
DG C-ARM 1-60 MIN; DG HIP (WITH OR WITHOUT PELVIS) 2-3V LEFT
FLUOROSCOPY TIME:  Fluoroscopy Time:  33 seconds
Radiation Exposure Index (if provided by the fluoroscopic device):
1.69 mGy
Number of Acquired Spot Images: 3

[Series 1: run · 3 of 3 slices shown]
[im 1/3]
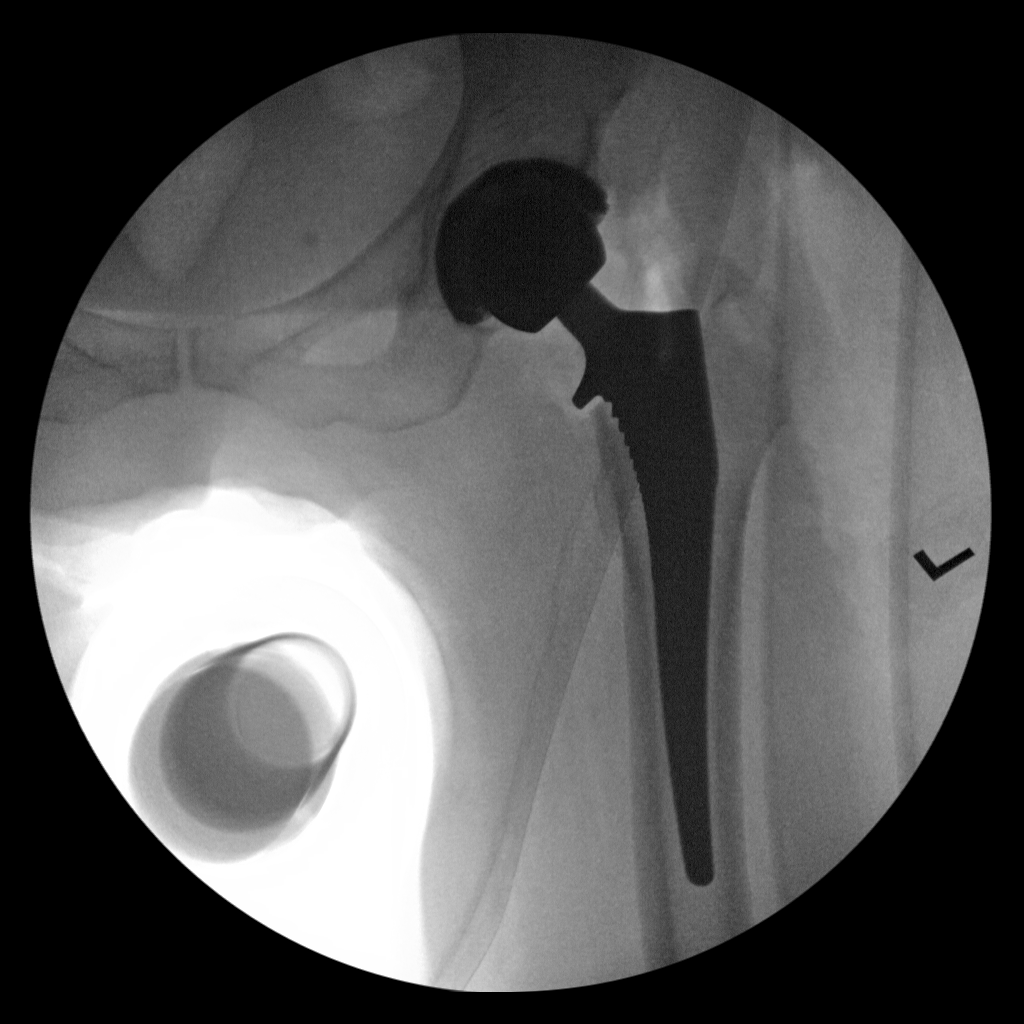
[im 2/3]
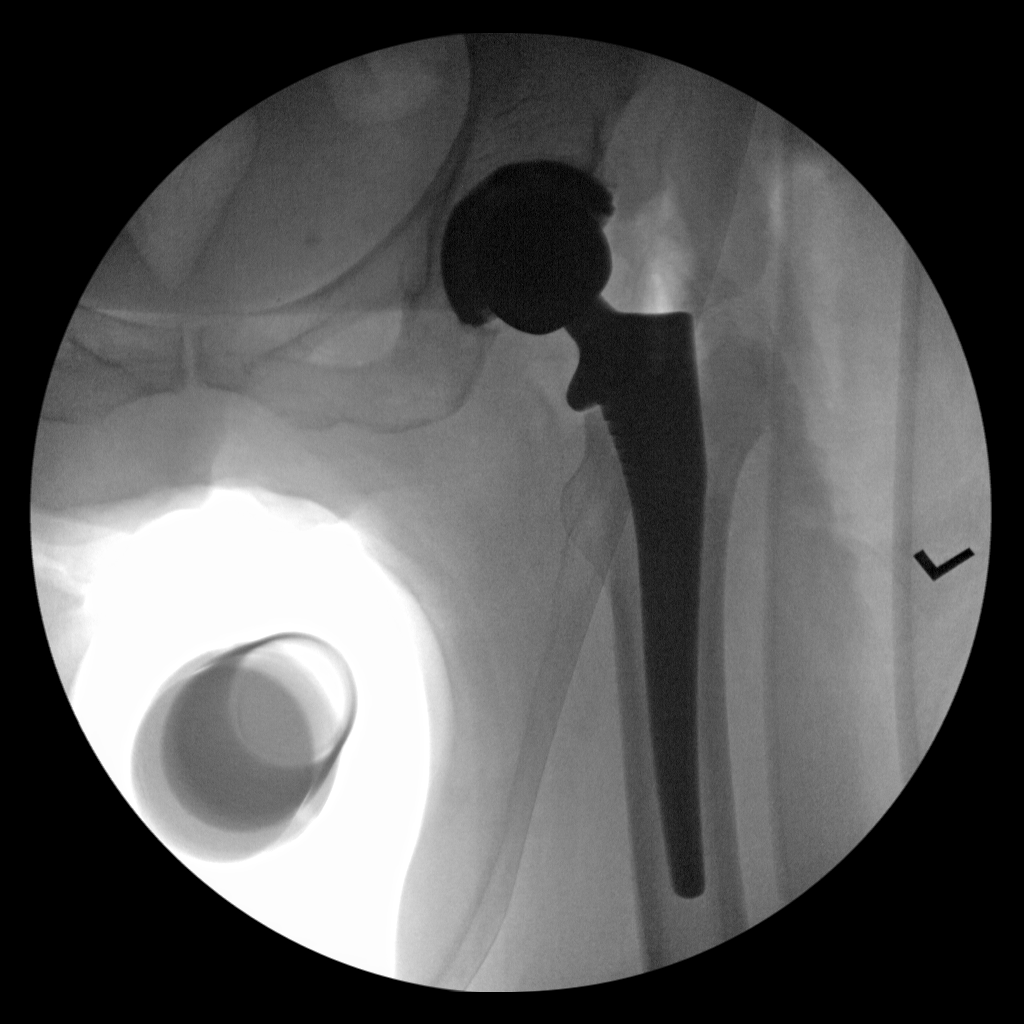
[im 3/3]
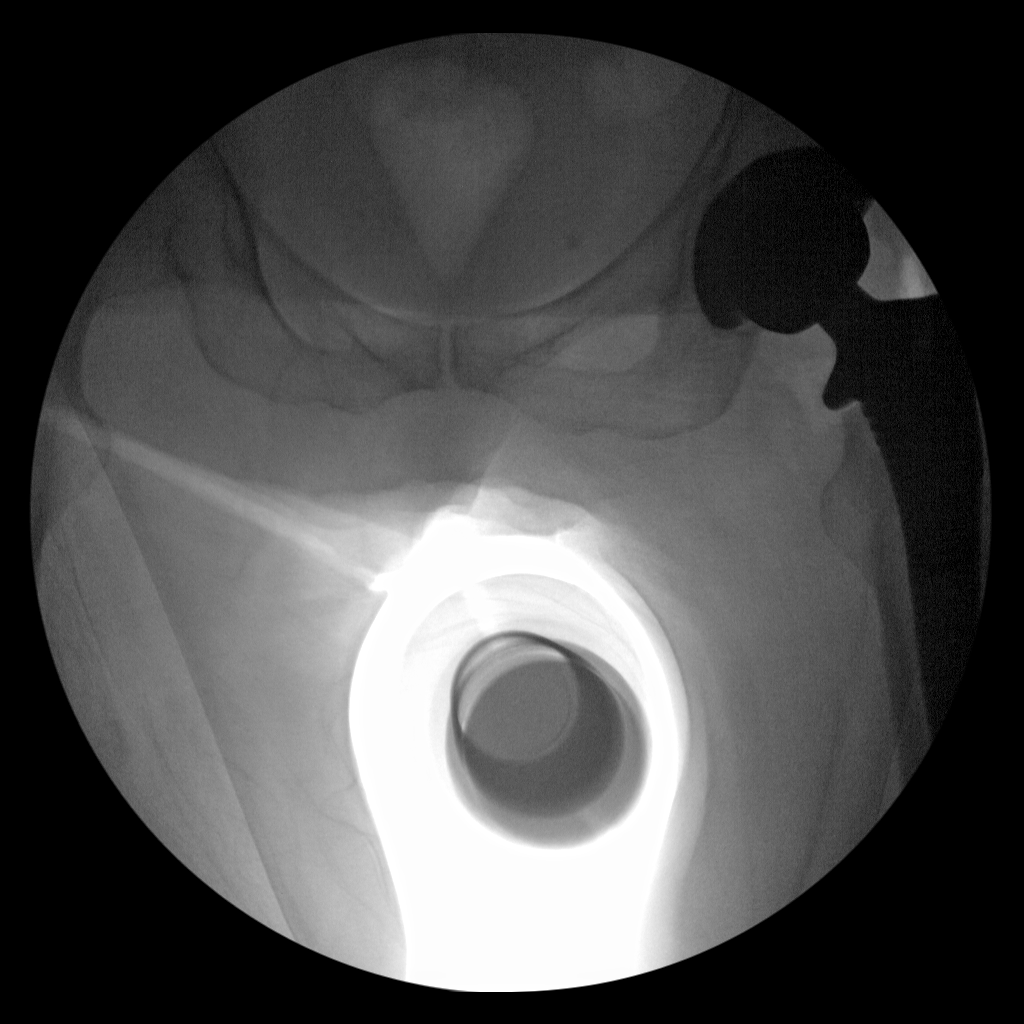

[3 of 3 positions shown; findings below may reference images not displayed]

FINDINGS: Three fluoroscopic images are obtained during the performance of the
procedure and are provided for interpretation only. A left hip
arthroplasty is identified in the expected position without signs of
complication.
IMPRESSION: 1. Unremarkable left hip arthroplasty. Please refer to the operative
report.

## 2022-05-28 IMAGING — DX DG CHEST 1V PORT
1 series · 1 of 1 positions shown · non-contrast
Comparison: Portable exam 8488 hours compared to 01/19/2021

CLINICAL DATA: Fever

EXAM:
PORTABLE CHEST 1 VIEW

[chest ap]
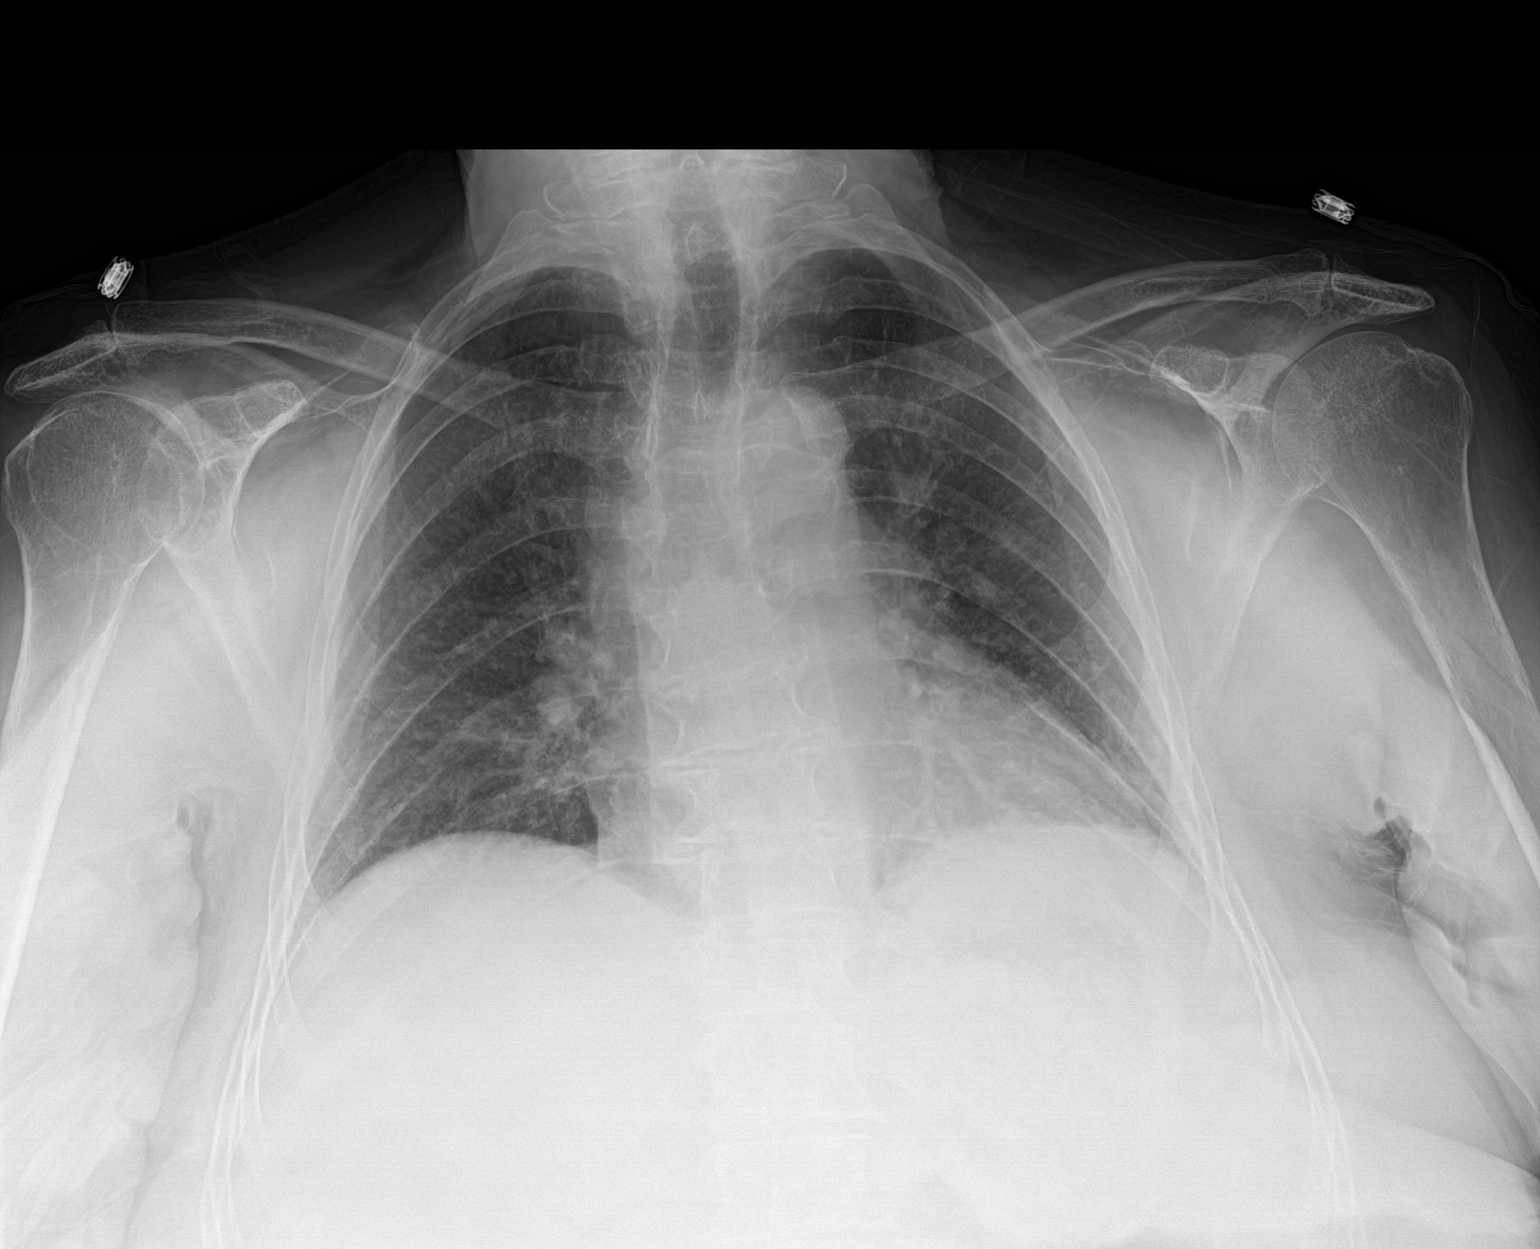

[1 of 1 positions shown; findings below may reference images not displayed]

FINDINGS: Upper normal heart size.

Mediastinal contours and pulmonary vascularity normal.

Atherosclerotic calcification aorta.

Minimal bibasilar atelectasis.

Lungs otherwise clear.

No infiltrate, pleural effusion, or pneumothorax.

Bones demineralized.
IMPRESSION: Minimal bibasilar atelectasis.

Aortic Atherosclerosis ([id number]).

## 2022-05-31 IMAGING — DX DG SHOULDER 2+V PORT*R*
3 series · 3 of 3 positions shown · non-contrast
Comparison: None.

CLINICAL DATA: Pain following recent trauma

EXAM:
PORTABLE RIGHT SHOULDER: 3 V

[shoulder ap]
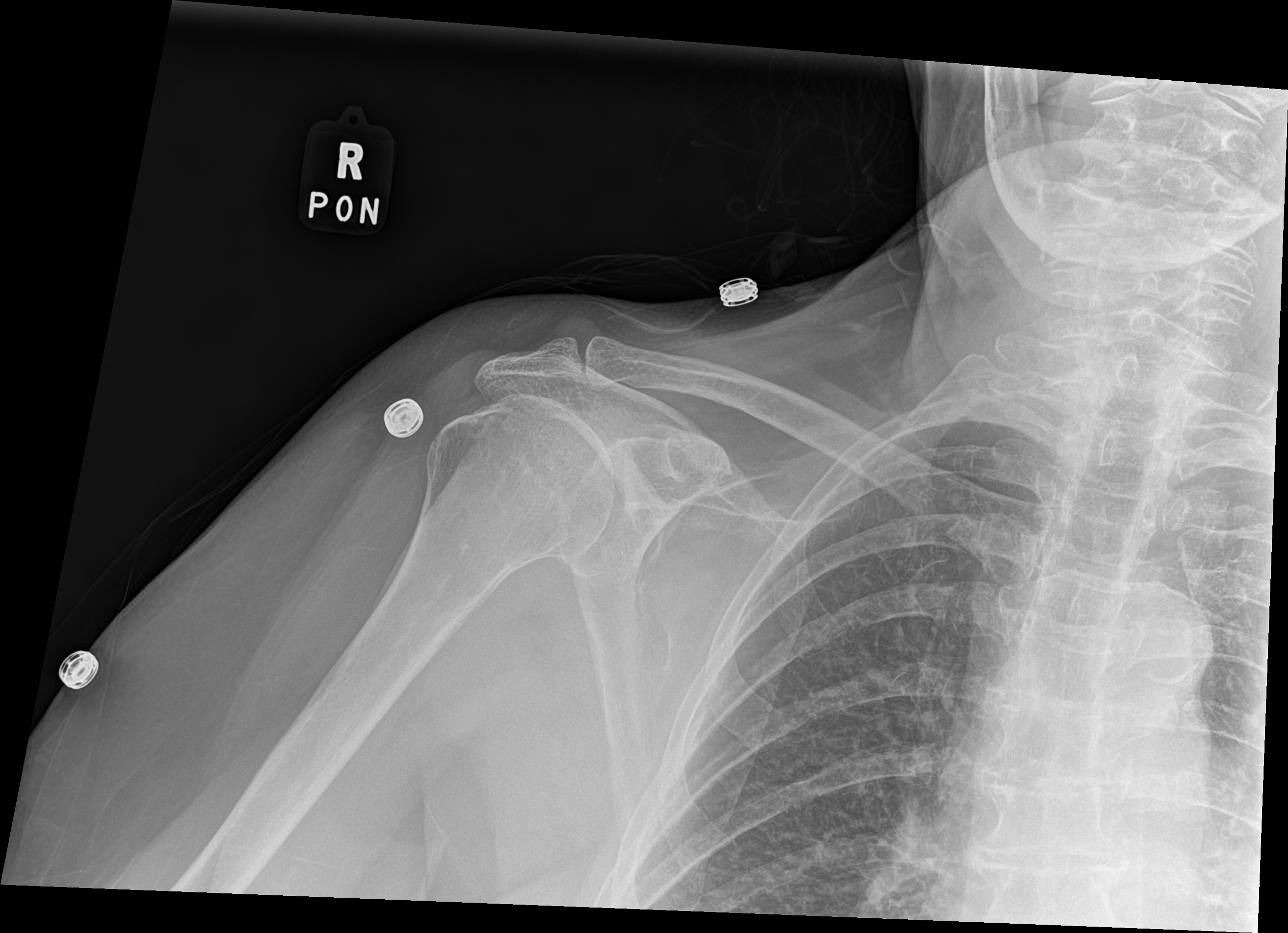

[shoulder obl]
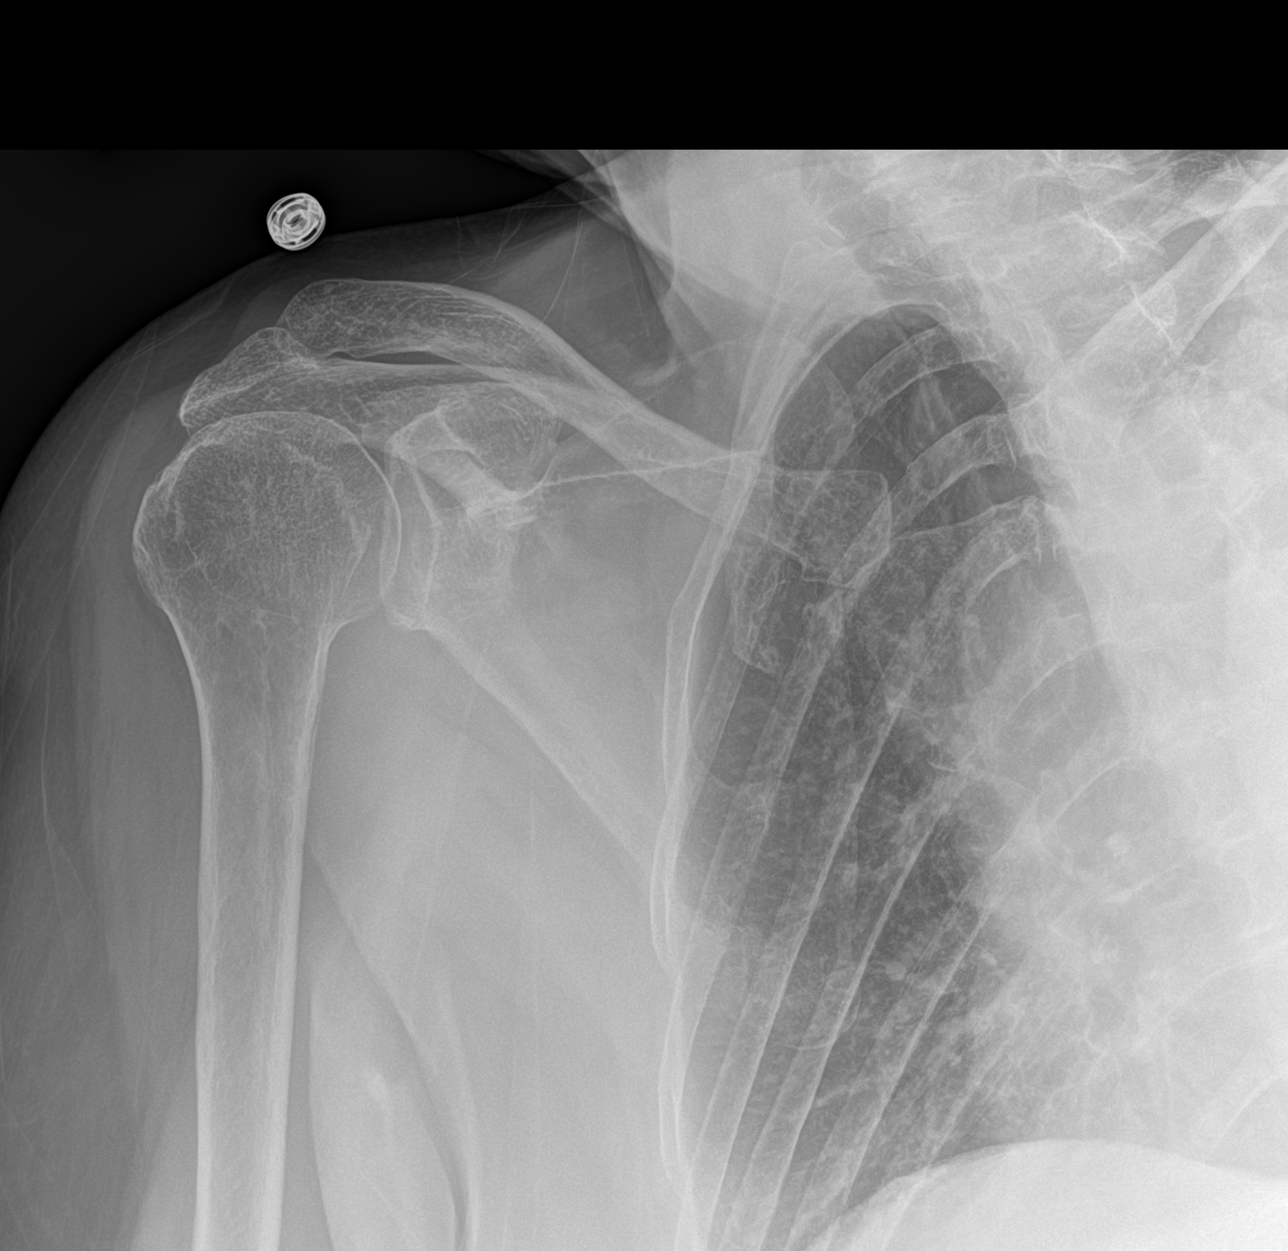

[shoulder axial]
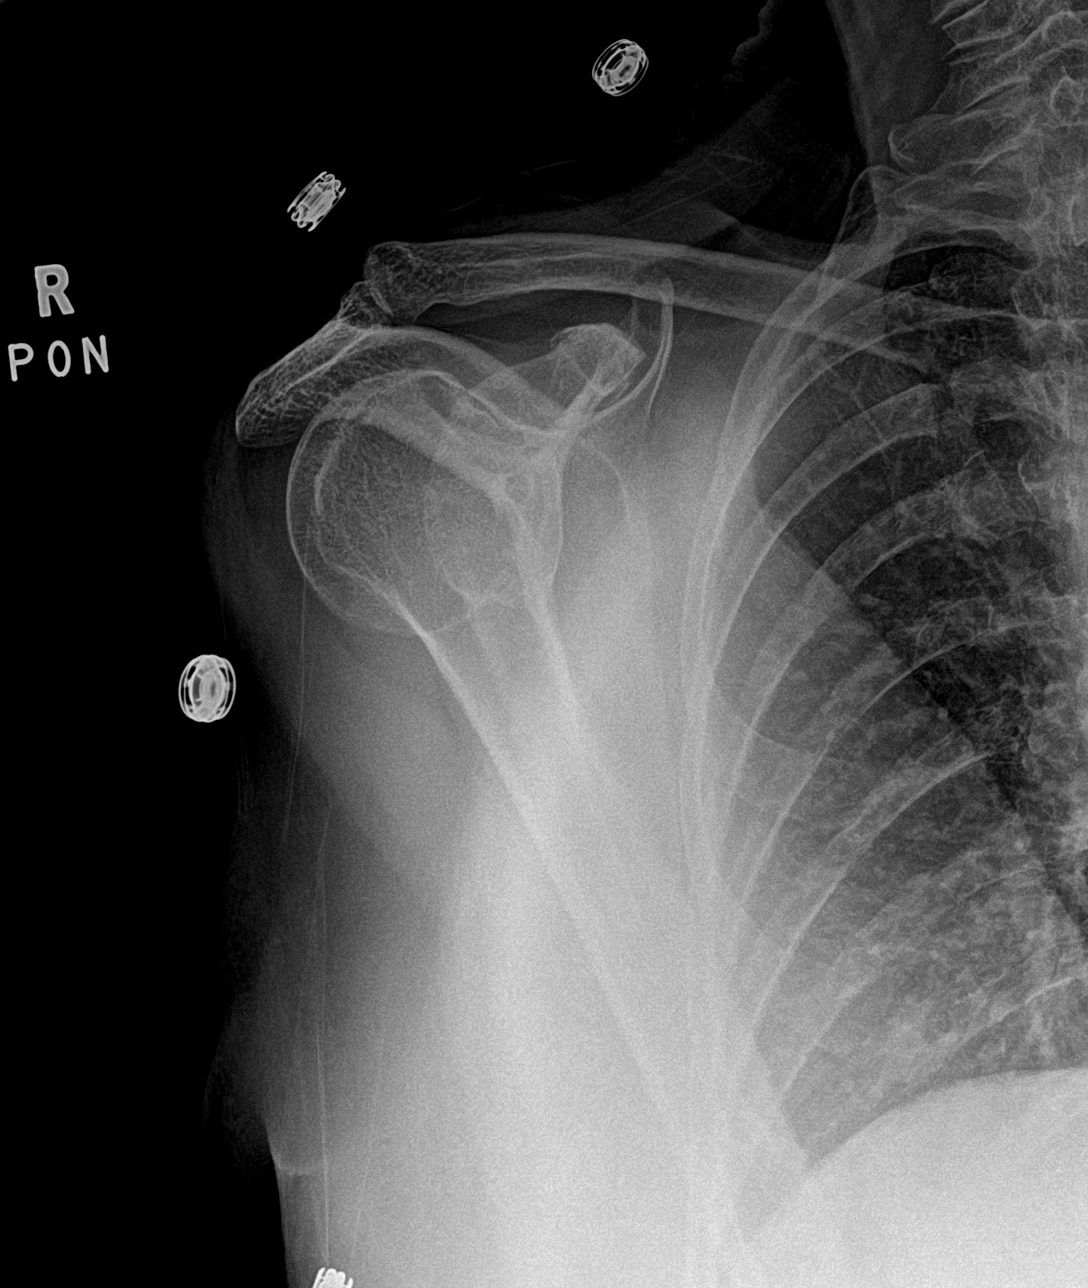

[3 of 3 positions shown; findings below may reference images not displayed]

FINDINGS: Frontal, oblique, and Y scapular images were obtained. No fracture
or dislocation. There is mild generalized joint space narrowing. No
erosive change. Visualized right lung clear.
IMPRESSION: Mild generalized osteoarthritic change.  No fracture or dislocation.

## 2023-08-16 ENCOUNTER — Emergency Department (HOSPITAL_COMMUNITY)

## 2023-08-16 ENCOUNTER — Encounter (HOSPITAL_COMMUNITY): Payer: Self-pay | Admitting: Radiology

## 2023-08-16 ENCOUNTER — Other Ambulatory Visit: Payer: Self-pay

## 2023-08-16 ENCOUNTER — Inpatient Hospital Stay (HOSPITAL_COMMUNITY)
Admission: EM | Admit: 2023-08-16 | Discharge: 2023-08-24 | DRG: 522 | Disposition: A | Discharge status: Skilled Nursing Facility | Source: Skilled Nursing Facility | Attending: Internal Medicine | Admitting: Internal Medicine

## 2023-08-16 DIAGNOSIS — F02818 Dementia in other diseases classified elsewhere, unspecified severity, with other behavioral disturbance: Secondary | ICD-10-CM | POA: Diagnosis present

## 2023-08-16 DIAGNOSIS — S92502A Displaced unspecified fracture of left lesser toe(s), initial encounter for closed fracture: Secondary | ICD-10-CM | POA: Diagnosis present

## 2023-08-16 DIAGNOSIS — Y92129 Unspecified place in nursing home as the place of occurrence of the external cause: Secondary | ICD-10-CM | POA: Diagnosis not present

## 2023-08-16 DIAGNOSIS — B1920 Unspecified viral hepatitis C without hepatic coma: Secondary | ICD-10-CM | POA: Diagnosis present

## 2023-08-16 DIAGNOSIS — G309 Alzheimer's disease, unspecified: Secondary | ICD-10-CM | POA: Diagnosis present

## 2023-08-16 DIAGNOSIS — Z87891 Personal history of nicotine dependence: Secondary | ICD-10-CM | POA: Diagnosis not present

## 2023-08-16 DIAGNOSIS — Z791 Long term (current) use of non-steroidal anti-inflammatories (NSAID): Secondary | ICD-10-CM

## 2023-08-16 DIAGNOSIS — F03918 Unspecified dementia, unspecified severity, with other behavioral disturbance: Secondary | ICD-10-CM | POA: Diagnosis not present

## 2023-08-16 DIAGNOSIS — M25551 Pain in right hip: Secondary | ICD-10-CM | POA: Diagnosis present

## 2023-08-16 DIAGNOSIS — Z7982 Long term (current) use of aspirin: Secondary | ICD-10-CM | POA: Diagnosis not present

## 2023-08-16 DIAGNOSIS — S72011A Unspecified intracapsular fracture of right femur, initial encounter for closed fracture: Principal | ICD-10-CM | POA: Insufficient documentation

## 2023-08-16 DIAGNOSIS — S72001A Fracture of unspecified part of neck of right femur, initial encounter for closed fracture: Principal | ICD-10-CM

## 2023-08-16 DIAGNOSIS — Z882 Allergy status to sulfonamides status: Secondary | ICD-10-CM

## 2023-08-16 DIAGNOSIS — Y92009 Unspecified place in unspecified non-institutional (private) residence as the place of occurrence of the external cause: Secondary | ICD-10-CM

## 2023-08-16 DIAGNOSIS — Z79899 Other long term (current) drug therapy: Secondary | ICD-10-CM | POA: Diagnosis not present

## 2023-08-16 DIAGNOSIS — W19XXXA Unspecified fall, initial encounter: Secondary | ICD-10-CM | POA: Diagnosis present

## 2023-08-16 DIAGNOSIS — F0390 Unspecified dementia without behavioral disturbance: Secondary | ICD-10-CM | POA: Diagnosis not present

## 2023-08-16 DIAGNOSIS — Z66 Do not resuscitate: Secondary | ICD-10-CM | POA: Diagnosis present

## 2023-08-16 DIAGNOSIS — Z751 Person awaiting admission to adequate facility elsewhere: Secondary | ICD-10-CM

## 2023-08-16 DIAGNOSIS — Z96642 Presence of left artificial hip joint: Secondary | ICD-10-CM | POA: Diagnosis present

## 2023-08-16 DIAGNOSIS — S92402A Displaced unspecified fracture of left great toe, initial encounter for closed fracture: Secondary | ICD-10-CM | POA: Diagnosis present

## 2023-08-16 LAB — BASIC METABOLIC PANEL
Anion gap: 9 (ref 5–15)
BUN: 18 mg/dL (ref 8–23)
CO2: 24 mmol/L (ref 22–32)
Calcium: 8.9 mg/dL (ref 8.9–10.3)
Chloride: 106 mmol/L (ref 98–111)
Creatinine, Ser: 0.55 mg/dL (ref 0.44–1.00)
GFR, Estimated: >60 mL/min (ref >60)
Glucose, Bld: 106 mg/dL — ABNORMAL HIGH (ref 70–99)
Potassium: 3.9 mmol/L (ref 3.5–5.1)
Sodium: 139 mmol/L (ref 135–145)

## 2023-08-16 LAB — CBC WITH DIFFERENTIAL/PLATELET
Abs Immature Granulocytes: 0.02 10*3/uL (ref 0.00–0.07)
Basophils Absolute: 0 10*3/uL (ref 0.0–0.1)
Basophils Relative: 0 %
Eosinophils Absolute: 0 10*3/uL (ref 0.0–0.5)
Eosinophils Relative: 0 %
HCT: 37.6 % (ref 36.0–46.0)
Hemoglobin: 13.1 g/dL (ref 12.0–15.0)
Immature Granulocytes: 1 %
Lymphocytes Relative: 15 %
Lymphs Abs: 0.6 10*3/uL — ABNORMAL LOW (ref 0.7–4.0)
MCH: 31.2 pg (ref 26.0–34.0)
MCHC: 34.8 g/dL (ref 30.0–36.0)
MCV: 89.5 fL (ref 80.0–100.0)
Monocytes Absolute: 0.3 10*3/uL (ref 0.1–1.0)
Monocytes Relative: 6 %
Neutro Abs: 3.2 10*3/uL (ref 1.7–7.7)
Neutrophils Relative %: 78 %
Platelets: 108 10*3/uL — ABNORMAL LOW (ref 150–400)
RBC: 4.2 MIL/uL (ref 3.87–5.11)
RDW: 14 % (ref 11.5–15.5)
WBC: 4.1 10*3/uL (ref 4.0–10.5)
nRBC: 0 % (ref 0.0–0.2)

## 2023-08-16 LAB — PROTIME-INR
INR: 1.2 (ref 0.8–1.2)
Prothrombin Time: 15.3 s — ABNORMAL HIGH (ref 11.4–15.2)

## 2023-08-16 LAB — APTT: aPTT: 30 s (ref 24–36)

## 2023-08-16 MED ORDER — SENNOSIDES-DOCUSATE SODIUM 8.6-50 MG PO TABS
2.0000 | ORAL_TABLET | Freq: Two times a day (BID) | ORAL | Status: DC
  Administered 2023-08-17 – 2023-08-24 (×13): 2 via ORAL
  Filled 2023-08-16 (×14): qty 2

## 2023-08-16 MED ORDER — KCL-LACTATED RINGERS-D5W 20 MEQ/L IV SOLN
INTRAVENOUS | Status: AC
  Filled 2023-08-16 (×2): qty 1000

## 2023-08-16 MED ORDER — MICONAZOLE NITRATE 2 % EX POWD: 1.0000 | Freq: Two times a day (BID) | CUTANEOUS | Status: DC

## 2023-08-16 MED ORDER — MORPHINE SULFATE (PF) 2 MG/ML IV SOLN
1.0000 mg | INTRAVENOUS | Status: DC | PRN
  Filled 2023-08-16: qty 1

## 2023-08-16 MED ORDER — ENOXAPARIN SODIUM 30 MG/0.3ML IJ SOSY: 30.0000 mg | PREFILLED_SYRINGE | Freq: Every day | INTRAMUSCULAR | Status: DC

## 2023-08-16 MED ORDER — SODIUM CHLORIDE 0.9% FLUSH
3.0000 mL | Freq: Two times a day (BID) | INTRAVENOUS | Status: DC
  Administered 2023-08-17 – 2023-08-24 (×14): 3 mL via INTRAVENOUS

## 2023-08-16 MED ORDER — TRAZODONE HCL 50 MG PO TABS
50.0000 mg | ORAL_TABLET | Freq: Every day | ORAL | Status: DC
  Administered 2023-08-17 – 2023-08-23 (×8): 50 mg via ORAL
  Filled 2023-08-16 (×8): qty 1

## 2023-08-16 MED ORDER — ACETAMINOPHEN 325 MG PO TABS
650.0000 mg | ORAL_TABLET | ORAL | Status: DC
  Administered 2023-08-16 – 2023-08-17 (×4): 650 mg via ORAL
  Filled 2023-08-16 (×5): qty 2

## 2023-08-16 MED ORDER — POLYVINYL ALCOHOL 1.4 % OP SOLN
1.0000 [drp] | Freq: Two times a day (BID) | OPHTHALMIC | Status: DC
  Administered 2023-08-17 – 2023-08-24 (×15): 1 [drp] via OPHTHALMIC
  Filled 2023-08-16: qty 15

## 2023-08-16 MED ORDER — ACETAMINOPHEN 650 MG RE SUPP: 650.0000 mg | Freq: Four times a day (QID) | RECTAL | Status: DC | PRN

## 2023-08-16 MED ORDER — ACETAMINOPHEN 325 MG PO TABS: 650.0000 mg | ORAL_TABLET | Freq: Four times a day (QID) | ORAL | Status: DC | PRN

## 2023-08-16 MED ORDER — ENOXAPARIN SODIUM 40 MG/0.4ML IJ SOSY: 40.0000 mg | PREFILLED_SYRINGE | Freq: Every day | INTRAMUSCULAR | Status: DC

## 2023-08-16 MED ORDER — ZINC OXIDE 20 % EX OINT
1.0000 | TOPICAL_OINTMENT | Freq: Three times a day (TID) | CUTANEOUS | Status: DC
  Administered 2023-08-17 – 2023-08-24 (×20): 1 via TOPICAL
  Filled 2023-08-16: qty 28.35

## 2023-08-16 MED ORDER — CELECOXIB 200 MG PO CAPS
200.0000 mg | ORAL_CAPSULE | Freq: Two times a day (BID) | ORAL | Status: AC
  Administered 2023-08-17 – 2023-08-19 (×4): 200 mg via ORAL
  Filled 2023-08-16 (×6): qty 1

## 2023-08-16 MED ORDER — HALOPERIDOL 1 MG PO TABS
2.0000 mg | ORAL_TABLET | Freq: Two times a day (BID) | ORAL | Status: DC
  Administered 2023-08-17 – 2023-08-24 (×13): 2 mg via ORAL
  Filled 2023-08-16 (×17): qty 2

## 2023-08-16 MED ORDER — POLYETHYLENE GLYCOL 3350 17 G PO PACK
17.0000 g | PACK | Freq: Every day | ORAL | Status: DC | PRN
Start: 2023-08-16 — End: 2023-08-18

## 2023-08-16 NOTE — ED Notes (Addendum)
ED TO INPATIENT HANDOFF REPORT  Name/Age/Gender Elizabeth Tucker 77 y.o. female  Code Status    Code Status Orders  (From admission, onward)           Start     Ordered   08/16/23 1746  Do not attempt resuscitation (DNR)- Limited -Do Not Intubate (DNI)  Continuous       Question Answer Comment  If pulseless and not breathing No CPR or chest compressions.   In Pre-Arrest Conditions (Patient Is Breathing and Has A Pulse) Do not intubate. Provide all appropriate non-invasive medical interventions. Avoid ICU transfer unless indicated or required.   Consent: Discussion documented in EHR or advanced directives reviewed      08/16/23 1747           Code Status History     Date Active Date Inactive Code Status Order ID Comments User Context   01/19/2021 1002 01/27/2021 2226 Full Code [id number]  Jonah Blue, MD ED       Home/SNF/Other Home  Chief Complaint Fall [W19.XXXA]  Level of Care/Admitting Diagnosis ED Disposition     ED Disposition  Admit   Condition  --   Comment  Hospital Area: [facility] [[id number]]  Level of Care: Med-Surg [16]  May admit patient to [facility] or [facility] if equivalent level of care is available:: No  Covid Evaluation: Asymptomatic - no recent exposure (last 10 days) testing not required  Diagnosis: Fall [290176]  Admitting Physician: Nolberto Hanlon [[id number]]  Attending Physician: Nolberto Hanlon [[id number]]  Certification:: I certify this patient will need inpatient services for at least 2 midnights  Expected Medical Readiness: 08/18/2023          Medical History Past Medical History:  Diagnosis Date   Alzheimer disease (HCC)    Complication of anesthesia    unknown, pt has a hx of dementia   GERD (gastroesophageal reflux disease)    Hepatitis    Hepatitis C     Allergies Allergies  Allergen Reactions   Sulfonamide Derivatives Other (See Comments)    Childhood allergy     IV  Location/Drains/Wounds Patient Lines/Drains/Airways Status     Active Line/Drains/Airways     Name Placement date Placement time Site Days   Peripheral IV 08/16/23 20 G Left Antecubital 08/16/23  1836  Antecubital  less than 1   Incision (Closed) 01/19/21 Hip Left 01/19/21  2210  -- 939            Labs/Imaging Results for orders placed or performed during the hospital encounter of 08/16/23 (from the past 48 hours)  CBC with Differential/Platelet     Status: Abnormal   Collection Time: 08/16/23  2:26 PM  Result Value Ref Range   WBC 4.1 4.0 - 10.5 K/uL   RBC 4.20 3.87 - 5.11 MIL/uL   Hemoglobin 13.1 12.0 - 15.0 g/dL   HCT 46.9 62.9 - 52.8 %   MCV 89.5 80.0 - 100.0 fL   MCH 31.2 26.0 - 34.0 pg   MCHC 34.8 30.0 - 36.0 g/dL   RDW 41.3 24.4 - 01.0 %   Platelets 108 (L) 150 - 400 K/uL    Comment: Immature Platelet Fraction may be clinically indicated, consider ordering this additional test [id number]    nRBC 0.0 0.0 - 0.2 %   Neutrophils Relative % 78 %   Neutro Abs 3.2 1.7 - 7.7 K/uL   Lymphocytes Relative 15 %   Lymphs Abs 0.6 (L) 0.7 - 4.0 K/uL  Monocytes Relative 6 %   Monocytes Absolute 0.3 0.1 - 1.0 K/uL   Eosinophils Relative 0 %   Eosinophils Absolute 0.0 0.0 - 0.5 K/uL   Basophils Relative 0 %   Basophils Absolute 0.0 0.0 - 0.1 K/uL   Immature Granulocytes 1 %   Abs Immature Granulocytes 0.02 0.00 - 0.07 K/uL    Comment: Performed at [facility], [address] [place], [place] [address]  Basic metabolic panel     Status: Abnormal   Collection Time: 08/16/23  2:26 PM  Result Value Ref Range   Sodium 139 135 - 145 mmol/L   Potassium 3.9 3.5 - 5.1 mmol/L   Chloride 106 98 - 111 mmol/L   CO2 24 22 - 32 mmol/L   Glucose, Bld 106 (H) 70 - 99 mg/dL    Comment: Glucose reference range applies only to samples taken after fasting for at least 8 hours.   BUN 18 8 - 23 mg/dL   Creatinine, Ser 2.44 0.44 - 1.00 mg/dL   Calcium 8.9 8.9 - 01.0 mg/dL    GFR, Estimated >27 >25 mL/min    Comment: (NOTE) Calculated using the CKD-EPI Creatinine Equation (2021)    Anion gap 9 5 - 15    Comment: Performed at [facility], [address] [place], [place] [address]  Protime-INR     Status: Abnormal   Collection Time: 08/16/23  6:32 PM  Result Value Ref Range   Prothrombin Time 15.3 (H) 11.4 - 15.2 seconds   INR 1.2 0.8 - 1.2    Comment: (NOTE) INR goal varies based on device and disease states. Performed at [facility], [address] [place], [place] [address]   APTT     Status: None   Collection Time: 08/16/23  6:32 PM  Result Value Ref Range   aPTT 30 24 - 36 seconds    Comment: Performed at [facility], [address], [place], [place] [address]   CT Cervical Spine Wo Contrast Result Date: 08/16/2023 CLINICAL DATA:  Mechanical fall yesterday.  Toe fracture. EXAM: CT CERVICAL SPINE WITHOUT CONTRAST TECHNIQUE: Multidetector CT imaging of the cervical spine was performed without intravenous contrast. Multiplanar CT image reconstructions were also generated. RADIATION DOSE REDUCTION: This exam was performed according to the departmental dose-optimization program which includes automated exposure control, adjustment of the mA and/or kV according to patient size and/or use of iterative reconstruction technique. COMPARISON:  CT of the cervical spine 01/19/2021 FINDINGS: Alignment: Slight degenerative retrolisthesis at C3-4 and C4-5 is stable. Exaggerated cervical lordosis and upper thoracic kyphosis is noted. Slight anterolisthesis at C7-T1 is noted. Skull base and vertebrae: Superior endplate Schmorl's noted T1 is stable. Chronic endplate degenerative changes at C4-5 are stable. No acute fracture is present. Soft tissues and spinal canal: No prevertebral fluid or swelling. No visible canal hematoma. Disc levels: The lung apices are clear. Uncovertebral spurring leads to moderate foraminal  stenosis bilaterally at C4-5 and C6-7. Upper chest: The lung apices are clear. The thoracic inlet is within normal limits. IMPRESSION: 1. No acute fracture or traumatic subluxation. 2. Stable degenerative changes of the cervical spine. 3. Moderate foraminal stenosis bilaterally at C4-5 and C6-7. Electronically Signed   By: Marin Roberts M.D.   On: 08/16/2023 15:39   CT Head Wo Contrast Result Date: 08/16/2023 CLINICAL DATA:  Mechanical fall yesterday.  Toe fracture. EXAM: CT HEAD WITHOUT CONTRAST TECHNIQUE: Contiguous axial images were obtained from the base of  the skull through the vertex without intravenous contrast. RADIATION DOSE REDUCTION: This exam was performed according to the departmental dose-optimization program which includes automated exposure control, adjustment of the mA and/or kV according to patient size and/or use of iterative reconstruction technique. COMPARISON:  CT head without contrast 01/19/2021 FINDINGS: Brain: Mild periventricular and subcortical white matter hypoattenuation is similar the prior exam. No acute infarct, hemorrhage, or mass lesion is present. Deep brain nuclei are within normal limits. The ventricles are of normal size. No significant extraaxial fluid collection is present. The brainstem and cerebellum are within normal limits. Midline structures are within normal limits. Vascular: No hyperdense vessel or unexpected calcification. Skull: Calvarium is intact. No focal lytic or blastic lesions are present. Minimal soft tissue swelling is noted along the lateral aspect of the right orbit. No underlying fracture or foreign body is present. The extracranial soft tissues are otherwise within normal limits. Sinuses/Orbits: Bilateral mastoid effusions are present, right greater than left. No obstructing nasopharyngeal lesion is present. The paranasal sinuses are otherwise clear. The globes and orbits are within normal limits. IMPRESSION: 1. No acute intracranial abnormality  or significant interval change. 2. Minimal soft tissue swelling along the lateral aspect of the right orbit without underlying fracture or foreign body. 3. Bilateral mastoid effusions, right greater than left. No obstructing nasopharyngeal lesion is present. Electronically Signed   By: Marin Roberts M.D.   On: 08/16/2023 15:35   DG Chest Port 1 View Result Date: 08/16/2023 CLINICAL DATA:  Fall. EXAM: PORTABLE CHEST 1 VIEW COMPARISON:  Chest radiograph dated January 21, 2021 FINDINGS: The heart size and mediastinal contours are within normal limits. Aortic atherosclerosis. No focal consolidation, pleural effusion, or pneumothorax. Diffusely decreased osseous mineralization. Suspected compression deformity of the T11 vertebral body is faintly visualized on this exam and appears new since the prior exam dated January 21, 2021. No obvious displaced rib fracture identified. IMPRESSION: 1. Suspected age-indeterminate compression deformity of the T11 vertebral body is faintly visualized and appears new since the prior exam dated January 21, 2021. Recommend correlation with physical exam/point tenderness. Consider further evaluation with CT thoracic spine. 2. No acute cardiopulmonary findings. Electronically Signed   By: Hart Robinsons M.D.   On: 08/16/2023 14:17   DG Hip Unilat W or Wo Pelvis 2-3 Views Left Result Date: 08/16/2023 CLINICAL DATA:  Status post fall with inability to bear weight on the left leg EXAM: DG HIP (WITH OR WITHOUT PELVIS) 3V LEFT COMPARISON:  Left hip radiographs dated 02/23/2021 FINDINGS: Left hip arthroplasty. Hardware appears intact and well seated. No abnormal surrounding lucency. Subcapital right femoral fracture with shortened appearance of the proximal right femur. No evidence of hip dislocation. IMPRESSION: 1. Subcapital RIGHT femoral fracture with shortened appearance of the proximal right femur. 2. Left hip arthroplasty without evidence of hardware complication. Electronically Signed    By: Agustin Cree M.D.   On: 08/16/2023 13:18   DG Foot Complete Left Result Date: 08/16/2023 CLINICAL DATA:  Fall.  Unable to bear weight. EXAM: LEFT ANKLE COMPLETE - 3+ VIEW; LEFT FOOT - COMPLETE 3+ VIEW COMPARISON:  None Available. FINDINGS: There is diffuse osteopenia of the visualized osseous structures. There is mildly displaced fracture of the proximal portion of the proximal phalanx of fifth toe. No other acute fracture or dislocation. No aggressive osseous lesion. Ankle mortise appears intact. No focal soft tissue swelling. No radiopaque foreign bodies. IMPRESSION: *Mildly displaced fracture of the proximal portion of the proximal phalanx of fifth toe. Electronically Signed  By: Jules Schick M.D.   On: 08/16/2023 13:12   DG Ankle Complete Left Result Date: 08/16/2023 CLINICAL DATA:  Fall.  Unable to bear weight. EXAM: LEFT ANKLE COMPLETE - 3+ VIEW; LEFT FOOT - COMPLETE 3+ VIEW COMPARISON:  None Available. FINDINGS: There is diffuse osteopenia of the visualized osseous structures. There is mildly displaced fracture of the proximal portion of the proximal phalanx of fifth toe. No other acute fracture or dislocation. No aggressive osseous lesion. Ankle mortise appears intact. No focal soft tissue swelling. No radiopaque foreign bodies. IMPRESSION: *Mildly displaced fracture of the proximal portion of the proximal phalanx of fifth toe. Electronically Signed   By: Jules Schick M.D.   On: 08/16/2023 13:12    Pending Labs Unresulted Labs (From admission, onward)     Start     Ordered   08/17/23 0500  APTT  Tomorrow morning,   R        08/16/23 1747   08/17/23 0500  Protime-INR  Tomorrow morning,   R        08/16/23 1747   08/17/23 0500  Basic metabolic panel  Tomorrow morning,   R        08/16/23 1747   08/17/23 0500  CBC  Tomorrow morning,   R        08/16/23 1747            Vitals/Pain Today's Vitals   08/16/23 1204 08/16/23 1415 08/16/23 1530  BP: 109/74 (!) 118/90 110/80   Pulse: 84 86 86  Resp: 17 19 16   Temp: 97.7 F (36.5 C)  98.2 F (36.8 C)  TempSrc:   Axillary  SpO2: 97% 100% 100%    Isolation Precautions No active isolations  Medications Medications  celecoxib (CELEBREX) capsule 200 mg (has no administration in time range)  acetaminophen (TYLENOL) tablet 650 mg (650 mg Oral Given 08/16/23 1837)  morphine (PF) 2 MG/ML injection 1 mg (has no administration in time range)  senna-docusate (Senokot-S) tablet 2 tablet (has no administration in time range)  traZODone (DESYREL) tablet 50 mg (has no administration in time range)  haloperidol (HALDOL) tablet 2 mg (has no administration in time range)  miconazole (MICOTIN) 2 % powder 1 Application (has no administration in time range)  zinc oxide 20 % ointment 1 Application (has no administration in time range)  Propylene Glycol 0.6 % SOLN 1 drop (has no administration in time range)  acetaminophen (TYLENOL) tablet 650 mg (has no administration in time range)    Or  acetaminophen (TYLENOL) suppository 650 mg (has no administration in time range)  polyethylene glycol (MIRALAX / GLYCOLAX) packet 17 g (has no administration in time range)  sodium chloride flush (NS) 0.9 % injection 3 mL (has no administration in time range)  dextrose 5% in lactated ringers with KCl 20 mEq/L infusion (has no administration in time range)    Mobility non-ambulatory

## 2023-08-16 NOTE — ED Provider Notes (Signed)
[place] EMERGENCY DEPARTMENT AT [facility] Provider Note   CSN: [id number] Arrival date & time: 08/16/23  1156     History  Chief Complaint  Patient presents with   Elizabeth Tucker is a 77 y.o. female.  77 year old female here after unwitnessed fall at the nursing facility.  Patient has history dementia.  Patient was noted to have pain with weightbearing on her left foot and ankle.  She does not take any blood thinners at this time.  No reported history of head trauma.  Reports be at her neurological baseline otherwise.  Presents via EMS       Home Medications Prior to Admission medications   Medication Sig Start Date End Date Taking? Authorizing Provider  acetaminophen (TYLENOL) 500 MG tablet Take 500 mg by mouth every 6 (six) hours as needed for moderate pain, fever or headache.    [provider]  aluminum-magnesium hydroxide-simethicone (MAALOX) 200-200-20 MG/5ML SUSP Take 30 mLs by mouth every 6 (six) hours as needed (heartburn/indigestion).    [provider]  aspirin 81 MG chewable tablet Chew 1 tablet (81 mg total) by mouth 2 (two) times daily after a meal. Take for 2 weeks only. 01/21/21   Kathryne Hitch, MD  docusate sodium (COLACE) 100 MG capsule Take 1 capsule (100 mg total) by mouth 2 (two) times daily. 01/27/21   Narda Bonds, MD  feeding supplement (ENSURE ENLIVE / ENSURE PLUS) LIQD Take 237 mLs by mouth 2 (two) times daily between meals. 01/27/21   Narda Bonds, MD  guaifenesin (ROBITUSSIN) 100 MG/5ML syrup Take 200 mg by mouth every 6 (six) hours as needed for cough.    [provider]  haloperidol (HALDOL) 5 MG tablet Take 1 tablet (5 mg total) by mouth every 6 (six) hours as needed for agitation. 01/26/21   Rai, Delene Ruffini, MD  HYDROcodone-acetaminophen (NORCO/VICODIN) 5-325 MG tablet Take 1 tablet by mouth every 6 (six) hours as needed for moderate pain (pain score 4-6). 01/21/21   Kathryne Hitch, MD   LORazepam (ATIVAN) 0.5 MG tablet Take 1 tablet (0.5 mg total) by mouth 2 (two) times daily. 01/26/21   Rai, Ripudeep K, MD  LORazepam (ATIVAN) 1 MG tablet Take 1 tablet (1 mg total) by mouth every 6 (six) hours as needed for anxiety. 01/26/21   Rai, Delene Ruffini, MD  magnesium hydroxide (MILK OF MAGNESIA) 400 MG/5ML suspension Take 30 mLs by mouth at bedtime as needed for mild constipation.    [provider]  Multiple Vitamin (MULTIVITAMIN WITH MINERALS) TABS tablet Take 1 tablet by mouth daily. 01/28/21   Narda Bonds, MD  Neomycin-Bacitracin-Polymyxin (TRIPLE ANTIBIOTIC) [phone] OINT Apply 1 application topically as needed (skin tears/abraisons).    [provider]  omeprazole (PRILOSEC) 40 MG capsule Take 40 mg by mouth daily.    [provider]  traZODone (DESYREL) 50 MG tablet Take 50 mg by mouth at bedtime.    [provider]  ziprasidone (GEODON) 40 MG capsule Take 40 mg by mouth daily.    [provider]      Allergies    Sulfonamide derivatives    Review of Systems   Review of Systems  Unable to perform ROS: Dementia    Physical Exam Updated Vital Signs BP 109/74   Pulse 84   Temp 97.7 F (36.5 C)   Resp 17   SpO2 97%  Physical Exam Vitals and nursing note reviewed.  Constitutional:  General: She is not in acute distress.    Appearance: Normal appearance. She is well-developed. She is not toxic-appearing.  HENT:     Head: Normocephalic and atraumatic.     Comments: No visible signs of head trauma. Eyes:     General: Lids are normal.     Conjunctiva/sclera: Conjunctivae normal.     Pupils: Pupils are equal, round, and reactive to light.  Neck:     Thyroid: No thyroid mass.     Trachea: No tracheal deviation.  Cardiovascular:     Rate and Rhythm: Normal rate and regular rhythm.     Heart sounds: Normal heart sounds. No murmur heard.    No gallop.  Pulmonary:     Effort: Pulmonary effort is normal. No respiratory  distress.     Breath sounds: Normal breath sounds. No stridor. No decreased breath sounds, wheezing, rhonchi or rales.  Abdominal:     General: There is no distension.     Palpations: Abdomen is soft.     Tenderness: There is no abdominal tenderness. There is no rebound.  Musculoskeletal:        General: No tenderness. Normal range of motion.     Cervical back: Normal range of motion and neck supple.     Comments: Nontender to palpation at left ankle with palpation.  No deformities appreciated.  Dorsal surface of left foot slightly edematous without evidence of ecchymosis.  No pain with range of motion of patient's left hip or left knee.  Nontender to palpation at left thigh and left calf.  Skin:    General: Skin is warm and dry.     Findings: No abrasion or rash.  Neurological:     General: No focal deficit present.     Mental Status: She is alert. Mental status is at baseline. She is disoriented.     GCS: GCS eye subscore is 4. GCS verbal subscore is 5. GCS motor subscore is 6.     Cranial Nerves: No cranial nerve deficit.     Sensory: No sensory deficit.     Motor: Motor function is intact.  Psychiatric:        Attention and Perception: Attention normal.        Speech: Speech normal.        Behavior: Behavior normal.     ED Results / Procedures / Treatments   Labs (all labs ordered are listed, but only abnormal results are displayed) Labs Reviewed - No data to display  EKG None  Radiology No results found.  Procedures Procedures    Medications Ordered in ED Medications - No data to display  ED Course/ Medical Decision Making/ A&P                                 Medical Decision Making Amount and/or Complexity of Data Reviewed Labs: ordered. Radiology: ordered.   X-ray of patient's left foot shows a mildly displaced fracture of the proximal portion of the proximal phalanx of the fifth toe.  Right hip does show a subcapital hip fracture.  Findings discussed  with Dr. Roda Shutters who recommends that patient be admitted to [facility].  Discussed this with patient's power of attorney who would like to speak to Dr. Deno Etienne.  Phone number given to him.  Recommends no acute intervention for patient's fracture toe on the the left foot.  Will consult hospitalist team  Final Clinical Impression(s) / ED Diagnoses Final diagnoses:  None    Rx / DC Orders ED Discharge Orders     None         Lorre Nick, MD 08/16/23 1419

## 2023-08-16 NOTE — ED Triage Notes (Signed)
Pt arrived via [facility] from [facility] facility.EMS states that he was told by the facility that pt fell yesterday, and they noticed that she is unable to bear weight on her left ankle today.   HR 72  BP 100/70 97% RA

## 2023-08-16 NOTE — ED Provider Notes (Signed)
  Physical Exam  BP (!) 118/90   Pulse 86   Temp 97.7 F (36.5 C)   Resp 19   SpO2 100%   Physical Exam  Procedures  Procedures  ED Course / MDM    Medical Decision Making Care assumed at 3 PM.  Patient is here with left foot swelling and pain.  Unfortunately x-ray showed a left big toe fracture and also right hip fracture.  Unclear when she fell.  Signout pending CT head and cervical spine.  Previous provider talked to ortho, Dr. Roda Shutters, who recommend n.p.o. after midnight and transfer to [facility] under the hospitalist service.  4:00 PM I reviewed patient's CT scan and they were unremarkable.  Patient will be admitted by the hospitalist and will transfer to [facility] for surgery tomorrow  Problems Addressed: Closed fracture of phalanx of left fifth toe, initial encounter: acute illness or injury Closed fracture of right hip, initial encounter [facility]): acute illness or injury Fall, initial encounter: acute illness or injury  Amount and/or Complexity of Data Reviewed Labs: ordered. Decision-making details documented in ED Course. Radiology: ordered and independent interpretation performed. Decision-making details documented in ED Course.  Risk Decision regarding hospitalization.       Charlynne Pander, MD 08/16/23 [phone]

## 2023-08-16 NOTE — Progress Notes (Signed)
[facility] ED [address] [facility]       This patient is a current hospice patient with Physicist, medical at [facility], admitted with a terminal diagnosis of abnormal weight loss.    We will continue to follow for any discharge planning needs and to coordinate continuation of hospice care.   Please don't hesitate to call with any Hospice related questions or concerns.    Thank you for the opportunity to participate in this patient's care. Thea Gist, Charity fundraiser, BSN [facility] Liaison [phone]

## 2023-08-16 NOTE — Assessment & Plan Note (Signed)
This was unwitnessed.  Happened sometime overnight last night.  Subsequently noticed to have left foot erythema where displaced fracture of the proximal portion of the fifth toe phalanx has been discovered.  Further found to have right subcapital femoral fracture.  Case has been discussed with Dr. Roda Shutters as well as with the goddaughter by myself.  Patient is felt to be a candidate for palliative surgery for pain reduction.  I will order DVT prophylaxis.  Transfer the patient to [facility].  I will order multimodal pain regimen as well.

## 2023-08-16 NOTE — H&P (Signed)
History and Physical    Patient: Elizabeth Tucker DOB: 1946/02/09 DOA: 08/16/2023 DOS: the patient was seen and examined on 08/16/2023 PCP: NgetichDonalee Citrin, NP  Patient coming from: SNF  Chief Complaint:  Chief Complaint  Patient presents with   Fall   HPI: Elizabeth Tucker is a 77 y.o. female with medical history significant of dementia.  Patient is at baseline not verbal.  And unable to provide history.  However she is able to get up at least with assistance and ambulate a bit.  Patient seems to have been in her usual state of health till late last evening.  At some point overnight she seems to have had a fall.  When patient was attempted to be ambulated earlier this morning, patient was unable to bear weight on right lower extremity.  Patient sent to the ER.  There is no report of any fever loss of consciousness tremor any other trauma.  History obtained from patient's goddaughter with winslow Latoya.  Patient has no surviving spouse or children.  Also her other listed contact the sister has passed away.  The goddaughter is the acting power of attorney.  Per report given to be by the car daughter.  Workup in the ER is notable for right femoral fracture.  Case has been discussed with Dr. Roda Shutters of the orthopedic surgeon.  Daughter advises to me that the impression is that the patient would have better pain control after open reduction internal fixation of the fracture.  Therefore patient is being admitted to [facility] in [place] where the operation can be done. Review of Systems: As mentioned in the history of present illness. All other systems reviewed and are negative. Past Medical History:  Diagnosis Date   Alzheimer disease (HCC)    Complication of anesthesia    unknown, pt has a hx of dementia   GERD (gastroesophageal reflux disease)    Hepatitis    Hepatitis C    Past Surgical History:  Procedure Laterality Date   CESAREAN SECTION     ELBOW SURGERY     TOTAL  HIP ARTHROPLASTY Left 01/19/2021   Procedure: TOTAL HIP ARTHROPLASTY ANTERIOR APPROACH;  Surgeon: Kathryne Hitch, MD;  Location: [facility] OR;  Service: Orthopedics;  Laterality: Left;   Social History:  reports that she has never smoked. She has never used smokeless tobacco. She reports that she does not drink alcohol and does not use drugs.  Allergies  Allergen Reactions   Sulfonamide Derivatives Other (See Comments)    Childhood allergy     Family History  Problem Relation Age of Onset   Dementia Mother    Liver disease Brother     Prior to Admission medications   Medication Sig Start Date End Date Taking? Authorizing Provider  guaifenesin (ROBITUSSIN) 100 MG/5ML syrup Take 200 mg by mouth every 4 (four) hours as needed for cough.   Yes [provider]  haloperidol (HALDOL) 2 MG tablet Take 2 mg by mouth 2 (two) times daily.   Yes [provider]  miconazole (MICOTIN) 2 % powder Apply 1 Application topically in the morning and at bedtime. Prn order: 1 application every 8 hours as needed for rash  Apply to breast and umbilical folds   Yes [provider]  Propylene Glycol (SYSTANE BALANCE) 0.6 % SOLN Apply to eye.   Yes [provider]  traZODone (DESYREL) 50 MG tablet Take 50 mg by mouth at bedtime.   Yes [provider]  UNABLE TO FIND Take 1 Bottle by mouth in the morning, at noon, and at bedtime. Med Name: mighty shake   Yes [provider]  zinc oxide 20 % ointment Apply 1 Application topically See admin instructions. 1 application to buttocks and peri area every shift   Yes [provider]    Physical Exam: Vitals:   08/16/23 1204 08/16/23 1415 08/16/23 1530  BP: 109/74 (!) 118/90 110/80  Pulse: 84 86 86  Resp: 17 19 16   Temp: 97.7 F (36.5 C)  98.2 F (36.8 C)  TempSrc:   Axillary  SpO2: 97% 100% 100%   General: Patient is alert and awake, does not make eye contact patient is not verbal and does not  follow directions. Respiratory exam: Bilateral intravesicular Cardiovascular exam S1-S2 normal Abdomen soft nontender Extremities warm no edema left distal foot erythema is noted, there is known fracture here, otherwise function is intact.  Patient is actually keeping her hips flexed.  Good function distally in the right lower extremity.  No focal pulses appreciated. Data Reviewed:  Labs on Admission:  Results for orders placed or performed during the hospital encounter of 08/16/23 (from the past 24 hours)  CBC with Differential/Platelet     Status: Abnormal   Collection Time: 08/16/23  2:26 PM  Result Value Ref Range   WBC 4.1 4.0 - 10.5 K/uL   RBC 4.20 3.87 - 5.11 MIL/uL   Hemoglobin 13.1 12.0 - 15.0 g/dL   HCT 41.3 24.4 - 01.0 %   MCV 89.5 80.0 - 100.0 fL   MCH 31.2 26.0 - 34.0 pg   MCHC 34.8 30.0 - 36.0 g/dL   RDW 27.2 53.6 - 64.4 %   Platelets 108 (L) 150 - 400 K/uL   nRBC 0.0 0.0 - 0.2 %   Neutrophils Relative % 78 %   Neutro Abs 3.2 1.7 - 7.7 K/uL   Lymphocytes Relative 15 %   Lymphs Abs 0.6 (L) 0.7 - 4.0 K/uL   Monocytes Relative 6 %   Monocytes Absolute 0.3 0.1 - 1.0 K/uL   Eosinophils Relative 0 %   Eosinophils Absolute 0.0 0.0 - 0.5 K/uL   Basophils Relative 0 %   Basophils Absolute 0.0 0.0 - 0.1 K/uL   Immature Granulocytes 1 %   Abs Immature Granulocytes 0.02 0.00 - 0.07 K/uL  Basic metabolic panel     Status: Abnormal   Collection Time: 08/16/23  2:26 PM  Result Value Ref Range   Sodium 139 135 - 145 mmol/L   Potassium 3.9 3.5 - 5.1 mmol/L   Chloride 106 98 - 111 mmol/L   CO2 24 22 - 32 mmol/L   Glucose, Bld 106 (H) 70 - 99 mg/dL   BUN 18 8 - 23 mg/dL   Creatinine, Ser 0.34 0.44 - 1.00 mg/dL   Calcium 8.9 8.9 - 74.2 mg/dL   GFR, Estimated >59 >56 mL/min   Anion gap 9 5 - 15   Basic Metabolic Panel: Recent Labs  Lab 08/16/23 1426  NA 139  K 3.9  CL 106  CO2 24  GLUCOSE 106*  BUN 18  CREATININE 0.55  CALCIUM 8.9   Liver Function Tests: No  results for input(s): "AST", "ALT", "ALKPHOS", "BILITOT", "PROT", "ALBUMIN" in the last 168 hours. No results for input(s): "LIPASE", "AMYLASE" in the last 168 hours. No results for input(s): "AMMONIA" in the last 168 hours. CBC: Recent Labs  Lab 08/16/23 1426  WBC 4.1  NEUTROABS 3.2  HGB 13.1  HCT 37.6  MCV 89.5  PLT 108*   Cardiac Enzymes: No results for input(s): "CKTOTAL", "CKMB", "CKMBINDEX", "TROPONINIHS" in the last 168 hours.  BNP (last 3 results) No results for input(s): "PROBNP" in the last 8760 hours. CBG: No results for input(s): "GLUCAP" in the last 168 hours.  Radiological Exams on Admission:  CT Cervical Spine Wo Contrast Result Date: 08/16/2023 CLINICAL DATA:  Mechanical fall yesterday.  Toe fracture. EXAM: CT CERVICAL SPINE WITHOUT CONTRAST TECHNIQUE: Multidetector CT imaging of the cervical spine was performed without intravenous contrast. Multiplanar CT image reconstructions were also generated. RADIATION DOSE REDUCTION: This exam was performed according to the departmental dose-optimization program which includes automated exposure control, adjustment of the mA and/or kV according to patient size and/or use of iterative reconstruction technique. COMPARISON:  CT of the cervical spine 01/19/2021 FINDINGS: Alignment: Slight degenerative retrolisthesis at C3-4 and C4-5 is stable. Exaggerated cervical lordosis and upper thoracic kyphosis is noted. Slight anterolisthesis at C7-T1 is noted. Skull base and vertebrae: Superior endplate Schmorl's noted T1 is stable. Chronic endplate degenerative changes at C4-5 are stable. No acute fracture is present. Soft tissues and spinal canal: No prevertebral fluid or swelling. No visible canal hematoma. Disc levels: The lung apices are clear. Uncovertebral spurring leads to moderate foraminal stenosis bilaterally at C4-5 and C6-7. Upper chest: The lung apices are clear. The thoracic inlet is within normal limits. IMPRESSION: 1. No acute  fracture or traumatic subluxation. 2. Stable degenerative changes of the cervical spine. 3. Moderate foraminal stenosis bilaterally at C4-5 and C6-7. Electronically Signed   By: Marin Roberts M.D.   On: 08/16/2023 15:39   CT Head Wo Contrast Result Date: 08/16/2023 CLINICAL DATA:  Mechanical fall yesterday.  Toe fracture. EXAM: CT HEAD WITHOUT CONTRAST TECHNIQUE: Contiguous axial images were obtained from the base of the skull through the vertex without intravenous contrast. RADIATION DOSE REDUCTION: This exam was performed according to the departmental dose-optimization program which includes automated exposure control, adjustment of the mA and/or kV according to patient size and/or use of iterative reconstruction technique. COMPARISON:  CT head without contrast 01/19/2021 FINDINGS: Brain: Mild periventricular and subcortical white matter hypoattenuation is similar the prior exam. No acute infarct, hemorrhage, or mass lesion is present. Deep brain nuclei are within normal limits. The ventricles are of normal size. No significant extraaxial fluid collection is present. The brainstem and cerebellum are within normal limits. Midline structures are within normal limits. Vascular: No hyperdense vessel or unexpected calcification. Skull: Calvarium is intact. No focal lytic or blastic lesions are present. Minimal soft tissue swelling is noted along the lateral aspect of the right orbit. No underlying fracture or foreign body is present. The extracranial soft tissues are otherwise within normal limits. Sinuses/Orbits: Bilateral mastoid effusions are present, right greater than left. No obstructing nasopharyngeal lesion is present. The paranasal sinuses are otherwise clear. The globes and orbits are within normal limits. IMPRESSION: 1. No acute intracranial abnormality or significant interval change. 2. Minimal soft tissue swelling along the lateral aspect of the right orbit without underlying fracture or foreign  body. 3. Bilateral mastoid effusions, right greater than left. No obstructing nasopharyngeal lesion is present. Electronically Signed   By: Marin Roberts M.D.   On: 08/16/2023 15:35   DG Chest Port 1 View Result Date: 08/16/2023 CLINICAL DATA:  Fall. EXAM: PORTABLE CHEST 1 VIEW COMPARISON:  Chest radiograph dated January 21, 2021 FINDINGS: The heart size and mediastinal contours are within normal limits. Aortic atherosclerosis. No focal consolidation, pleural  effusion, or pneumothorax. Diffusely decreased osseous mineralization. Suspected compression deformity of the T11 vertebral body is faintly visualized on this exam and appears new since the prior exam dated January 21, 2021. No obvious displaced rib fracture identified. IMPRESSION: 1. Suspected age-indeterminate compression deformity of the T11 vertebral body is faintly visualized and appears new since the prior exam dated January 21, 2021. Recommend correlation with physical exam/point tenderness. Consider further evaluation with CT thoracic spine. 2. No acute cardiopulmonary findings. Electronically Signed   By: Hart Robinsons M.D.   On: 08/16/2023 14:17   DG Hip Unilat W or Wo Pelvis 2-3 Views Left Result Date: 08/16/2023 CLINICAL DATA:  Status post fall with inability to bear weight on the left leg EXAM: DG HIP (WITH OR WITHOUT PELVIS) 3V LEFT COMPARISON:  Left hip radiographs dated 02/23/2021 FINDINGS: Left hip arthroplasty. Hardware appears intact and well seated. No abnormal surrounding lucency. Subcapital right femoral fracture with shortened appearance of the proximal right femur. No evidence of hip dislocation. IMPRESSION: 1. Subcapital RIGHT femoral fracture with shortened appearance of the proximal right femur. 2. Left hip arthroplasty without evidence of hardware complication. Electronically Signed   By: Agustin Cree M.D.   On: 08/16/2023 13:18   DG Foot Complete Left Result Date: 08/16/2023 CLINICAL DATA:  Fall.  Unable to bear weight. EXAM:  LEFT ANKLE COMPLETE - 3+ VIEW; LEFT FOOT - COMPLETE 3+ VIEW COMPARISON:  None Available. FINDINGS: There is diffuse osteopenia of the visualized osseous structures. There is mildly displaced fracture of the proximal portion of the proximal phalanx of fifth toe. No other acute fracture or dislocation. No aggressive osseous lesion. Ankle mortise appears intact. No focal soft tissue swelling. No radiopaque foreign bodies. IMPRESSION: *Mildly displaced fracture of the proximal portion of the proximal phalanx of fifth toe. Electronically Signed   By: Jules Schick M.D.   On: 08/16/2023 13:12   DG Ankle Complete Left Result Date: 08/16/2023 CLINICAL DATA:  Fall.  Unable to bear weight. EXAM: LEFT ANKLE COMPLETE - 3+ VIEW; LEFT FOOT - COMPLETE 3+ VIEW COMPARISON:  None Available. FINDINGS: There is diffuse osteopenia of the visualized osseous structures. There is mildly displaced fracture of the proximal portion of the proximal phalanx of fifth toe. No other acute fracture or dislocation. No aggressive osseous lesion. Ankle mortise appears intact. No focal soft tissue swelling. No radiopaque foreign bodies. IMPRESSION: *Mildly displaced fracture of the proximal portion of the proximal phalanx of fifth toe. Electronically Signed   By: Jules Schick M.D.   On: 08/16/2023 13:12    EKG: pending   Assessment and Plan: Fall at home, initial encounter This was unwitnessed.  Happened sometime overnight last night.  Subsequently noticed to have left foot erythema where displaced fracture of the proximal portion of the fifth toe phalanx has been discovered.  Further found to have right subcapital femoral fracture.  Case has been discussed with Dr. Roda Shutters as well as with the goddaughter by myself.  Patient is felt to be a candidate for palliative surgery for pain reduction.  I will order DVT prophylaxis.  [facility] the patient to [facility].  I will order multimodal pain regimen as well.  Dementia with behavioral  disturbance (HCC) Curently calm . Advanced dementia non verbal. C.w. haloperidol and trazodone. Wean as able.      Advance Care Planning:   Code Status: Prior DNR DNI . D.w. god daughter ACP on file. Goal is comfort/palliation of symptoms.  Consults: Dr. Roda Shutters  Family Communication:  discussed over phone in great detail  Severity of Illness: The appropriate patient status for this patient is INPATIENT. Inpatient status is judged to be reasonable and necessary in order to provide the required intensity of service to ensure the patient's safety. The patient's presenting symptoms, physical exam findings, and initial radiographic and laboratory data in the context of their chronic comorbidities is felt to place them at high risk for further clinical deterioration. Furthermore, it is not anticipated that the patient will be medically stable for discharge from the hospital within 2 midnights of admission.   * I certify that at the point of admission it is my clinical judgment that the patient will require inpatient hospital care spanning beyond 2 midnights from the point of admission due to high intensity of service, high risk for further deterioration and high frequency of surveillance required.*  Author: Nolberto Hanlon, MD 08/16/2023 5:12 PM  For on call review [email or web address].

## 2023-08-16 NOTE — ED Notes (Signed)
Called [facility] to give report and was told that they are in the middle of shift report and that to call after 15 minutes

## 2023-08-16 NOTE — Assessment & Plan Note (Signed)
Curently calm . Advanced dementia non verbal. C.w. haloperidol and trazodone. Wean as able.

## 2023-08-16 NOTE — Progress Notes (Signed)
Consult received from Dr. Freida Busman.  I have reviewed the pertinent imaging.  I have talked to the patient's POA Weldon Inches and explained treatment plan for surgery.  Patient is very active at baseline and does not use any devices for ambulation.  Patient is scheduled for surgery on Friday at [facility] [place].  Appreciate hospitalist assistance with transfer to [facility].  Full consult to follow.   Mayra Reel, MD [facility] 4:02 PM

## 2023-08-16 NOTE — ED Notes (Signed)
Crushed some tylenol in apple sauce and administered it to pt.

## 2023-08-16 NOTE — ED Notes (Signed)
[facility] transport setup for pt

## 2023-08-17 DIAGNOSIS — S72011A Unspecified intracapsular fracture of right femur, initial encounter for closed fracture: Secondary | ICD-10-CM | POA: Diagnosis not present

## 2023-08-17 LAB — TYPE AND SCREEN
ABO/RH(D): O NEG
Antibody Screen: NEGATIVE

## 2023-08-17 LAB — SURGICAL PCR SCREEN
MRSA, PCR: NEGATIVE
Staphylococcus aureus: NEGATIVE

## 2023-08-17 MED ORDER — TRANEXAMIC ACID 1000 MG/10ML IV SOLN
2000.0000 mg | INTRAVENOUS | Status: DC
  Filled 2023-08-17: qty 20

## 2023-08-17 NOTE — Progress Notes (Signed)
  Progress Note   Patient: Elizabeth Tucker WUJ:[id number] DOB: 24-Nov-1945 DOA: 08/16/2023     1 DOS: the patient was seen and examined on 08/17/2023   Brief hospital course: No notes on file  Assessment and Plan: Fall at home w/ R femur fracture  - NPO for surgery on Fri 08/18/2023 w/ Dr. Roda Shutters from orthopedic surgery - Tylenol PRN  - Celebrex 200 mg PO bid  - IV D5-LR 20K+ @ 75 cc/hr  - IV morphine 1 mg q1 hr PRN   Dementia with behavioral disturbance (HCC) - Haldol 2 mg PO bid      Subjective: Pt seen and examined at the bedside. Pt's pain seems to be under control. She is planned for surgery tmr with Dr. Roda Shutters from orthopedic's.   Physical Exam: Vitals:   08/16/23 1921 08/16/23 2043 08/17/23 0646 08/17/23 0951  BP:  116/77 92/64 93/67   Pulse:  79 66 71  Resp:  16 15   Temp: 97.9 F (36.6 C) 98.5 F (36.9 C) 97.8 F (36.6 C)   TempSrc: Oral Axillary Axillary   SpO2:  94% 96% 99%   Physical Exam HENT:     Head: Normocephalic.     Mouth/Throat:     Mouth: Mucous membranes are moist.  Cardiovascular:     Rate and Rhythm: Normal rate and regular rhythm.  Pulmonary:     Effort: Pulmonary effort is normal.  Abdominal:     Palpations: Abdomen is soft.  Musculoskeletal:     Cervical back: Neck supple.     Comments: Decreased ROM R hip   Skin:    General: Skin is warm.  Neurological:     Mental Status: She is alert. Mental status is at baseline.  Psychiatric:        Mood and Affect: Mood normal.       Disposition: Status is: Inpatient Remains inpatient appropriate because: Surgery on 08/18/2023  Planned Discharge Destination:  Dispo per pt's clinical progress     Time spent: 35 minutes  Author: Baron Hamper , MD 08/17/2023 2:43 PM  For on call review [email or web address].

## 2023-08-17 NOTE — Plan of Care (Signed)
  Problem: Clinical Measurements: Goal: Ability to maintain clinical measurements within normal limits will improve Outcome: Progressing Goal: Will remain free from infection Outcome: Progressing Goal: Diagnostic test results will improve Outcome: Progressing Goal: Respiratory complications will improve Outcome: Progressing Goal: Cardiovascular complication will be avoided Outcome: Progressing   Problem: Activity: Goal: Risk for activity intolerance will decrease Outcome: Progressing   Problem: Coping: Goal: Level of anxiety will decrease Outcome: Progressing   Problem: Elimination: Goal: Will not experience complications related to bowel motility Outcome: Progressing Goal: Will not experience complications related to urinary retention Outcome: Progressing   Problem: Pain Management: Goal: General experience of comfort will improve Outcome: Progressing   Problem: Safety: Goal: Ability to remain free from injury will improve Outcome: Progressing   Problem: Skin Integrity: Goal: Risk for impaired skin integrity will decrease Outcome: Progressing

## 2023-08-17 NOTE — Progress Notes (Signed)
[facility] [phone] [facility] Patient Visit  [name] is a current hospice patient, with hospice diagnosis of dementia in other diseases classified elsewhere, unspecified severity, with agitation. She was admitted 08/16/23 with diagnosis of. Closed subcapital fracture of neck of femur, right. [facility] was notified upon her arrival to ED. Per Dr. Patric Dykes, hospice physician, this is a related hospital admission.   Visited with patient in hospital. She is resting quietly, appears to be sleeping. I did not wake her. Noted plan for surgical fixation of fracture tentatively scheduled for tomorrow. I attempted to contact Latoya by phone and voicemail was left.    Patient is GIP appropriate due to need for IV fluids, surgical fixation of fracture, and close monitoring given recent fall with fractured hip.   Vital Signs: 97.8/66/15   92/62    O2 96% on RA  I&O: not documented  Abnormal labs: Plat 108, PT 15.3  Diagnostics:  CT cervical spine and CT chest without significant findings.  CT Head: IMPRESSION: 1. No acute intracranial abnormality or significant interval change. 2. Minimal soft tissue swelling along the lateral aspect of the right orbit without underlying fracture or foreign body. 3. Bilateral mastoid effusions, right greater than left. No obstructing nasopharyngeal lesion is present.  Xray Left ankle: MPRESSION: *Mildly displaced fracture of the proximal portion of the proximal phalanx of fifth toe.  Xray Left foot: IMPRESSION: *Mildly displaced fracture of the proximal portion of the proximal phalanx of fifth toe. Xray Hip: IMPRESSION: 1. Subcapital RIGHT femoral fracture with shortened appearance of the proximal right femur. 2. Left hip arthroplasty without evidence of hardware complication.  IV/PRN Meds: D5LR with 20 KCL at 52ml/hr    Assessment and Plan: Fall at home, initial encounter This was unwitnessed.  Happened  sometime overnight last night.  Subsequently noticed to have left foot erythema where displaced fracture of the proximal portion of the fifth toe phalanx has been discovered.  Further found to have right subcapital femoral fracture.  Case has been discussed with Dr. Roda Shutters as well as with the goddaughter by myself.  Patient is felt to be a candidate for palliative surgery for pain reduction.  I will order DVT prophylaxis.  Transfer the patient to [facility].  I will order multimodal pain regimen as well.   Dementia with behavioral disturbance (HCC) Curently calm . Advanced dementia non verbal. C.w. haloperidol and trazodone. Wean as able.   Discharge Planning: Ongoing  Family Contact: Voicemail left in attempt to speak with Primary Caregiver, Glee Arvin  IDT: Updated  Goals of Care: DNR  Glenna Fellows BSN, RN, [facility] hospital liaison [phone]

## 2023-08-17 NOTE — Evaluation (Signed)
Clinical/Bedside Swallow Evaluation Patient Details  Name: Elizabeth Tucker MRN: [id number] Date of Birth: 07-10-1946  Today's Date: 08/17/2023 Time: SLP Start Time (ACUTE ONLY): 1020 SLP Stop Time (ACUTE ONLY): 1045 SLP Time Calculation (min) (ACUTE ONLY): 25 min  Past Medical History:  Past Medical History:  Diagnosis Date   Alzheimer disease (HCC)    Complication of anesthesia    unknown, pt has a hx of dementia   GERD (gastroesophageal reflux disease)    Hepatitis    Hepatitis C    Past Surgical History:  Past Surgical History:  Procedure Laterality Date   CESAREAN SECTION     ELBOW SURGERY     TOTAL HIP ARTHROPLASTY Left 01/19/2021   Procedure: TOTAL HIP ARTHROPLASTY ANTERIOR APPROACH;  Surgeon: Kathryne Hitch, MD;  Location: [facility] OR;  Service: Orthopedics;  Laterality: Left;   HPI:  Elizabeth Tucker is a 77 y.o. female with medical history significant of dementia. .  She is able to get up at least with assistance and ambulate a bit.  HAd a fall in the night, found to be in pain when attempting to ambulate. There is no report of any fever loss of consciousness tremor any other trauma.  History obtained from patient's goddaughter.   Patient has no surviving spouse or children.  Found to have a right femoral fracture, plan for ORIF Friday.    Assessment / Plan / Recommendation  Clinical Impression  Pt awake and greets me verbally on arrival. Full breakfast tray next to pt, untouched, mostly cold. SLP sat pt up and offered straw sips of juice, which she accepted without signs of aspiration or difficulty; consecutive sips. Pt is edentulous, but self fed a peeled banana and masticated pancake with her gums. Did not offer sausage. Pt can continue a regular diet, but will neet some light assist with meals and may not tolerate whole meats. No SLP f/u needed, will sign off. SLP Visit Diagnosis: Dysphagia, unspecified (R13.10)    Aspiration Risk  Mild aspiration risk    Diet  Recommendation Regular;Thin liquid    Liquid Administration via: Cup;Straw Supervision: Staff to assist with self feeding Compensations: Slow rate;Small sips/bites Postural Changes: Seated upright at 90 degrees    Other  Recommendations      Recommendations for follow up therapy are one component of a multi-disciplinary discharge planning process, led by the attending physician.  Recommendations may be updated based on patient status, additional functional criteria and insurance authorization.  Follow up Recommendations        Assistance Recommended at Discharge    Functional Status Assessment    Frequency and Duration            Prognosis        Swallow Study   General HPI: Elizabeth Tucker is a 77 y.o. female with medical history significant of dementia.  Patient is at baseline not verbal.  However she is able to get up at least with assistance and ambulate a bit.  HAd a fall in the night, found to be in pain when attempting to ambulate. There is no report of any fever loss of consciousness tremor any other trauma.  History obtained from patient's goddaughter.   Patient has no surviving spouse or children.  Found to have a right femoral fracture, plan for ORIF Friday. Type of Study: Bedside Swallow Evaluation Diet Prior to this Study: Regular;Thin liquids (Level 0) Temperature Spikes Noted: No Respiratory Status: Room air History of Recent Intubation: No Behavior/Cognition:  Alert;Cooperative;Pleasant mood;Confused Oral Cavity Assessment: Dry;Dried secretions Oral Care Completed by SLP: Yes Oral Cavity - Dentition: Edentulous Vision: Functional for self-feeding Self-Feeding Abilities: Able to feed self Patient Positioning: Upright in bed Baseline Vocal Quality: Normal Volitional Cough: Cognitively unable to elicit Volitional Swallow: Unable to elicit    Oral/Motor/Sensory Function Overall Oral Motor/Sensory Function: Within functional limits   Ice Chips     Thin Liquid  Thin Liquid: Within functional limits Presentation: Straw    Nectar Thick Nectar Thick Liquid: Not tested   Honey Thick Honey Thick Liquid: Not tested   Puree Puree: Within functional limits   Solid     Solid: Within functional limits      Layani Foronda, Riley Nearing 08/17/2023,11:29 AM

## 2023-08-18 ENCOUNTER — Inpatient Hospital Stay (HOSPITAL_COMMUNITY)

## 2023-08-18 ENCOUNTER — Encounter (HOSPITAL_COMMUNITY)
Admission: EM | Disposition: A | Discharge status: Skilled Nursing Facility | Payer: Self-pay | Source: Skilled Nursing Facility | Attending: Internal Medicine

## 2023-08-18 ENCOUNTER — Other Ambulatory Visit: Payer: Self-pay

## 2023-08-18 ENCOUNTER — Inpatient Hospital Stay (HOSPITAL_COMMUNITY): Admitting: Anesthesiology

## 2023-08-18 ENCOUNTER — Encounter (HOSPITAL_COMMUNITY): Payer: Self-pay | Admitting: Internal Medicine

## 2023-08-18 DIAGNOSIS — S92502A Displaced unspecified fracture of left lesser toe(s), initial encounter for closed fracture: Secondary | ICD-10-CM

## 2023-08-18 DIAGNOSIS — S72011A Unspecified intracapsular fracture of right femur, initial encounter for closed fracture: Principal | ICD-10-CM

## 2023-08-18 DIAGNOSIS — S72001A Fracture of unspecified part of neck of right femur, initial encounter for closed fracture: Secondary | ICD-10-CM | POA: Diagnosis not present

## 2023-08-18 DIAGNOSIS — F0390 Unspecified dementia without behavioral disturbance: Secondary | ICD-10-CM | POA: Diagnosis not present

## 2023-08-18 HISTORY — PX: TOTAL HIP ARTHROPLASTY: SHX124

## 2023-08-18 LAB — CBC
HCT: 34.2 % — ABNORMAL LOW (ref 36.0–46.0)
Hemoglobin: 11.7 g/dL — ABNORMAL LOW (ref 12.0–15.0)
MCH: 30.8 pg (ref 26.0–34.0)
MCHC: 34.2 g/dL (ref 30.0–36.0)
MCV: 90 fL (ref 80.0–100.0)
Platelets: 94 10*3/uL — ABNORMAL LOW (ref 150–400)
RBC: 3.8 MIL/uL — ABNORMAL LOW (ref 3.87–5.11)
RDW: 14.1 % (ref 11.5–15.5)
WBC: 4.4 10*3/uL (ref 4.0–10.5)
nRBC: 0 % (ref 0.0–0.2)

## 2023-08-18 LAB — CREATININE, SERUM
Creatinine, Ser: 0.64 mg/dL (ref 0.44–1.00)
GFR, Estimated: >60 mL/min (ref >60)

## 2023-08-18 SURGERY — ARTHROPLASTY, HIP, TOTAL, ANTERIOR APPROACH
Anesthesia: Monitor Anesthesia Care | Site: Hip | Laterality: Right

## 2023-08-18 MED ORDER — CEFAZOLIN SODIUM-DEXTROSE 2-4 GM/100ML-% IV SOLN
2.0000 g | INTRAVENOUS | Status: AC
  Administered 2023-08-18: 2 g via INTRAVENOUS
  Filled 2023-08-18: qty 100

## 2023-08-18 MED ORDER — ONDANSETRON HCL 4 MG/2ML IJ SOLN: 4.0000 mg | Freq: Once | INTRAMUSCULAR | Status: DC | PRN

## 2023-08-18 MED ORDER — CHLORHEXIDINE GLUCONATE 0.12 % MT SOLN
OROMUCOSAL | Status: AC
  Administered 2023-08-18: 15 mL via OROMUCOSAL
  Filled 2023-08-18: qty 15

## 2023-08-18 MED ORDER — MENTHOL 3 MG MT LOZG: 1.0000 | LOZENGE | OROMUCOSAL | Status: DC | PRN

## 2023-08-18 MED ORDER — ENOXAPARIN SODIUM 40 MG/0.4ML IJ SOSY
40.0000 mg | PREFILLED_SYRINGE | INTRAMUSCULAR | Status: DC
  Administered 2023-08-19 – 2023-08-24 (×6): 40 mg via SUBCUTANEOUS
  Filled 2023-08-18 (×6): qty 0.4

## 2023-08-18 MED ORDER — ACETAMINOPHEN 325 MG PO TABS
325.0000 mg | ORAL_TABLET | ORAL | Status: DC | PRN
Start: 2023-08-18 — End: 2023-08-18

## 2023-08-18 MED ORDER — EPHEDRINE 5 MG/ML INJ
INTRAVENOUS | Status: AC
  Filled 2023-08-18: qty 5

## 2023-08-18 MED ORDER — LIDOCAINE 2% (20 MG/ML) 5 ML SYRINGE
INTRAMUSCULAR | Status: AC
  Filled 2023-08-18: qty 5

## 2023-08-18 MED ORDER — ONDANSETRON HCL 4 MG PO TABS: 4.0000 mg | ORAL_TABLET | Freq: Four times a day (QID) | ORAL | Status: DC | PRN

## 2023-08-18 MED ORDER — KETAMINE HCL 50 MG/5ML IJ SOSY
PREFILLED_SYRINGE | INTRAMUSCULAR | Status: DC | PRN
  Administered 2023-08-18: 5 mg via INTRAVENOUS
  Administered 2023-08-18: 10 mg via INTRAVENOUS

## 2023-08-18 MED ORDER — LACTATED RINGERS IV SOLN: INTRAVENOUS | Status: DC

## 2023-08-18 MED ORDER — SODIUM CHLORIDE 0.9 % IR SOLN
Status: DC | PRN
  Administered 2023-08-18: 3000 mL

## 2023-08-18 MED ORDER — HYDROCODONE-ACETAMINOPHEN 7.5-325 MG PO TABS
1.0000 | ORAL_TABLET | ORAL | Status: DC | PRN
  Administered 2023-08-18 – 2023-08-19 (×2): 1 via ORAL
  Filled 2023-08-18 (×2): qty 1

## 2023-08-18 MED ORDER — HYDROCODONE-ACETAMINOPHEN 5-325 MG PO TABS: 1.0000 | ORAL_TABLET | ORAL | Status: DC | PRN

## 2023-08-18 MED ORDER — PHENYLEPHRINE 80 MCG/ML (10ML) SYRINGE FOR IV PUSH (FOR BLOOD PRESSURE SUPPORT)
PREFILLED_SYRINGE | INTRAVENOUS | Status: AC
  Filled 2023-08-18: qty 10

## 2023-08-18 MED ORDER — MEPERIDINE HCL 25 MG/ML IJ SOLN
6.2500 mg | INTRAMUSCULAR | Status: DC | PRN
Start: 2023-08-18 — End: 2023-08-18

## 2023-08-18 MED ORDER — POLYETHYLENE GLYCOL 3350 17 G PO PACK: 17.0000 g | PACK | Freq: Every day | ORAL | Status: DC | PRN

## 2023-08-18 MED ORDER — OXYCODONE HCL 5 MG/5ML PO SOLN: 5.0000 mg | Freq: Once | ORAL | Status: DC | PRN

## 2023-08-18 MED ORDER — TRANEXAMIC ACID-NACL 1000-0.7 MG/100ML-% IV SOLN
1000.0000 mg | Freq: Once | INTRAVENOUS | Status: AC
  Administered 2023-08-18: 1000 mg via INTRAVENOUS
  Filled 2023-08-18: qty 100

## 2023-08-18 MED ORDER — PHENOL 1.4 % MT LIQD: 1.0000 | OROMUCOSAL | Status: DC | PRN

## 2023-08-18 MED ORDER — FENTANYL CITRATE (PF) 250 MCG/5ML IJ SOLN
INTRAMUSCULAR | Status: DC | PRN
  Administered 2023-08-18: 50 ug via INTRAVENOUS

## 2023-08-18 MED ORDER — TRANEXAMIC ACID-NACL 1000-0.7 MG/100ML-% IV SOLN
1000.0000 mg | INTRAVENOUS | Status: AC
  Administered 2023-08-18: 1000 mg via INTRAVENOUS
  Filled 2023-08-18: qty 100

## 2023-08-18 MED ORDER — ONDANSETRON HCL 4 MG/2ML IJ SOLN: 4.0000 mg | Freq: Four times a day (QID) | INTRAMUSCULAR | Status: DC | PRN

## 2023-08-18 MED ORDER — CHLORHEXIDINE GLUCONATE 0.12 % MT SOLN: 15.0000 mL | Freq: Once | OROMUCOSAL | Status: AC

## 2023-08-18 MED ORDER — OXYCODONE HCL 5 MG PO TABS: 5.0000 mg | ORAL_TABLET | Freq: Once | ORAL | Status: DC | PRN

## 2023-08-18 MED ORDER — ALBUMIN HUMAN 5 % IV SOLN: 12.5000 g | Freq: Once | INTRAVENOUS | Status: AC

## 2023-08-18 MED ORDER — POVIDONE-IODINE 10 % EX SWAB
2.0000 | Freq: Once | CUTANEOUS | Status: AC
  Administered 2023-08-18: 2 via TOPICAL

## 2023-08-18 MED ORDER — ENOXAPARIN SODIUM 40 MG/0.4ML IJ SOSY: 40.0000 mg | PREFILLED_SYRINGE | Freq: Every day | INTRAMUSCULAR | 0 refills | Status: AC

## 2023-08-18 MED ORDER — ALUM & MAG HYDROXIDE-SIMETH 200-200-20 MG/5ML PO SUSP: 30.0000 mL | ORAL | Status: DC | PRN

## 2023-08-18 MED ORDER — VANCOMYCIN HCL 1 G IV SOLR
INTRAVENOUS | Status: DC | PRN
  Administered 2023-08-18: 1000 mg via TOPICAL

## 2023-08-18 MED ORDER — 0.9 % SODIUM CHLORIDE (POUR BTL) OPTIME
TOPICAL | Status: DC | PRN
  Administered 2023-08-18: 1000 mL

## 2023-08-18 MED ORDER — ROCURONIUM BROMIDE 10 MG/ML (PF) SYRINGE
PREFILLED_SYRINGE | INTRAVENOUS | Status: AC
  Filled 2023-08-18: qty 10

## 2023-08-18 MED ORDER — MAGNESIUM CITRATE PO SOLN: 1.0000 | Freq: Once | ORAL | Status: DC | PRN

## 2023-08-18 MED ORDER — FENTANYL CITRATE (PF) 250 MCG/5ML IJ SOLN
INTRAMUSCULAR | Status: AC
  Filled 2023-08-18: qty 5

## 2023-08-18 MED ORDER — BUPIVACAINE-MELOXICAM ER 400-12 MG/14ML IJ SOLN
INTRAMUSCULAR | Status: AC
  Filled 2023-08-18: qty 1

## 2023-08-18 MED ORDER — PHENYLEPHRINE HCL-NACL 20-0.9 MG/250ML-% IV SOLN
INTRAVENOUS | Status: DC | PRN
  Administered 2023-08-18: 10 ug/min via INTRAVENOUS

## 2023-08-18 MED ORDER — METHOCARBAMOL 1000 MG/10ML IJ SOLN: 500.0000 mg | Freq: Four times a day (QID) | INTRAMUSCULAR | Status: DC | PRN

## 2023-08-18 MED ORDER — VANCOMYCIN HCL 1000 MG IV SOLR
INTRAVENOUS | Status: AC
  Filled 2023-08-18: qty 20

## 2023-08-18 MED ORDER — HYDROCODONE-ACETAMINOPHEN 5-325 MG PO TABS: 1.0000 | ORAL_TABLET | Freq: Four times a day (QID) | ORAL | 0 refills | Status: AC | PRN

## 2023-08-18 MED ORDER — PHENYLEPHRINE 80 MCG/ML (10ML) SYRINGE FOR IV PUSH (FOR BLOOD PRESSURE SUPPORT)
PREFILLED_SYRINGE | INTRAVENOUS | Status: DC | PRN
  Administered 2023-08-18: 160 ug via INTRAVENOUS
  Administered 2023-08-18 (×2): 80 ug via INTRAVENOUS

## 2023-08-18 MED ORDER — FENTANYL CITRATE (PF) 100 MCG/2ML IJ SOLN: 25.0000 ug | INTRAMUSCULAR | Status: DC | PRN

## 2023-08-18 MED ORDER — SORBITOL 70 % SOLN: 30.0000 mL | Freq: Every day | Status: DC | PRN

## 2023-08-18 MED ORDER — MORPHINE SULFATE (PF) 2 MG/ML IV SOLN
0.5000 mg | INTRAVENOUS | Status: DC | PRN
Start: 2023-08-18 — End: 2023-08-18

## 2023-08-18 MED ORDER — ACETAMINOPHEN 500 MG PO TABS
500.0000 mg | ORAL_TABLET | Freq: Four times a day (QID) | ORAL | Status: AC
  Administered 2023-08-18 – 2023-08-19 (×4): 500 mg via ORAL
  Filled 2023-08-18 (×4): qty 1

## 2023-08-18 MED ORDER — CEFAZOLIN SODIUM-DEXTROSE 2-4 GM/100ML-% IV SOLN
2.0000 g | Freq: Four times a day (QID) | INTRAVENOUS | Status: AC
  Administered 2023-08-18 – 2023-08-19 (×3): 2 g via INTRAVENOUS
  Filled 2023-08-18 (×3): qty 100

## 2023-08-18 MED ORDER — ACETAMINOPHEN 325 MG PO TABS
325.0000 mg | ORAL_TABLET | Freq: Four times a day (QID) | ORAL | Status: DC | PRN
  Administered 2023-08-20: 650 mg via ORAL
  Filled 2023-08-18: qty 2

## 2023-08-18 MED ORDER — MIDAZOLAM HCL 2 MG/2ML IJ SOLN
INTRAMUSCULAR | Status: AC
  Filled 2023-08-18: qty 2

## 2023-08-18 MED ORDER — KETAMINE HCL 50 MG/5ML IJ SOSY
PREFILLED_SYRINGE | INTRAMUSCULAR | Status: AC
  Filled 2023-08-18: qty 5

## 2023-08-18 MED ORDER — DOCUSATE SODIUM 100 MG PO CAPS
100.0000 mg | ORAL_CAPSULE | Freq: Two times a day (BID) | ORAL | Status: DC
Start: 2023-08-18 — End: 2023-08-24
  Administered 2023-08-18 – 2023-08-24 (×11): 100 mg via ORAL
  Filled 2023-08-18 (×11): qty 1

## 2023-08-18 MED ORDER — EPHEDRINE SULFATE-NACL 50-0.9 MG/10ML-% IV SOSY
PREFILLED_SYRINGE | INTRAVENOUS | Status: DC | PRN
  Administered 2023-08-18: 5 mg via INTRAVENOUS

## 2023-08-18 MED ORDER — ACETAMINOPHEN 160 MG/5ML PO SOLN: 325.0000 mg | ORAL | Status: DC | PRN

## 2023-08-18 MED ORDER — PRONTOSAN WOUND IRRIGATION OPTIME
TOPICAL | Status: DC | PRN
  Administered 2023-08-18: 350 mL via TOPICAL

## 2023-08-18 MED ORDER — PROPOFOL 10 MG/ML IV BOLUS
INTRAVENOUS | Status: AC
  Filled 2023-08-18: qty 20

## 2023-08-18 MED ORDER — BUPIVACAINE IN DEXTROSE 0.75-8.25 % IT SOLN
INTRATHECAL | Status: DC | PRN
  Administered 2023-08-18: 1.7 mL via INTRATHECAL

## 2023-08-18 MED ORDER — ALBUMIN HUMAN 5 % IV SOLN
INTRAVENOUS | Status: AC
  Administered 2023-08-18: 12.5 g via INTRAVENOUS
  Filled 2023-08-18: qty 250

## 2023-08-18 MED ORDER — METHOCARBAMOL 500 MG PO TABS
500.0000 mg | ORAL_TABLET | Freq: Four times a day (QID) | ORAL | Status: DC | PRN
  Administered 2023-08-19: 500 mg via ORAL
  Filled 2023-08-18 (×2): qty 1

## 2023-08-18 MED ORDER — ORAL CARE MOUTH RINSE: 15.0000 mL | Freq: Once | OROMUCOSAL | Status: AC

## 2023-08-18 MED ORDER — TRANEXAMIC ACID-NACL 1000-0.7 MG/100ML-% IV SOLN
INTRAVENOUS | Status: AC
  Filled 2023-08-18: qty 100

## 2023-08-18 SURGICAL SUPPLY — 56 items
BAG COUNTER SPONGE SURGICOUNT (BAG) ×2 IMPLANT
BAG DECANTER FOR FLEXI CONT (MISCELLANEOUS) ×2 IMPLANT
BIPOLAR DEPUY 48 (Hips) ×1 IMPLANT
BLADE SAG 18X100X1.27 (BLADE) ×2 IMPLANT
COVER PERINEAL POST (MISCELLANEOUS) ×2 IMPLANT
COVER SURGICAL LIGHT HANDLE (MISCELLANEOUS) ×2 IMPLANT
DERMABOND ADVANCED .7 DNX12 (GAUZE/BANDAGES/DRESSINGS) IMPLANT
DRAPE C-ARM 42X72 X-RAY (DRAPES) ×2 IMPLANT
DRAPE POUCH INSTRU U-SHP 10X18 (DRAPES) ×2 IMPLANT
DRAPE STERI IOBAN 125X83 (DRAPES) ×2 IMPLANT
DRAPE U-SHAPE 47X51 STRL (DRAPES) ×4 IMPLANT
DRSG AQUACEL AG ADV 3.5X10 (GAUZE/BANDAGES/DRESSINGS) ×2 IMPLANT
DURAPREP 26ML APPLICATOR (WOUND CARE) ×4 IMPLANT
ELECT BLADE 4.0 EZ CLEAN MEGAD (MISCELLANEOUS) ×1
ELECT REM PT RETURN 9FT ADLT (ELECTROSURGICAL) ×1
ELECTRODE BLDE 4.0 EZ CLN MEGD (MISCELLANEOUS) ×2 IMPLANT
ELECTRODE REM PT RTRN 9FT ADLT (ELECTROSURGICAL) ×2 IMPLANT
GLOVE BIO SURGEON STRL SZ7.5 (GLOVE) IMPLANT
GLOVE BIOGEL PI IND STRL 7.0 (GLOVE) ×4 IMPLANT
GLOVE BIOGEL PI IND STRL 7.5 (GLOVE) ×10 IMPLANT
GLOVE ECLIPSE 7.0 STRL STRAW (GLOVE) ×4 IMPLANT
GLOVE INDICATOR 8.0 STRL GRN (GLOVE) IMPLANT
GLOVE SKINSENSE STRL SZ7.5 (GLOVE) ×2 IMPLANT
GLOVE SURG SYN 7.5 E (GLOVE) ×2 IMPLANT
GLOVE SURG SYN 7.5 PF PI (GLOVE) ×4 IMPLANT
GLOVE SURG UNDER POLY LF SZ7 (GLOVE) ×6 IMPLANT
GLOVE SURG UNDER POLY LF SZ7.5 (GLOVE) ×4 IMPLANT
GOWN STRL REUS W/ TWL LRG LVL3 (GOWN DISPOSABLE) IMPLANT
GOWN STRL REUS W/ TWL XL LVL3 (GOWN DISPOSABLE) ×2 IMPLANT
GOWN STRL SURGICAL XL XLNG (GOWN DISPOSABLE) ×2 IMPLANT
GOWN TOGA ZIPPER T7+ PEEL AWAY (MISCELLANEOUS) ×4 IMPLANT
HEAD BIPOLAR DEPUY 48 (Hips) IMPLANT
HEAD FEM STD 28X+8.5 STRL (Hips) IMPLANT
HOOD PEEL AWAY T7 (MISCELLANEOUS) ×2 IMPLANT
IV NS IRRIG 3000ML ARTHROMATIC (IV SOLUTION) ×2 IMPLANT
KIT BASIN OR (CUSTOM PROCEDURE TRAY) ×2 IMPLANT
MARKER SKIN DUAL TIP RULER LAB (MISCELLANEOUS) ×2 IMPLANT
NDL SPNL 18GX3.5 QUINCKE PK (NEEDLE) ×2 IMPLANT
NEEDLE SPNL 18GX3.5 QUINCKE PK (NEEDLE) ×1 IMPLANT
PACK TOTAL JOINT (CUSTOM PROCEDURE TRAY) ×2 IMPLANT
PACK UNIVERSAL I (CUSTOM PROCEDURE TRAY) ×2 IMPLANT
SET HNDPC FAN SPRY TIP SCT (DISPOSABLE) ×2 IMPLANT
SOLUTION PRONTOSAN WOUND 350ML (IRRIGATION / IRRIGATOR) ×2 IMPLANT
STAPLER VISISTAT 35W (STAPLE) IMPLANT
STEM SUMMIT PRESSFIT HIP SZ7 (Hips) IMPLANT
SUT ETHIBOND 2 V 37 (SUTURE) ×2 IMPLANT
SUT ETHILON 2 0 FS 18 (SUTURE) IMPLANT
SUT VIC AB 0 CT1 27XBRD ANBCTR (SUTURE) ×2 IMPLANT
SUT VIC AB 1 CTX36XBRD ANBCTR (SUTURE) ×2 IMPLANT
SUT VIC AB 2-0 CT1 TAPERPNT 27 (SUTURE) ×4 IMPLANT
SYR 50ML LL SCALE MARK (SYRINGE) ×2 IMPLANT
TOWEL GREEN STERILE (TOWEL DISPOSABLE) ×2 IMPLANT
TRAY CATH INTERMITTENT SS 16FR (CATHETERS) IMPLANT
TRAY FOLEY W/BAG SLVR 16FR ST (SET/KITS/TRAYS/PACK) IMPLANT
TUBE SUCT ARGYLE STRL (TUBING) ×2 IMPLANT
YANKAUER SUCT BULB TIP NO VENT (SUCTIONS) ×2 IMPLANT

## 2023-08-18 NOTE — TOC CAGE-AID Note (Signed)
Transition of Care [facility]) - CAGE-AID Screening   Patient Details  Name: Elizabeth Tucker MRN: [id number] Date of Birth: [birth year]  Transition of Care [facility]) CM/SW Contact:    Janora Norlander, RN Phone Number: [phone] 08/18/2023, 5:38 PM   Clinical Narrative: Pt unable to complete screening due to confusion and baseline dementia.    CAGE-AID Screening: Substance Abuse Screening unable to be completed due to: : Patient unable to participate

## 2023-08-18 NOTE — H&P (Signed)
PREOPERATIVE H&P  Chief Complaint: FALL  HPI: Elizabeth Tucker is a 77 y.o. female who presents for surgical treatment of FALL.  She denies any changes in medical history.  Past Surgical History:  Procedure Laterality Date   CESAREAN SECTION     ELBOW SURGERY     TOTAL HIP ARTHROPLASTY Left 01/19/2021   Procedure: TOTAL HIP ARTHROPLASTY ANTERIOR APPROACH;  Surgeon: Kathryne Hitch, MD;  Location: [facility] OR;  Service: Orthopedics;  Laterality: Left;   Social History   Socioeconomic History   Marital status: Single    Spouse name: Not on file   Number of children: Not on file   Years of education: Not on file   Highest education level: Not on file  Occupational History   Not on file  Tobacco Use   Smoking status: Never   Smokeless tobacco: Never  Vaping Use   Vaping status: Never Used  Substance and Sexual Activity   Alcohol use: No   Drug use: No   Sexual activity: Not on file  Other Topics Concern   Not on file  Social History Narrative   Tobacco use, amount per day now: past smoker, not now.   Past tobacco use, amount per day:   How many years did you use tobacco:   Alcohol use (drinks per week): casual alcohol use, sparse.   Diet:   Do you drink/eat things with caffeine: Yes, not often.   Marital status:   Previously married                               What year were you married?   Do you live in a house, apartment, assisted living, condo, trailer, etc.? Assisted living facility, [facility].   Is it one or more stories? 4 stories.   How many persons live in your home? N/a other residents, but stays in single room.   Do you have pets in your home?( please list) n/a.   Highest Level of education completed? High School.   Current or past profession: Social research officer, government, security guard.   Do you exercise?                                  Type and how often?   Do you have a living will? No.   Do you have a DNR form?  Yes                                 If not,  do you want to discuss one?   Do you have signed POA/HPOA forms?                        If so, please bring to you appointment      Do you have any difficulty bathing or dressing yourself? No   Do you have any difficulty preparing food or eating? No   Do you have any difficulty managing your medications?   Do you have any difficulty managing your finances?   Do you have any difficulty affording your medications?    Social Drivers of Corporate investment banker Strain: Not on file  Food Insecurity: Not on file  Transportation Needs: Not on file  Physical Activity: Not on file  Stress:  Not on file  Social Connections: Not on file   Family History  Problem Relation Age of Onset   Dementia Mother    Liver disease Brother    Allergies  Allergen Reactions   Sulfonamide Derivatives Other (See Comments)    Childhood allergy    Prior to Admission medications   Medication Sig Start Date End Date Taking? Authorizing Provider  guaifenesin (ROBITUSSIN) 100 MG/5ML syrup Take 200 mg by mouth every 4 (four) hours as needed for cough.   Yes [provider]  haloperidol (HALDOL) 2 MG tablet Take 2 mg by mouth 2 (two) times daily.   Yes [provider]  miconazole (MICOTIN) 2 % powder Apply 1 Application topically in the morning and at bedtime. Prn order: 1 application every 8 hours as needed for rash  Apply to breast and umbilical folds   Yes [provider]  Propylene Glycol (SYSTANE BALANCE) 0.6 % SOLN Apply to eye.   Yes [provider]  traZODone (DESYREL) 50 MG tablet Take 50 mg by mouth at bedtime.   Yes [provider]  UNABLE TO FIND Take 1 Bottle by mouth in the morning, at noon, and at bedtime. Med Name: mighty shake   Yes [provider]  zinc oxide 20 % ointment Apply 1 Application topically See admin instructions. 1 application to buttocks and peri area every shift   Yes [provider]     Positive ROS: All other  systems have been reviewed and were otherwise negative with the exception of those mentioned in the HPI and as above.  Physical Exam: General: Alert, no acute distress Cardiovascular: No pedal edema Respiratory: No cyanosis, no use of accessory musculature GI: abdomen soft Skin: No lesions in the area of chief complaint Neurologic: Sensation intact distally Psychiatric: Patient is competent for consent with normal mood and affect Lymphatic: no lymphedema  MUSCULOSKELETAL: exam stable  Assessment: FALL  Plan: Plan for Procedure(s): TOTAL HIP ARTHROPLASTY ANTERIOR APPROACH  The risks benefits and alternatives were discussed with the patient including but not limited to the risks of nonoperative treatment, versus surgical intervention including infection, bleeding, nerve injury,  blood clots, cardiopulmonary complications, morbidity, mortality, among others, and they were willing to proceed.   Glee Arvin, MD 08/18/2023 9:50 AM

## 2023-08-18 NOTE — Progress Notes (Signed)
[facility] [phone] [facility] Patient Visit   Mr. Mario Yohn is a current hospice patient, with hospice diagnosis of dementia in other diseases classified elsewhere, unspecified severity, with agitation. She was admitted 12.25.24 with diagnosis of closed subcapital fracture of neck of femur, right. [facility] was notified upon her arrival to ED. Per Dr. Patric Dykes, hospice physician, this is a related hospital admission.    Visited with patient in hospital. She was awake and somewhat agitated. Alerted floor nurse who advised that patient is scheduled for surgery today pending return call from POA to sign consent. Plan for surgical fixation of fracture tentatively scheduled for today. I attempted to contact Latoya by phone and voicemail was left.     Patient is GIP appropriate due to need for IV fluids, surgical fixation of fracture, and close monitoring given recent fall with fractured hip.    Vital Signs: 98/86/14   110/78    O2 96% on RA   I&O: not documented   Abnormal labs: none new   Diagnostics: none new   IV/PRN Meds: IV D5-LR 20K+ @ 75 cc/hr   Problem list per Baron Hamper, MD 12.26.24: Assessment and Plan: Fall at home w/ R femur fracture  - NPO for surgery on Fri 08/18/2023 w/ Dr. Roda Shutters from orthopedic surgery - Tylenol PRN  - Celebrex 200 mg PO bid  - IV D5-LR 20K+ @ 75 cc/hr  - IV morphine 1 mg q1 hr PRN    Dementia with behavioral disturbance (HCC) - Haldol 2 mg PO bid   Discharge Planning: Ongoing   Family Contact: Voicemail left in attempt to speak with Primary Caregiver, Glee Arvin   IDT: Updated   Goals of Care: DNR   Henderson Newcomer, LPN [facility] Liaison [phone]

## 2023-08-18 NOTE — Op Note (Signed)
PARTIAL HIP ARTHROPLASTY ANTERIOR APPROACH  Procedure Note CURISSA GALUSKA   [id number]  Pre-op Diagnosis: Right displaced femoral neck fracture     Post-op Diagnosis: same   Operative Procedures  1. Prosthetic replacement for femoral neck fracture. CPT [phone]  Operative Findings 1. Acute subcapital femoral neck fracture  Personnel  Gershon Mussel, M.D.  ASSIST: Youlanda Roys, RNFA   Anesthesia: spinal  Prosthesis: Depuy Femur: Summit basic 7 Head: 48 mm size: +8.5 Bearing Type: bipolar  Hip Hemiarthroplasty (Anterior Approach) Op Note:  After informed consent was obtained and the operative extremity marked in the holding area, the patient was brought back to the operating room and placed supine on the HANA table. Next, the operative extremity was prepped and draped in normal sterile fashion. Surgical timeout occurred verifying patient identification, surgical site, surgical procedure and administration of antibiotics.  A 10 cm longitudinal incision was made starting from 2 fingerbreadths lateral and inferior to the ASIS towards the lateral aspect of the patella.  A Hueter approach to the hip was performed, using the interval between tensor fascia lata and sartorius.  Dissection was carried bluntly down onto the anterior hip capsule. The lateral femoral circumflex vessels were identified and coagulated. A capsulotomy was performed and fracture hematoma was evacuated and the capsular flaps tagged for later repair.  The labrum was left intact.  The neck osteotomy was performed below the fracture about 1 fingerbreadth above the lesser trochanter. The femoral head was removed and found a 48 mm head was the appropriate fit.    We then turned our attention to the femur.  After placing the femoral hook, the leg was taken to externally rotated, extended and adducted position taking care to perform soft tissue releases to allow for adequate mobilization of the femur. Soft tissue was cleared from the  shoulder of the greater trochanter and the hook elevator used to improve exposure of the proximal femur. Sequential broaching performed up to a size 7. Trial neck and head were placed. The leg was brought back up to neutral and the construct reduced. The position and sizing of components, offset and leg lengths were checked using fluoroscopy. Stability of the construct was checked in extension and external rotation without any subluxation or impingement of prosthesis. We dislocated the prosthesis, dropped the leg back into position, removed trial components, and irrigated copiously. The final stem and head was then placed, the leg brought back up, the system reduced and fluoroscopy used to verify positioning.  We irrigated, obtained hemostasis and closed the capsule using #2 ethibond suture.  The fascia was closed with #1 vicryl plus, the deep fat layer was closed with 0 vicryl, the subcutaneous layers closed with 2.0 Vicryl Plus and the skin closed with 2.0 nylon. A sterile dressing was applied. The patient was awakened in the operating room and taken to recovery in stable condition. All sponge, needle, and instrument counts were correct at the end of the case.   Tessa Lerner, my PA, was necessary for opening, closing, exposing, retracting, limb positioning and overall facilitation and completion of the surgery.  Position: supine  Complications: see description of procedure.  Time Out: performed   Drains/Packing: none  Estimated blood loss: see anesthesia record  Returned to Recovery Room: in good condition.   Antibiotics: yes   Mechanical VTE (DVT) Prophylaxis: sequential compression devices, TED thigh-high  Chemical VTE (DVT) Prophylaxis: lovenox  Fluid Replacement: Crystalloid: see anesthesia record  Specimens Removed: 1 to pathology   Sponge and  Instrument Count Correct? yes   PACU: portable radiograph - low AP   Admission: inpatient status, start PT & OT POD#1  Plan/RTC:  Return in 2 weeks for staple removal. Return in 6 weeks to see MD.  Weight Bearing/Load Lower Extremity: full  Hip precautions: none  N. Glee Arvin, MD [facility] 1:42 PM   Implant Name Type Inv. Item Serial No. Manufacturer Lot No. LRB No. Used Action  STEM SUMMIT PRESSFIT HIP SZ7 - [id number] Hips STEM SUMMIT PRESSFIT HIP SZ7  [employer] [id number] Right 1 Implanted  HEAD FEM STD 28X+8.5 STRL - [id number] Hips HEAD FEM STD 28X+8.5 STRL  [employer] [id number] Right 1 Implanted  BIPOLAR DEPUY 48 - [id number] Hips BIPOLAR DEPUY 48  [employer] [id number] Right 1 Implanted

## 2023-08-18 NOTE — Consult Note (Addendum)
ORTHOPAEDIC CONSULTATION  REQUESTING PHYSICIAN: Barnetta Chapel, MD  Chief Complaint: Right femoral neck fracture, left 5th toe fx  HPI:  Elizabeth Tucker is a 77 y.o. female with medical history significant of dementia.  Patient is at baseline not verbal.  And unable to provide history.  However she is able to get up at least with assistance and ambulate a bit.  Patient seems to have been in her usual state of health till late last evening.  At some point overnight she seems to have had a fall.  When patient was attempted to be ambulated earlier this morning, patient was unable to bear weight on right lower extremity.  Patient sent to the ER.   There is no report of any fever loss of consciousness tremor any other trauma.  History obtained from patient's goddaughter with Elizabeth Tucker.  Patient has no surviving spouse or children.  Also her other listed contact the sister has passed away.  The goddaughter is the acting power of attorney.  Per report given to be by the car daughter. Ortho consulted for surgical evaluation.  Past Medical History:  Diagnosis Date   Alzheimer disease (HCC)    Complication of anesthesia    unknown, pt has a hx of dementia   GERD (gastroesophageal reflux disease)    Hepatitis    Hepatitis C    Past Surgical History:  Procedure Laterality Date   CESAREAN SECTION     ELBOW SURGERY     TOTAL HIP ARTHROPLASTY Left 01/19/2021   Procedure: TOTAL HIP ARTHROPLASTY ANTERIOR APPROACH;  Surgeon: Kathryne Hitch, MD;  Location: [facility] OR;  Service: Orthopedics;  Laterality: Left;   Social History   Socioeconomic History   Marital status: Single    Spouse name: Not on file   Number of children: Not on file   Years of education: Not on file   Highest education level: Not on file  Occupational History   Not on file  Tobacco Use   Smoking status: Never   Smokeless tobacco: Never  Vaping Use   Vaping status: Never Used  Substance and Sexual Activity    Alcohol use: No   Drug use: No   Sexual activity: Not on file  Other Topics Concern   Not on file  Social History Narrative   Tobacco use, amount per day now: past smoker, not now.   Past tobacco use, amount per day:   How many years did you use tobacco:   Alcohol use (drinks per week): casual alcohol use, sparse.   Diet:   Do you drink/eat things with caffeine: Yes, not often.   Marital status:   Previously married                               What year were you married?   Do you live in a house, apartment, assisted living, condo, trailer, etc.? Assisted living facility, [facility].   Is it one or more stories? 4 stories.   How many persons live in your home? N/a other residents, but stays in single room.   Do you have pets in your home?( please list) n/a.   Highest Level of education completed? High School.   Current or past profession: Social research officer, government, security guard.   Do you exercise?  Type and how often?   Do you have a living will? No.   Do you have a DNR form?  Yes                                 If not, do you want to discuss one?   Do you have signed POA/HPOA forms?                        If so, please bring to you appointment      Do you have any difficulty bathing or dressing yourself? No   Do you have any difficulty preparing food or eating? No   Do you have any difficulty managing your medications?   Do you have any difficulty managing your finances?   Do you have any difficulty affording your medications?    Social Drivers of Corporate investment banker Strain: Not on file  Food Insecurity: Not on file  Transportation Needs: Not on file  Physical Activity: Not on file  Stress: Not on file  Social Connections: Not on file   Family History  Problem Relation Age of Onset   Dementia Mother    Liver disease Brother    Allergies  Allergen Reactions   Sulfonamide Derivatives Other (See Comments)    Childhood allergy     Prior to Admission medications   Medication Sig Start Date End Date Taking? Authorizing Provider  guaifenesin (ROBITUSSIN) 100 MG/5ML syrup Take 200 mg by mouth every 4 (four) hours as needed for cough.   Yes [provider]  haloperidol (HALDOL) 2 MG tablet Take 2 mg by mouth 2 (two) times daily.   Yes [provider]  miconazole (MICOTIN) 2 % powder Apply 1 Application topically in the morning and at bedtime. Prn order: 1 application every 8 hours as needed for rash  Apply to breast and umbilical folds   Yes [provider]  Propylene Glycol (SYSTANE BALANCE) 0.6 % SOLN Apply to eye.   Yes [provider]  traZODone (DESYREL) 50 MG tablet Take 50 mg by mouth at bedtime.   Yes [provider]  UNABLE TO FIND Take 1 Bottle by mouth in the morning, at noon, and at bedtime. Med Name: mighty shake   Yes [provider]  zinc oxide 20 % ointment Apply 1 Application topically See admin instructions. 1 application to buttocks and peri area every shift   Yes [provider]   CT Cervical Spine Wo Contrast Result Date: 08/16/2023 CLINICAL DATA:  Mechanical fall yesterday.  Toe fracture. EXAM: CT CERVICAL SPINE WITHOUT CONTRAST TECHNIQUE: Multidetector CT imaging of the cervical spine was performed without intravenous contrast. Multiplanar CT image reconstructions were also generated. RADIATION DOSE REDUCTION: This exam was performed according to the departmental dose-optimization program which includes automated exposure control, adjustment of the mA and/or kV according to patient size and/or use of iterative reconstruction technique. COMPARISON:  CT of the cervical spine 01/19/2021 FINDINGS: Alignment: Slight degenerative retrolisthesis at C3-4 and C4-5 is stable. Exaggerated cervical lordosis and upper thoracic kyphosis is noted. Slight anterolisthesis at C7-T1 is noted. Skull base and vertebrae: Superior endplate Schmorl's noted T1 is stable.  Chronic endplate degenerative changes at C4-5 are stable. No acute fracture is present. Soft tissues and spinal canal: No prevertebral fluid or swelling. No visible canal hematoma. Disc levels: The lung apices are clear. Uncovertebral spurring leads to  moderate foraminal stenosis bilaterally at C4-5 and C6-7. Upper chest: The lung apices are clear. The thoracic inlet is within normal limits. IMPRESSION: 1. No acute fracture or traumatic subluxation. 2. Stable degenerative changes of the cervical spine. 3. Moderate foraminal stenosis bilaterally at C4-5 and C6-7. Electronically Signed   By: Marin Roberts M.D.   On: 08/16/2023 15:39   CT Head Wo Contrast Result Date: 08/16/2023 CLINICAL DATA:  Mechanical fall yesterday.  Toe fracture. EXAM: CT HEAD WITHOUT CONTRAST TECHNIQUE: Contiguous axial images were obtained from the base of the skull through the vertex without intravenous contrast. RADIATION DOSE REDUCTION: This exam was performed according to the departmental dose-optimization program which includes automated exposure control, adjustment of the mA and/or kV according to patient size and/or use of iterative reconstruction technique. COMPARISON:  CT head without contrast 01/19/2021 FINDINGS: Brain: Mild periventricular and subcortical white matter hypoattenuation is similar the prior exam. No acute infarct, hemorrhage, or mass lesion is present. Deep brain nuclei are within normal limits. The ventricles are of normal size. No significant extraaxial fluid collection is present. The brainstem and cerebellum are within normal limits. Midline structures are within normal limits. Vascular: No hyperdense vessel or unexpected calcification. Skull: Calvarium is intact. No focal lytic or blastic lesions are present. Minimal soft tissue swelling is noted along the lateral aspect of the right orbit. No underlying fracture or foreign body is present. The extracranial soft tissues are otherwise within normal  limits. Sinuses/Orbits: Bilateral mastoid effusions are present, right greater than left. No obstructing nasopharyngeal lesion is present. The paranasal sinuses are otherwise clear. The globes and orbits are within normal limits. IMPRESSION: 1. No acute intracranial abnormality or significant interval change. 2. Minimal soft tissue swelling along the lateral aspect of the right orbit without underlying fracture or foreign body. 3. Bilateral mastoid effusions, right greater than left. No obstructing nasopharyngeal lesion is present. Electronically Signed   By: Marin Roberts M.D.   On: 08/16/2023 15:35   DG Chest Port 1 View Result Date: 08/16/2023 CLINICAL DATA:  Fall. EXAM: PORTABLE CHEST 1 VIEW COMPARISON:  Chest radiograph dated January 21, 2021 FINDINGS: The heart size and mediastinal contours are within normal limits. Aortic atherosclerosis. No focal consolidation, pleural effusion, or pneumothorax. Diffusely decreased osseous mineralization. Suspected compression deformity of the T11 vertebral body is faintly visualized on this exam and appears new since the prior exam dated January 21, 2021. No obvious displaced rib fracture identified. IMPRESSION: 1. Suspected age-indeterminate compression deformity of the T11 vertebral body is faintly visualized and appears new since the prior exam dated January 21, 2021. Recommend correlation with physical exam/point tenderness. Consider further evaluation with CT thoracic spine. 2. No acute cardiopulmonary findings. Electronically Signed   By: Hart Robinsons M.D.   On: 08/16/2023 14:17   DG Hip Unilat W or Wo Pelvis 2-3 Views Left Result Date: 08/16/2023 CLINICAL DATA:  Status post fall with inability to bear weight on the left leg EXAM: DG HIP (WITH OR WITHOUT PELVIS) 3V LEFT COMPARISON:  Left hip radiographs dated 02/23/2021 FINDINGS: Left hip arthroplasty. Hardware appears intact and well seated. No abnormal surrounding lucency. Subcapital right femoral fracture  with shortened appearance of the proximal right femur. No evidence of hip dislocation. IMPRESSION: 1. Subcapital RIGHT femoral fracture with shortened appearance of the proximal right femur. 2. Left hip arthroplasty without evidence of hardware complication. Electronically Signed   By: Agustin Cree M.D.   On: 08/16/2023 13:18   DG Foot Complete Left Result Date:  08/16/2023 CLINICAL DATA:  Fall.  Unable to bear weight. EXAM: LEFT ANKLE COMPLETE - 3+ VIEW; LEFT FOOT - COMPLETE 3+ VIEW COMPARISON:  None Available. FINDINGS: There is diffuse osteopenia of the visualized osseous structures. There is mildly displaced fracture of the proximal portion of the proximal phalanx of fifth toe. No other acute fracture or dislocation. No aggressive osseous lesion. Ankle mortise appears intact. No focal soft tissue swelling. No radiopaque foreign bodies. IMPRESSION: *Mildly displaced fracture of the proximal portion of the proximal phalanx of fifth toe. Electronically Signed   By: Jules Schick M.D.   On: 08/16/2023 13:12   DG Ankle Complete Left Result Date: 08/16/2023 CLINICAL DATA:  Fall.  Unable to bear weight. EXAM: LEFT ANKLE COMPLETE - 3+ VIEW; LEFT FOOT - COMPLETE 3+ VIEW COMPARISON:  None Available. FINDINGS: There is diffuse osteopenia of the visualized osseous structures. There is mildly displaced fracture of the proximal portion of the proximal phalanx of fifth toe. No other acute fracture or dislocation. No aggressive osseous lesion. Ankle mortise appears intact. No focal soft tissue swelling. No radiopaque foreign bodies. IMPRESSION: *Mildly displaced fracture of the proximal portion of the proximal phalanx of fifth toe. Electronically Signed   By: Jules Schick M.D.   On: 08/16/2023 13:12    All pertinent xrays, MRI, CT independently reviewed and interpreted  Positive ROS: All other systems have been reviewed and were otherwise negative with the exception of those mentioned in the HPI and as  above.  Physical Exam: General: No acute distress Cardiovascular: No pedal edema Respiratory: No cyanosis, no use of accessory musculature GI: No organomegaly, abdomen is soft and non-tender Skin: No lesions in the area of chief complaint Neurologic: Sensation intact distally Psychiatric: Patient is at baseline mood and affect Lymphatic: No axillary or cervical lymphadenopathy  MUSCULOSKELETAL:  - severe pain with movement of the hip and extremity - skin intact - NVI distally - compartments soft  Assessment: Right femoral neck fracture  Plan: - surgical treatment is recommended for pain relief, quality of life and early mobilization - HCPOA is aware of r/b/a and wish to proceed, informed consent obtained - medical optimization per primary team - surgery is planned for today - nonop treatment for left 5th toe  Thank you for the consult and the opportunity to see Ms. Marlowe  N. Glee Arvin, MD [employer] 9:49 AM

## 2023-08-18 NOTE — Anesthesia Preprocedure Evaluation (Addendum)
Anesthesia Evaluation  Patient identified by MRN, date of birth, ID bandGeneral Assessment Comment:sleeping  Reviewed: Allergy & Precautions, H&P , NPO status , Patient's Chart, lab work & pertinent test results  History of Anesthesia Complications Negative for: history of anesthetic complications  Airway Mallampati: II  TM Distance: >3 FB Neck ROM: Full    Dental  (+) Edentulous Upper, Edentulous Lower   Pulmonary neg pulmonary ROS   breath sounds clear to auscultation       Cardiovascular Exercise Tolerance: Good negative cardio ROS  Rhythm:Regular     Neuro/Psych  PSYCHIATRIC DISORDERS  Depression   Dementia Dementia with behavioral disturbance   Neuromuscular disease  negative psych ROS   GI/Hepatic negative GI ROS, Neg liver ROS,GERD  Medicated,,(+) Hepatitis -, C  Endo/Other  negative endocrine ROS    Renal/GU negative Renal ROS  negative genitourinary   Musculoskeletal  closed fracture of left hip   Abdominal   Peds  Hematology negative hematology ROS (+)   Anesthesia Other Findings PLTS 108  Reproductive/Obstetrics negative OB ROS                             Anesthesia Physical Anesthesia Plan  ASA: 4 and emergent  Anesthesia Plan: Spinal and MAC   Post-op Pain Management: Minimal or no pain anticipated   Induction: Intravenous  PONV Risk Score and Plan: 3 and Propofol infusion  Airway Management Planned: Oral ETT  Additional Equipment: None  Intra-op Plan:   Post-operative Plan: Extubation in OR  Informed Consent: I have reviewed the patients History and Physical, chart, labs and discussed the procedure including the risks, benefits and alternatives for the proposed anesthesia with the patient or authorized representative who has indicated his/her understanding and acceptance.    Continue DNR.   History available from chart only  Plan Discussed with: CRNA and  Surgeon  Anesthesia Plan Comments: ([name] is a current hospice patient, with hospice diagnosis of dementia in other diseases classified elsewhere, unspecified severity, with agitation. She was admitted 08/16/23 with diagnosis of. Closed subcapital fracture of neck of femur, right.   PER CHART If pulseless and not breathing  No CPR or chest compressions. In Pre-Arrest Conditions (Patient Is Breathing and Has A Pulse) Do not intubate. Provide all appropriate non-invasive medical interventions.  I have had a long discussion with [name] and feel that the family would like [place] to avoid intubation and chest compressions at all costs.  )        Anesthesia Quick Evaluation

## 2023-08-18 NOTE — Progress Notes (Signed)
Pt received from PACU during the 1600 hour. Pt alert and oriented x1. Pt initially calm and cooperative upon arrival to unit but got up OOB, pulled purewick out, removed hand mitts. Pt placed back into bed. Mitts and purewick replaced. Bed alarm on. Within 15 mins of RN leaving pts bedside pt removed purewick, mitts, and surgical dressing. MD notified of the pts behaviors and placed safety sitter order. No sitter available so RN stayed at bedside from 6:15-7:00pm until nightshift staff arrival. During Rns time @ bedside pt received PRN PO pain medication and was feed dinner.

## 2023-08-18 NOTE — Anesthesia Procedure Notes (Signed)
Spinal  Patient location during procedure: OR Start time: 08/18/2023 12:27 PM End time: 08/18/2023 12:30 PM Reason for block: surgical anesthesia Staffing Performed: anesthesiologist  Anesthesiologist: Bethena Midget, MD Performed by: Bethena Midget, MD Authorized by: Bethena Midget, MD   Preanesthetic Checklist Completed: patient identified, IV checked, site marked, risks and benefits discussed, surgical consent, monitors and equipment checked, pre-op evaluation and timeout performed Spinal Block Patient position: sitting Prep: DuraPrep Patient monitoring: heart rate, cardiac monitor, continuous pulse ox and blood pressure Approach: midline Location: L4-5 Injection technique: single-shot Needle Needle type: Quincke  Needle gauge: 22 G Needle length: 9 cm Assessment Sensory level: T4 Events: CSF return

## 2023-08-18 NOTE — Discharge Instructions (Signed)
    1. Change dressings as needed 2. May shower but keep incisions covered and dry 3. Take your prescribed blood thinner to prevent blood clots 4. Take stool softeners as needed 5. Take pain meds as needed  I have reviewed the patient's history and given the presence of a fragility fracture, I have deemed the necessity of a osteoporosis management referral or confirmed that the patient is currently enrolled in a osteoporosis treatment program.

## 2023-08-18 NOTE — Transfer of Care (Signed)
Immediate Anesthesia Transfer of Care Note  Patient: Elizabeth Tucker  Procedure(s) Performed: PARTIAL HIP ARTHROPLASTY ANTERIOR APPROACH (Right: Hip)  Patient Location: PACU  Anesthesia Type:Spinal  Level of Consciousness: awake and alert   Airway & Oxygen Therapy: Patient Spontanous Breathing and Patient connected to face mask oxygen  Post-op Assessment: Report given to RN and Post -op Vital signs reviewed and stable  Post vital signs: Reviewed and stable  Last Vitals:  Vitals Value Taken Time  BP 93/64 08/18/23 1445  Temp    Pulse 80 08/18/23 1441  Resp 11 08/18/23 1446  SpO2 98 % 08/18/23 1441  Vitals shown include unfiled device data.  Last Pain:  Vitals:   08/18/23 0500  TempSrc: Oral  PainSc:          Complications: No notable events documented.

## 2023-08-18 NOTE — OR Nursing (Signed)
Attempting to speak over the phone with POA Scheryl Darter for consent but there was no answer. To make Dr. Roda Shutters aware.

## 2023-08-18 NOTE — Progress Notes (Signed)
PROGRESS NOTE    Elizabeth Tucker  KGM:[id number] DOB: 26-Dec-1945 DOA: 08/16/2023 PCP: Caesar Bookman, NP  Outpatient Specialists:     Brief Narrative:  Patient is a 77 year old female, skilled nursing facility resident, with past medical history significant for dementia, nonverbal at baseline, GERD, and hep C.  Patient was admitted with right displaced femoral neck fracture following a fall.  Patient has undergone partial hip arthroplasty.  Input from orthopedic surgery turning appreciated.   Assessment & Plan:   Principal Problem:   Closed subcapital fracture of neck of femur, right, initial encounter [facility]) Active Problems:   Dementia with behavioral disturbance (HCC)   Fall at home, initial encounter   Fall   Closed fracture of fifth toe of left foot   Fall at home w/ R femur fracture  - NPO for surgery on Fri 08/18/2023 w/ Dr. Roda Shutters from orthopedic surgery - Tylenol PRN  - Celebrex 200 mg PO bid  - IV D5-LR 20K+ @ 75 cc/hr  - IV morphine 1 mg q1 hr PRN  08/18/2023: Pain is controlled.  Pursue disposition.   Dementia with behavioral disturbance (HCC) - Haldol 2 mg PO bid      DVT prophylaxis: Subcutaneous Lovenox. Code Status: DO NOT RESUSCITATE Family Communication:  Disposition Plan:    Consultants:  Orthopedic surgery.  Procedures:  Partial right hip arthroplasty.  Antimicrobials:  IV cefazolin X 3 doses.   Subjective: No complaints. Poor historian.  Objective: Vitals:   08/18/23 1535 08/18/23 1545 08/18/23 1550 08/18/23 1615  BP: (!) 88/65 90/64 90/64  96/62  Pulse:  75 71 72  Resp: 10 11 12 18   Temp:    97.6 F (36.4 C)  TempSrc:    Oral  SpO2: 100% 100% 99% 100%    Intake/Output Summary (Last 24 hours) at 08/18/2023 1700 Last data filed at 08/18/2023 1445 Gross per 24 hour  Intake 400 ml  Output 1300 ml  Net -900 ml   There were no vitals filed for this visit.  Examination:  General exam: Appears calm and comfortable  Respiratory  system: Clear to auscultation.  Cardiovascular system: S1 & S2  Gastrointestinal system: Abdomen is soft and nontender.   Central nervous system: Awake.  Te.     Data Reviewed: I have personally reviewed following labs and imaging studies  CBC: Recent Labs  Lab 08/16/23 1426  WBC 4.1  NEUTROABS 3.2  HGB 13.1  HCT 37.6  MCV 89.5  PLT 108*   Basic Metabolic Panel: Recent Labs  Lab 08/16/23 1426  NA 139  K 3.9  CL 106  CO2 24  GLUCOSE 106*  BUN 18  CREATININE 0.55  CALCIUM 8.9   GFR: CrCl cannot be calculated (Unknown ideal weight.). Liver Function Tests: No results for input(s): "AST", "ALT", "ALKPHOS", "BILITOT", "PROT", "ALBUMIN" in the last 168 hours. No results for input(s): "LIPASE", "AMYLASE" in the last 168 hours. No results for input(s): "AMMONIA" in the last 168 hours. Coagulation Profile: Recent Labs  Lab 08/16/23 1832  INR 1.2   Cardiac Enzymes: No results for input(s): "CKTOTAL", "CKMB", "CKMBINDEX", "TROPONINI" in the last 168 hours. BNP (last 3 results) No results for input(s): "PROBNP" in the last 8760 hours. HbA1C: No results for input(s): "HGBA1C" in the last 72 hours. CBG: No results for input(s): "GLUCAP" in the last 168 hours. Lipid Profile: No results for input(s): "CHOL", "HDL", "LDLCALC", "TRIG", "CHOLHDL", "LDLDIRECT" in the last 72 hours. Thyroid Function Tests: No results for input(s): "TSH", "T4TOTAL", "FREET4", "T3FREE", "  THYROIDAB" in the last 72 hours. Anemia Panel: No results for input(s): "VITAMINB12", "FOLATE", "FERRITIN", "TIBC", "IRON", "RETICCTPCT" in the last 72 hours. Urine analysis:    Component Value Date/Time   COLORURINE YELLOW 01/19/2021 1001   APPEARANCEUR CLEAR 01/19/2021 1001   LABSPEC 1.013 01/19/2021 1001   PHURINE 6.0 01/19/2021 1001   GLUCOSEU NEGATIVE 01/19/2021 1001   HGBUR NEGATIVE 01/19/2021 1001   HGBUR negative 02/01/2008 1038   BILIRUBINUR NEGATIVE 01/19/2021 1001   KETONESUR NEGATIVE  01/19/2021 1001   PROTEINUR NEGATIVE 01/19/2021 1001   UROBILINOGEN 1.0 08/14/2008 0851   NITRITE NEGATIVE 01/19/2021 1001   LEUKOCYTESUR TRACE (A) 01/19/2021 1001   Sepsis Labs: @LABRCNTIP (procalcitonin:4,lacticidven:4)  ) Recent Results (from the past 240 hours)  Surgical pcr screen     Status: None   Collection Time: 08/17/23  1:29 AM   Specimen: Nasal Mucosa; Nasal Swab  Result Value Ref Range Status   MRSA, PCR NEGATIVE NEGATIVE Final   Staphylococcus aureus NEGATIVE NEGATIVE Final    Comment: (NOTE) The Xpert SA Assay (FDA approved for NASAL specimens in patients 17 years of age and older), is one component of a comprehensive surveillance program. It is not intended to diagnose infection nor to guide or monitor treatment. Performed at [facility], [address] [place], [place] [address]          Radiology Studies: DG HIP UNILAT WITH PELVIS 1V RIGHT Result Date: 08/18/2023 CLINICAL DATA:  Elective surgery. EXAM: DG HIP (WITH OR WITHOUT PELVIS) 1V RIGHT COMPARISON:  Preoperative imaging. FINDINGS: Five fluoroscopic spot views of the pelvis and right hip obtained in the operating room. Sequential images during hip arthroplasty. Fluoroscopy time 7.4 seconds. Dose 0.5109 mGy. IMPRESSION: Intraoperative fluoroscopy during right hip arthroplasty. Electronically Signed   By: Narda Rutherford M.D.   On: 08/18/2023 14:04   DG C-Arm 1-60 Min-No Report Result Date: 08/18/2023 Fluoroscopy was utilized by the requesting physician.  No radiographic interpretation.        Scheduled Meds:  acetaminophen  500 mg Oral Q6H   celecoxib  200 mg Oral BID   docusate sodium  100 mg Oral BID   [START ON 08/19/2023] enoxaparin (LOVENOX) injection  40 mg Subcutaneous Q24H   haloperidol  2 mg Oral BID   polyvinyl alcohol  1 drop Both Eyes BID   senna-docusate  2 tablet Oral BID   sodium chloride flush  3 mL Intravenous Q12H   traZODone  50 mg Oral QHS   zinc oxide  1  Application Topical TID   Continuous Infusions:   ceFAZolin (ANCEF) IV     tranexamic acid       LOS: 2 days    Time spent: 35 minutes.    Berton Mount, MD  [facility] Pager #: [phone] 7PM-7AM contact night coverage as above

## 2023-08-18 NOTE — Progress Notes (Addendum)
CCC Pre-op Review  Pre-op checklist: To be completed by bedside RN  NPO: Yes  Labs: PCR-Neg. T&S done  Consent: Attempted to call POA for telephone consent per nightshift RN  H&P: Hospitalist  Vitals: WNL  O2 requirements: RA  MAR/PTA review: No GLPS, No anticoags and no BB  IV: 20g x2 L AC and R FA  Floor nurse name:  Edd Fabian, RN   Additional info:  Pt is nonverbal with dementa. POA is [place] [facility] [phone].  As of 10am , still awaiting call back from [facility] for telephone consent

## 2023-08-18 NOTE — Progress Notes (Signed)
Called [facility] [facility]) and left a message via voicemail to get a consent for today's surgery.Still waiting for call back as of this writing.

## 2023-08-19 DIAGNOSIS — S72011A Unspecified intracapsular fracture of right femur, initial encounter for closed fracture: Secondary | ICD-10-CM | POA: Diagnosis not present

## 2023-08-19 LAB — CBC
HCT: 32.1 % — ABNORMAL LOW (ref 36.0–46.0)
Hemoglobin: 11.1 g/dL — ABNORMAL LOW (ref 12.0–15.0)
MCH: 30.3 pg (ref 26.0–34.0)
MCHC: 34.6 g/dL (ref 30.0–36.0)
MCV: 87.7 fL (ref 80.0–100.0)
Platelets: 101 10*3/uL — ABNORMAL LOW (ref 150–400)
RBC: 3.66 MIL/uL — ABNORMAL LOW (ref 3.87–5.11)
RDW: 14 % (ref 11.5–15.5)
WBC: 3.8 10*3/uL — ABNORMAL LOW (ref 4.0–10.5)
nRBC: 0 % (ref 0.0–0.2)

## 2023-08-19 LAB — BASIC METABOLIC PANEL
Anion gap: 6 (ref 5–15)
BUN: 13 mg/dL (ref 8–23)
CO2: 23 mmol/L (ref 22–32)
Calcium: 8.6 mg/dL — ABNORMAL LOW (ref 8.9–10.3)
Chloride: 104 mmol/L (ref 98–111)
Creatinine, Ser: 0.77 mg/dL (ref 0.44–1.00)
GFR, Estimated: >60 mL/min (ref >60)
Glucose, Bld: 122 mg/dL — ABNORMAL HIGH (ref 70–99)
Potassium: 4.1 mmol/L (ref 3.5–5.1)
Sodium: 133 mmol/L — ABNORMAL LOW (ref 135–145)

## 2023-08-19 NOTE — Evaluation (Signed)
Physical Therapy Evaluation Patient Details Name: Elizabeth Tucker MRN: [id number] DOB: Apr 17, 1946 Today's Date: 08/19/2023  History of Present Illness  77 y.o. female presents to [facility] 08/16/23 from SNF after falling w/ R displaced femoral neck fx and mildly displaced fx of proximal phalanx of L 5th toe. S/p THA anterior approach 12/27. Non-op management of L 5th toe. PMHx: L THA 2022, dementia w/ behavioral disturbance   Clinical Impression  Pt in bed upon arrival with sitter present and agreeable to PT eval. Pt arrived from a SNF after falling with unclear prior mobility level. In today's session, pt was able to perform bed mobility with ModAx2-MaxAx2. Pt was able to stand w/ [facility] and ModAx2 with multiple cues for sequencing and technique. Pt was able to take a few steps with increased time and frequent cueing w/ RW management. Pt presents to therapy session with decreased LE strength/ROM, balance, and mobility. Pt would benefit from acute skilled PT to address functional impairments. Recommending post-acute rehab <3hrs to improve mobility. Acute PT to follow.          If plan is discharge home, recommend the following: A lot of help with walking and/or transfers;A lot of help with bathing/dressing/bathroom;Assistance with cooking/housework;Direct supervision/assist for medications management;Direct supervision/assist for financial management;Assist for transportation;Help with stairs or ramp for entrance;Supervision due to cognitive status   Can travel by private vehicle   No    Equipment Recommendations Other (comment) (TBD at next venue)     Functional Status Assessment Patient has had a recent decline in their functional status and demonstrates the ability to make significant improvements in function in a reasonable and predictable amount of time.     Precautions / Restrictions Precautions Precautions: Fall Precaution Comments: no hip prec, sitter Restrictions Weight Bearing  Restrictions Per Provider Order: Yes RLE Weight Bearing Per Provider Order: Weight bearing as tolerated      Mobility  Bed Mobility Overal bed mobility: Needs Assistance Bed Mobility: Supine to Sit, Sit to Supine     Supine to sit: Mod assist, +2 for physical assistance Sit to supine: Max assist, +2 for physical assistance   General bed mobility comments: ModAx2 for sup/sit for LE management w/ helicopter method. Slight assist for trunk elevation. MaxAx2 for return to supine with pt assisting w/ trunk descent    Transfers Overall transfer level: Needs assistance Equipment used: 2 person hand held assist Transfers: Sit to/from Stand Sit to Stand: Mod assist, +2 safety/equipment    General transfer comment: pt attempting to stand w/ no cueing, ModAx2 for safety w/ 2 HH assist. Pt attempted to take steps forwards w/ RW.    Ambulation/Gait Ambulation/Gait assistance: Mod assist, +2 safety/equipment Gait Distance (Feet): 4 Feet Assistive device: Rolling walker (2 wheels) Gait Pattern/deviations: Step-to pattern, Decreased weight shift to right, Trunk flexed, Narrow base of support, Shuffle Gait velocity: dec     General Gait Details: decreased WB on R LE w/ slight left lateral trunk lean, able to take a few steps fowards/backwards with ModAx2 for safety w/ multiple cues for sequencing and RW management.     Balance Overall balance assessment: Needs assistance, Mild deficits observed, not formally tested, History of Falls Sitting-balance support: No upper extremity supported, Feet supported Sitting balance-Leahy Scale: Fair   Postural control: Left lateral lean Standing balance support: Bilateral upper extremity supported, During functional activity, Reliant on assistive device for balance Standing balance-Leahy Scale: Poor Standing balance comment: left lateral lean upon standing, needs RW and physical assist for  balance           Pertinent Vitals/Pain Pain  Assessment Pain Assessment: Faces Faces Pain Scale: Hurts a little bit Pain Location: R LE w/ movement Pain Descriptors / Indicators: Aching, Discomfort Pain Intervention(s): Limited activity within patient's tolerance, Repositioned, Monitored during session    Home Living Family/patient expects to be discharged to:: Skilled nursing facility    Prior Function Prior Level of Function : Patient poor historian/Family not available;History of Falls (last six months)    Mobility Comments: unclear prior mobility status ADLs Comments: unclear prior assist for ADLs     Extremity/Trunk Assessment   Upper Extremity Assessment Upper Extremity Assessment: Defer to OT evaluation    Lower Extremity Assessment Lower Extremity Assessment: RLE deficits/detail;LLE deficits/detail RLE Deficits / Details: unable to flex R hip or extend R knee. Able to wiggle toes and slightly ankle PF/DF LLE Deficits / Details: unable to flex hip, 3+/5 knee ext, at least 3/5 ankle DF    Cervical / Trunk Assessment Cervical / Trunk Assessment: Kyphotic  Communication   Communication Communication: Difficulty communicating thoughts/reduced clarity of speech Cueing Techniques: Verbal cues;Tactile cues  Cognition Arousal: Alert Behavior During Therapy: Flat affect Overall Cognitive Status: History of cognitive impairments - at baseline    General Comments: A&Ox1, able to answer simple questions w/ one word answers. Follows single step command w/ increased time.        General Comments General comments (skin integrity, edema, etc.): VSS on RA, bleeding from IV site with RN present in room at end of session. Sitter present throughout     PT Assessment Patient needs continued PT services  PT Problem List Decreased strength;Decreased range of motion;Decreased activity tolerance;Decreased balance;Decreased mobility;Decreased knowledge of use of DME;Decreased safety awareness       PT Treatment Interventions  DME instruction;Gait training;Functional mobility training;Therapeutic activities;Therapeutic exercise;Balance training;Neuromuscular re-education;Patient/family education;Wheelchair mobility training    PT Goals (Current goals can be found in the Care Plan section)  Acute Rehab PT Goals Patient Stated Goal: pt not able to participate PT Goal Formulation: Patient unable to participate in goal setting Time For Goal Achievement: 09/02/23 Potential to Achieve Goals: Good    Frequency Min 1X/week     Co-evaluation   Reason for Co-Treatment: For patient/therapist safety;Complexity of the patient's impairments (multi-system involvement);To address functional/ADL transfers PT goals addressed during session: Mobility/safety with mobility;Balance;Proper use of DME         AM-PAC PT "6 Clicks" Mobility  Outcome Measure Help needed turning from your back to your side while in a flat bed without using bedrails?: A Lot Help needed moving from lying on your back to sitting on the side of a flat bed without using bedrails?: Total Help needed moving to and from a bed to a chair (including a wheelchair)?: Total Help needed standing up from a chair using your arms (e.g., wheelchair or bedside chair)?: Total Help needed to walk in hospital room?: Total Help needed climbing 3-5 steps with a railing? : Total 6 Click Score: 7    End of Session Equipment Utilized During Treatment: Gait belt Activity Tolerance: Patient tolerated treatment well Patient left: in bed;with call bell/phone within reach;with nursing/sitter in room;with bed alarm set;with family/visitor present Nurse Communication: Mobility status (bleeding from IV site) PT Visit Diagnosis: Unsteadiness on feet (R26.81);History of falling (Z91.81);Muscle weakness (generalized) (M62.81)    Time: 7829-5621 PT Time Calculation (min) (ACUTE ONLY): 21 min   Charges:   PT Evaluation $PT Eval Low Complexity: 1 Low  PT General Charges $$  ACUTE PT VISIT: 1 Visit         Hilton Cork, PT, DPT Secure Chat Preferred  Rehab Office [phone]   Arturo Morton Brion Aliment 08/19/2023, 3:14 PM

## 2023-08-19 NOTE — Progress Notes (Signed)
PROGRESS NOTE    Elizabeth Tucker  JOA:[id number] DOB: Feb 07, 1946 DOA: 08/16/2023 PCP: Caesar Bookman, NP  Outpatient Specialists:     Brief Narrative:  Patient is a 77 year old female, skilled nursing facility resident, with past medical history significant for dementia, nonverbal at baseline, GERD, and hep C.  Patient was admitted with right displaced femoral neck fracture following a fall.  Patient has undergone partial hip arthroplasty.  Input from orthopedic surgery turning appreciated.  08/19/2023: Patient seen.  No new changes.  Awaiting SNF placement.   Assessment & Plan:   Principal Problem:   Closed subcapital fracture of neck of femur, right, initial encounter [facility]) Active Problems:   Dementia with behavioral disturbance (HCC)   Fall at home, initial encounter   Fall   Closed fracture of fifth toe of left foot   Fall at home w/ R femur fracture  - NPO for surgery on Fri 08/18/2023 w/ Dr. Roda Shutters from orthopedic surgery - Tylenol PRN  - Celebrex 200 mg PO bid  - IV D5-LR 20K+ @ 75 cc/hr  - IV morphine 1 mg q1 hr PRN  08/19/2023: Pain is controlled.  Pursue disposition.   Dementia with behavioral disturbance (HCC) - Haldol 2 mg PO bid      DVT prophylaxis: Subcutaneous Lovenox. Code Status: DO NOT RESUSCITATE Family Communication:  Disposition Plan:    Consultants:  Orthopedic surgery.  Procedures:  Partial right hip arthroplasty.  Antimicrobials:  IV cefazolin X 3 doses (completed).   Subjective: No complaints. Poor historian.  Objective: Vitals:   08/18/23 1550 08/18/23 1615 08/18/23 2042 08/19/23 0831  BP: 90/64 96/62 101/63 112/63  Pulse: 71 72 81 83  Resp: 12 18 18 15   Temp:  97.6 F (36.4 C) 98.3 F (36.8 C) 99 F (37.2 C)  TempSrc:  Oral Oral Oral  SpO2: 99% 100% 94% 100%    Intake/Output Summary (Last 24 hours) at 08/19/2023 1626 Last data filed at 08/19/2023 1043 Gross per 24 hour  Intake 633.61 ml  Output 200 ml  Net 433.61  ml   There were no vitals filed for this visit.  Examination:  General exam: Appears calm and comfortable  Respiratory system: Clear to auscultation.  Cardiovascular system: S1 & S2  Gastrointestinal system: Abdomen is soft and nontender.   Central nervous system: Awake.      Data Reviewed: I have personally reviewed following labs and imaging studies  CBC: Recent Labs  Lab 08/16/23 1426 08/18/23 1648 08/19/23 0534  WBC 4.1 4.4 3.8*  NEUTROABS 3.2  --   --   HGB 13.1 11.7* 11.1*  HCT 37.6 34.2* 32.1*  MCV 89.5 90.0 87.7  PLT 108* 94* 101*   Basic Metabolic Panel: Recent Labs  Lab 08/16/23 1426 08/18/23 1648 08/19/23 0534  NA 139  --  133*  K 3.9  --  4.1  CL 106  --  104  CO2 24  --  23  GLUCOSE 106*  --  122*  BUN 18  --  13  CREATININE 0.55 0.64 0.77  CALCIUM 8.9  --  8.6*   GFR: CrCl cannot be calculated (Unknown ideal weight.). Liver Function Tests: No results for input(s): "AST", "ALT", "ALKPHOS", "BILITOT", "PROT", "ALBUMIN" in the last 168 hours. No results for input(s): "LIPASE", "AMYLASE" in the last 168 hours. No results for input(s): "AMMONIA" in the last 168 hours. Coagulation Profile: Recent Labs  Lab 08/16/23 1832  INR 1.2   Cardiac Enzymes: No results for input(s): "CKTOTAL", "CKMB", "  CKMBINDEX", "TROPONINI" in the last 168 hours. BNP (last 3 results) No results for input(s): "PROBNP" in the last 8760 hours. HbA1C: No results for input(s): "HGBA1C" in the last 72 hours. CBG: No results for input(s): "GLUCAP" in the last 168 hours. Lipid Profile: No results for input(s): "CHOL", "HDL", "LDLCALC", "TRIG", "CHOLHDL", "LDLDIRECT" in the last 72 hours. Thyroid Function Tests: No results for input(s): "TSH", "T4TOTAL", "FREET4", "T3FREE", "THYROIDAB" in the last 72 hours. Anemia Panel: No results for input(s): "VITAMINB12", "FOLATE", "FERRITIN", "TIBC", "IRON", "RETICCTPCT" in the last 72 hours. Urine analysis:    Component Value  Date/Time   COLORURINE YELLOW 01/19/2021 1001   APPEARANCEUR CLEAR 01/19/2021 1001   LABSPEC 1.013 01/19/2021 1001   PHURINE 6.0 01/19/2021 1001   GLUCOSEU NEGATIVE 01/19/2021 1001   HGBUR NEGATIVE 01/19/2021 1001   HGBUR negative 02/01/2008 1038   BILIRUBINUR NEGATIVE 01/19/2021 1001   KETONESUR NEGATIVE 01/19/2021 1001   PROTEINUR NEGATIVE 01/19/2021 1001   UROBILINOGEN 1.0 08/14/2008 0851   NITRITE NEGATIVE 01/19/2021 1001   LEUKOCYTESUR TRACE (A) 01/19/2021 1001   Sepsis Labs: @LABRCNTIP (procalcitonin:4,lacticidven:4)  ) Recent Results (from the past 240 hours)  Surgical pcr screen     Status: None   Collection Time: 08/17/23  1:29 AM   Specimen: Nasal Mucosa; Nasal Swab  Result Value Ref Range Status   MRSA, PCR NEGATIVE NEGATIVE Final   Staphylococcus aureus NEGATIVE NEGATIVE Final    Comment: (NOTE) The Xpert SA Assay (FDA approved for NASAL specimens in patients 69 years of age and older), is one component of a comprehensive surveillance program. It is not intended to diagnose infection nor to guide or monitor treatment. Performed at [facility], [address] [place], [place] [address]          Radiology Studies: DG Pelvis Portable Result Date: 08/18/2023 CLINICAL DATA:  [id number] History of open reduction and internal fixation (ORIF) procedure [id number] EXAM: PORTABLE PELVIS 1-2 VIEWS COMPARISON:  08/16/2023 FINDINGS: Interval postsurgical changes from right hip hemiarthroplasty. Arthroplasty components are in their expected alignment. No periprosthetic fracture or evidence of other complication. Expected postoperative changes within the overlying soft tissues. IMPRESSION: Interval postsurgical changes from right hip hemiarthroplasty without evidence of complication. Electronically Signed   By: Duanne Guess D.O.   On: 08/18/2023 18:43   DG HIP UNILAT WITH PELVIS 1V RIGHT Result Date: 08/18/2023 CLINICAL DATA:  Elective surgery. EXAM: DG HIP (WITH OR  WITHOUT PELVIS) 1V RIGHT COMPARISON:  Preoperative imaging. FINDINGS: Five fluoroscopic spot views of the pelvis and right hip obtained in the operating room. Sequential images during hip arthroplasty. Fluoroscopy time 7.4 seconds. Dose 0.5109 mGy. IMPRESSION: Intraoperative fluoroscopy during right hip arthroplasty. Electronically Signed   By: Narda Rutherford M.D.   On: 08/18/2023 14:04   DG C-Arm 1-60 Min-No Report Result Date: 08/18/2023 Fluoroscopy was utilized by the requesting physician.  No radiographic interpretation.        Scheduled Meds:  celecoxib  200 mg Oral BID   docusate sodium  100 mg Oral BID   enoxaparin (LOVENOX) injection  40 mg Subcutaneous Q24H   haloperidol  2 mg Oral BID   polyvinyl alcohol  1 drop Both Eyes BID   senna-docusate  2 tablet Oral BID   sodium chloride flush  3 mL Intravenous Q12H   traZODone  50 mg Oral QHS   zinc oxide  1 Application Topical TID   Continuous Infusions:     LOS: 3 days    Time spent: 35 minutes.  Berton Mount, MD  [facility] Pager #: [phone] 7PM-7AM contact night coverage as above

## 2023-08-19 NOTE — Anesthesia Postprocedure Evaluation (Signed)
Anesthesia Post Note  Patient: Elizabeth Tucker  Procedure(s) Performed: PARTIAL HIP ARTHROPLASTY ANTERIOR APPROACH (Right: Hip)     Patient location during evaluation: PACU Anesthesia Type: MAC, Regional and Spinal Level of consciousness: awake and alert Pain management: pain level controlled Vital Signs Assessment: post-procedure vital signs reviewed and stable Respiratory status: spontaneous breathing, nonlabored ventilation, respiratory function stable and patient connected to nasal cannula oxygen Cardiovascular status: stable and blood pressure returned to baseline Postop Assessment: no apparent nausea or vomiting Anesthetic complications: no   No notable events documented.  Last Vitals:  Vitals:   08/18/23 1615 08/18/23 2042  BP: 96/62 101/63  Pulse: 72 81  Resp: 18 18  Temp: 36.4 C 36.8 C  SpO2: 100% 94%    Last Pain:  Vitals:   08/18/23 2042  TempSrc: Oral  PainSc:                  [name]

## 2023-08-19 NOTE — Progress Notes (Signed)
[facility] Hospitalized [facility]ospice Patient?Visit   Elizabeth Tucker is a current hospice patient, with hospice diagnosis of dementia in other diseases classified elsewhere, unspecified severity, with agitation. She was admitted 12.25.24 with diagnosis of closed subcapital fracture of neck of femur, right. [facility] was notified upon her arrival to ED. Per Dr. Patric Dykes, hospice physician, this is a related hospital admission.   Visited patient at bedside. No family present. Patient recently back in room from surgery. She is nonverbal but does not appear to be in any pain at this time. Sitter in place due to patient's behavior last night.(Pulling out purewick and dressings) Per MD, plan is SNF when medically ready, however this has not been confirmed with PCG. Voicemail left for Elizabeth Tucker.   Patient remains inpatient appropriate due to need for IV fluids, recent surgical fixation of fracture, and close monitoring given recent surgical fixation of fracture and monitoring for pain control.   ?  V/S:? 99, 83, 15, 112/63, 100% on RA  ?I/O:??1, 033.6 intake/ 2,061ml urine/100 ml blood   ?  Abnormal?Labs:?  Sodium 135 - 145 mmol/L 133 (L)  Glucose 70 - 99 mg/dL 696 (H)  Calcium 8.9 - 10.3 mg/dL 8.6 (L)  WBC 4.0 - 29.5 K/uL 3.8 (L)  RBC 3.87 - 5.11 MIL/uL 3.66 (L)  Hemoglobin 12.0 - 15.0 g/dL 28.4 (L)  HCT 13.2 - 44.0 % 32.1 (L)  Platelets 150 - 400 K/uL 101 (L)    Diagnostics:? No new ones since 12.27  ?  IV/PRN:? IV sodium chloride 3 ML every 12 hours   ?  Problem List:   Principal Problem:   Closed subcapital fracture of neck of femur, right, initial encounter [facility]) Active Problems:   Dementia with behavioral disturbance (HCC)   Fall at home, initial encounter   Fall   Closed fracture of fifth toe of left foot     Fall at home w/ R femur fracture  - NPO for surgery on Fri 08/18/2023 w/ Dr. Roda Shutters from orthopedic surgery - Tylenol PRN  - Celebrex 200 mg PO bid  - IV D5-LR  20K+ @ 75 cc/hr  - IV morphine 1 mg q1 hr PRN  08/18/2023: Pain is controlled.  Pursue disposition.   Dementia with behavioral disturbance (HCC) - Haldol 2 mg PO bid   Discharge Planning:  Medical team continues to work on plan  ?Family Contact:? Left VM for Elizabeth Tucker  ?IDT:?Updated   ?Goals of Care:  DNR  Please call with any questions or concerns. Thank you  Should this patient need transportation at discharge, please use [facility].  Dionicio Stall, LCSW [facility] hospital liaison [phone]

## 2023-08-19 NOTE — Progress Notes (Signed)
Patient ID: Elizabeth Tucker, female   DOB: [birth year], 77 y.o.   MRN: [id number]   Subjective: 1 Day Post-Op Procedure(s) (LRB): PARTIAL HIP ARTHROPLASTY ANTERIOR APPROACH (Right) Patient reports pain as 0 on 0-10 scale.   Pt non verbal and eyes closed. Sitter at bedside. Objective: Vital signs in last 24 hours: Temp:  [97.5 F (36.4 C)-99 F (37.2 C)] 99 F (37.2 C) (12/28 0831) Pulse Rate:  [67-83] 83 (12/28 0831) Resp:  [10-19] 15 (12/28 0831) BP: (85-112)/(58-81) 112/63 (12/28 0831) SpO2:  [94 %-100 %] 100 % (12/28 0831)  Intake/Output from previous day: 12/27 0701 - 12/28 0700 In: 403 [I.V.:403] Out: 1450 [Urine:1350; Blood:100] Intake/Output this shift: Total I/O In: 530.6 [IV Piggyback:530.6] Out: -   Recent Labs    08/16/23 1426 08/18/23 1648 08/19/23 0534  HGB 13.1 11.7* 11.1*   Recent Labs    08/18/23 1648 08/19/23 0534  WBC 4.4 3.8*  RBC 3.80* 3.66*  HCT 34.2* 32.1*  PLT 94* 101*   Recent Labs    08/16/23 1426 08/18/23 1648 08/19/23 0534  NA 139  --  133*  K 3.9  --  4.1  CL 106  --  104  CO2 24  --  23  BUN 18  --  13  CREATININE 0.55 0.64 0.77  GLUCOSE 106*  --  122*  CALCIUM 8.9  --  8.6*   Recent Labs    08/16/23 1832  INR 1.2    Dressing dry DG Pelvis Portable Result Date: 08/18/2023 CLINICAL DATA:  [id number] History of open reduction and internal fixation (ORIF) procedure [id number] EXAM: PORTABLE PELVIS 1-2 VIEWS COMPARISON:  08/16/2023 FINDINGS: Interval postsurgical changes from right hip hemiarthroplasty. Arthroplasty components are in their expected alignment. No periprosthetic fracture or evidence of other complication. Expected postoperative changes within the overlying soft tissues. IMPRESSION: Interval postsurgical changes from right hip hemiarthroplasty without evidence of complication. Electronically Signed   By: Duanne Guess D.O.   On: 08/18/2023 18:43   DG HIP UNILAT WITH PELVIS 1V RIGHT Result Date: 08/18/2023 CLINICAL  DATA:  Elective surgery. EXAM: DG HIP (WITH OR WITHOUT PELVIS) 1V RIGHT COMPARISON:  Preoperative imaging. FINDINGS: Five fluoroscopic spot views of the pelvis and right hip obtained in the operating room. Sequential images during hip arthroplasty. Fluoroscopy time 7.4 seconds. Dose 0.5109 mGy. IMPRESSION: Intraoperative fluoroscopy during right hip arthroplasty. Electronically Signed   By: Narda Rutherford M.D.   On: 08/18/2023 14:04   DG C-Arm 1-60 Min-No Report Result Date: 08/18/2023 Fluoroscopy was utilized by the requesting physician.  No radiographic interpretation.    Assessment/Plan: 1 Day Post-Op Procedure(s) (LRB): PARTIAL HIP ARTHROPLASTY ANTERIOR APPROACH (Right) Plan:  placement back to SNF when medically stable.   Eldred Manges 08/19/2023, 10:19 AM

## 2023-08-19 NOTE — Plan of Care (Signed)
  Problem: Education: Goal: Knowledge of General Education information will improve Description: Including pain rating scale, medication(s)/side effects and non-pharmacologic comfort measures Outcome: Adequate for Discharge-  patient has advanced dementia with minimal speech, unable to participate in education or care at baseline   Problem: Health Behavior/Discharge Planning: Goal: Ability to manage health-related needs will improve Outcome: Adequate for Discharge- patient has advanced dementia with minimal speech, unable to participate in education or care at baseline   Problem: Education: Goal: Verbalization of understanding the information provided (i.e., activity precautions, restrictions, etc) will improve Outcome: Adequate for Discharge-  patient has advanced dementia with minimal speech, unable to participate in education or care at baseline Goal: Individualized Educational Video(s) Outcome: Adequate for Discharge-  patient has advanced dementia with minimal speech, unable to participate in education or care at baseline   Problem: Clinical Measurements: Goal: Ability to maintain clinical measurements within normal limits will improve Outcome: Progressing Goal: Will remain free from infection Outcome: Progressing Goal: Diagnostic test results will improve Outcome: Progressing Goal: Respiratory complications will improve Outcome: Progressing Goal: Cardiovascular complication will be avoided Outcome: Progressing   Problem: Activity: Goal: Risk for activity intolerance will decrease Outcome: Progressing   Problem: Nutrition: Goal: Adequate nutrition will be maintained Outcome: Progressing   Problem: Coping: Goal: Level of anxiety will decrease Outcome: Progressing   Problem: Elimination: Goal: Will not experience complications related to bowel motility Outcome: Progressing Goal: Will not experience complications related to urinary retention Outcome: Progressing    Problem: Pain Management: Goal: General experience of comfort will improve Outcome: Progressing   Problem: Safety: Goal: Ability to remain free from injury will improve Outcome: Progressing   Problem: Skin Integrity: Goal: Risk for impaired skin integrity will decrease Outcome: Progressing   Problem: Activity: Goal: Ability to ambulate and perform ADLs will improve Outcome: Progressing   Problem: Clinical Measurements: Goal: Postoperative complications will be avoided or minimized Outcome: Progressing   Problem: Self-Concept: Goal: Ability to maintain and perform role responsibilities to the fullest extent possible will improve Outcome: Progressing   Problem: Pain Management: Goal: Pain level will decrease Outcome: Progressing

## 2023-08-19 NOTE — Evaluation (Signed)
Occupational Therapy Evaluation Patient Details Name: Elizabeth Tucker MRN: [id number] DOB: 09/05/1945 Today's Date: 08/19/2023   History of Present Illness 77 y.o. female presents to [facility] 08/16/23 from SNF after falling w/ R displaced femoral neck fx and mildly displaced fx of proximal phalanx of L 5th toe. S/p THA anterior approach 12/27. Non-op management of L 5th toe. PMHx: L THA 2022, dementia w/ behavioral disturbance   Clinical Impression   Pt required max cueing for participation and sequencing, was able to follow simple commands inconsistently. Pt from SNF, plans to return to SNF. Pt c/o no pain, some grimacing with bed mobility and taking steps. Pt mod A for bed mobility and transfers. Pt has difficulty following commands, A/Ox1. Pt would benefit from continued acute OT to maximize participation and improve to PLOF due to recent injury, return to SNF appropriate.       If plan is discharge home, recommend the following: Two people to help with walking and/or transfers;A lot of help with bathing/dressing/bathroom;Assistance with cooking/housework;Help with stairs or ramp for entrance;Supervision due to cognitive status;Direct supervision/assist for medications management    Functional Status Assessment  Patient has had a recent decline in their functional status and demonstrates the ability to make significant improvements in function in a reasonable and predictable amount of time.  Equipment Recommendations  None recommended by OT    Recommendations for Other Services       Precautions / Restrictions Precautions Precautions: Fall Precaution Comments: no hip prec, sitter Restrictions Weight Bearing Restrictions Per Provider Order: Yes RLE Weight Bearing Per Provider Order: Weight bearing as tolerated      Mobility Bed Mobility Overal bed mobility: Needs Assistance Bed Mobility: Supine to Sit, Sit to Supine     Supine to sit: Mod assist, +2 for physical assistance Sit  to supine: Max assist, +2 for physical assistance   General bed mobility comments: ModAx2 for sup/sit for LE management w/ helicopter method. Slight assist for trunk elevation. MaxAx2 for return to supine with pt assisting w/ trunk descent    Transfers Overall transfer level: Needs assistance Equipment used: 2 person hand held assist Transfers: Sit to/from Stand Sit to Stand: Mod assist, +2 safety/equipment           General transfer comment: pt attempting to stand w/ no cueing, ModAx2 for safety w/ 2 HH assist. Pt attempted to take steps forwards w/ RW.      Balance Overall balance assessment: Needs assistance, Mild deficits observed, not formally tested, History of Falls Sitting-balance support: No upper extremity supported, Feet supported Sitting balance-Leahy Scale: Fair   Postural control: Left lateral lean Standing balance support: Bilateral upper extremity supported, During functional activity, Reliant on assistive device for balance Standing balance-Leahy Scale: Poor Standing balance comment: left lateral lean upon standing, needs RW and physical assist for balance                           ADL either performed or assessed with clinical judgement   ADL Overall ADL's : Needs assistance/impaired Eating/Feeding: Moderate assistance;Cueing for sequencing;Sitting   Grooming: Moderate assistance;Cueing for sequencing;Sitting   Upper Body Bathing: Moderate assistance;Cueing for sequencing;Sitting   Lower Body Bathing: Total assistance;Sitting/lateral leans   Upper Body Dressing : Moderate assistance;Cueing for sequencing;Sitting   Lower Body Dressing: Total assistance;Sitting/lateral leans   Toilet Transfer: Moderate assistance;Cueing for sequencing;BSC/3in1;Rolling walker (2 wheels)   Toileting- Clothing Manipulation and Hygiene: Total assistance;Sit to/from stand  Functional mobility during ADLs: Moderate assistance;Cueing for sequencing;Rolling  walker (2 wheels) General ADL Comments: Pt requires significant assistance for all ADLs, max cueing for sequencing and cueing.     Vision         Perception         Praxis         Pertinent Vitals/Pain Pain Assessment Faces Pain Scale: Hurts a little bit Pain Location: R LE w/ movement Pain Descriptors / Indicators: Aching, Discomfort     Extremity/Trunk Assessment Upper Extremity Assessment Upper Extremity Assessment: Generalized weakness;Difficult to assess due to impaired cognition   Lower Extremity Assessment Lower Extremity Assessment: Defer to PT evaluation RLE Deficits / Details: unable to flex R hip or extend R knee. Able to wiggle toes and slightly ankle PF/DF LLE Deficits / Details: unable to flex hip, 3+/5 knee ext, at least 3/5 ankle DF   Cervical / Trunk Assessment Cervical / Trunk Assessment: Kyphotic   Communication Communication Communication: Difficulty communicating thoughts/reduced clarity of speech;Difficulty following commands/understanding Following commands: Follows one step commands inconsistently;Follows one step commands with increased time Cueing Techniques: Verbal cues;Tactile cues   Cognition Arousal: Alert Behavior During Therapy: Flat affect Overall Cognitive Status: History of cognitive impairments - at baseline                                 General Comments: A&Ox1, able to answer simple questions w/ one word answers. Follows single step command inconsistently w/ increased time.     General Comments  VSS on RA, bleeding from IV site with RN present in room at end of session. Sitter present throughout    Exercises     Shoulder Instructions      Home Living Family/patient expects to be discharged to:: Skilled nursing facility                                        Prior Functioning/Environment Prior Level of Function : Patient poor historian/Family not available;History of Falls (last six  months)             Mobility Comments: unclear prior mobility status ADLs Comments: unclear prior assist for ADLs        OT Problem List: Decreased strength;Decreased range of motion;Decreased activity tolerance;Impaired balance (sitting and/or standing);Decreased safety awareness;Decreased cognition;Impaired UE functional use;Pain      OT Treatment/Interventions: Self-care/ADL training;Therapeutic exercise;Energy conservation;DME and/or AE instruction;Therapeutic activities;Patient/family education    OT Goals(Current goals can be found in the care plan section) Acute Rehab OT Goals Patient Stated Goal: not able to participate in goal setting OT Goal Formulation: Patient unable to participate in goal setting Time For Goal Achievement: 09/02/23 Potential to Achieve Goals: Fair  OT Frequency: Min 1X/week    Co-evaluation   Reason for Co-Treatment: For patient/therapist safety;Complexity of the patient's impairments (multi-system involvement);To address functional/ADL transfers PT goals addressed during session: Mobility/safety with mobility;Balance;Proper use of DME        AM-PAC OT "6 Clicks" Daily Activity     Outcome Measure Help from another person eating meals?: A Lot Help from another person taking care of personal grooming?: A Lot Help from another person toileting, which includes using toliet, bedpan, or urinal?: Total Help from another person bathing (including washing, rinsing, drying)?: A Lot Help from another person to put on and taking off regular  upper body clothing?: A Lot Help from another person to put on and taking off regular lower body clothing?: Total 6 Click Score: 10   End of Session Equipment Utilized During Treatment: Gait belt;Rolling walker (2 wheels) Nurse Communication: Mobility status;Other (comment) (IV bleed, RN informed)  Activity Tolerance: Patient tolerated treatment well Patient left: in bed;with call bell/phone within reach;with bed  alarm set;with nursing/sitter in room  OT Visit Diagnosis: Unsteadiness on feet (R26.81);Other abnormalities of gait and mobility (R26.89);Repeated falls (R29.6);Muscle weakness (generalized) (M62.81);Pain;Other symptoms and signs involving cognitive function Pain - Right/Left: Right Pain - part of body: Hip                Time: 1434-1455 OT Time Calculation (min): 21 min Charges:  OT General Charges $OT Visit: 1 Visit OT Evaluation $OT Eval Moderate Complexity: 1 [address] OTR/L   Alexis Goodell 08/19/2023, 3:30 PM

## 2023-08-20 DIAGNOSIS — S72011A Unspecified intracapsular fracture of right femur, initial encounter for closed fracture: Secondary | ICD-10-CM | POA: Diagnosis not present

## 2023-08-20 LAB — CBC
HCT: 32.7 % — ABNORMAL LOW (ref 36.0–46.0)
Hemoglobin: 11.4 g/dL — ABNORMAL LOW (ref 12.0–15.0)
MCH: 30.6 pg (ref 26.0–34.0)
MCHC: 34.9 g/dL (ref 30.0–36.0)
MCV: 87.9 fL (ref 80.0–100.0)
Platelets: 101 10*3/uL — ABNORMAL LOW (ref 150–400)
RBC: 3.72 MIL/uL — ABNORMAL LOW (ref 3.87–5.11)
RDW: 14 % (ref 11.5–15.5)
WBC: 3.5 10*3/uL — ABNORMAL LOW (ref 4.0–10.5)
nRBC: 0 % (ref 0.0–0.2)

## 2023-08-20 LAB — BASIC METABOLIC PANEL
Anion gap: 7 (ref 5–15)
BUN: 14 mg/dL (ref 8–23)
CO2: 22 mmol/L (ref 22–32)
Calcium: 8.5 mg/dL — ABNORMAL LOW (ref 8.9–10.3)
Chloride: 106 mmol/L (ref 98–111)
Creatinine, Ser: 0.63 mg/dL (ref 0.44–1.00)
GFR, Estimated: >60 mL/min (ref >60)
Glucose, Bld: 115 mg/dL — ABNORMAL HIGH (ref 70–99)
Potassium: 4 mmol/L (ref 3.5–5.1)
Sodium: 135 mmol/L (ref 135–145)

## 2023-08-20 NOTE — Progress Notes (Addendum)
[facility] Hospitalized Hospice Patient?Note   Elizabeth Tucker is a current hospice patient, followed at [facility] in their memory care unit with hospice terminal diagnosis of abnormal weight loss.   Patient suffered a fall at her facility 12.24.24.  Facility sent her to the ED for evaluation as she was unable to bear weight on left lower extremity.  She was admitted to [facility] on 12.25.24 with diagnosis of closed subcapital fracture of neck of right femur.  [facility] was notified upon her arrival to ED. Per Dr. Patric Dykes, hospice physician, this is a related hospital admission.   Patient is day 2 post op.  She is post OT & PT evaluation.  Patient has sitter at bedside due to patient behavioral disturbances.  Patient has hand mittens on.  No family at bedside.  SW working on discharge plan.  I spoke with patient's daughter, Felipe Drone regarding hospitalization and discharge plan.  She has talked with DC planner at [facility] and would like for patient to go to rehab if she would qualify.  She understands that hospice and rehab cannot exist together and she would choose to revoke her hospice benefit to pursue short term rehab if she is accepted.  Patient has not had anything for pain since yesterday.  Patient appears comfortable with no non-verbal signs of pain or discomfort.    Patient is appropriate for GIP requiring frequent skilled assessment and monitoring in a post operative patient with behavioral disturbances in the setting of advanced dementia.  Vital Signs: T 99.1 oral, BP 125/89, P89, R 16  Abnormal Labs: Glucose 115, Calcium 8.5,  WBC 3.5, RBC 3.72, HGB 11.4, HCT 32.7, Platelets 101,   I&O: Intake 633.58ml Output Net 433.61ml Diagnostics:  No new  IV Meds: Lovenox 40mg  [place] every day Haldol 2mg  PO BID  Hospital Problems: per Berton Mount MD progress note 12.28.24 @ 1626  Fall at home w/ R femur fracture  - NPO for surgery on Fri 08/18/2023  w/ Dr. Roda Shutters from orthopedic surgery - Tylenol PRN  - Celebrex 200 mg PO bid  12.29-  Pain is controlled.  Pursue disposition.   Dementia with behavioral disturbance (HCC) - Haldol 2 mg PO bid   Sitter at bedside    DVT prophylaxis: Subcutaneous Lovenox. Code Status: DO NOT RESUSCITATE  Consultants:  Orthopedic surgery- cleared from Ortho perspective   Procedures:  Partial right hip arthroplasty.   Antimicrobials:  IV cefazolin X 3 doses (completed).   Discharge Plan- Likely to STR. Goals of care:  ongoing IDT:  updated Family contact:  Spoke with Scheryl Darter, daughter via phone call.  See above.  If patient requires ambulance transport at [facility], please use [facility].   Please call with any hospice related questions or concerns.   Norris Cross, RN Nurse Liaison [phone]

## 2023-08-20 NOTE — Progress Notes (Signed)
Patient ID: Elizabeth Tucker, female   DOB: Jan 22, 1946, 77 y.o.   MRN: [id number] Dressing intact without drainage. In mittens watching TV. Hgb 11.4 . Stable from Ortho standpoint. Awaiting SNF transfer.

## 2023-08-20 NOTE — Progress Notes (Signed)
PROGRESS NOTE    Elizabeth Tucker  WGN:[id number] DOB: 1945-10-16 DOA: 08/16/2023 PCP: Caesar Bookman, NP  Outpatient Specialists:     Brief Narrative:  Patient is a 77 year old female, skilled nursing facility resident, with past medical history significant for dementia, nonverbal at baseline, GERD, and hep C.  Patient was admitted with right displaced femoral neck fracture following a fall.  Patient has undergone partial hip arthroplasty.  Input from orthopedic surgery turning appreciated.  08/20/2023: Patient seen.  No new changes.  Awaiting SNF placement.   Assessment & Plan:   Principal Problem:   Closed subcapital fracture of neck of femur, right, initial encounter [facility]) Active Problems:   Dementia with behavioral disturbance (HCC)   Fall at home, initial encounter   Fall   Closed fracture of fifth toe of left foot   Fall at home w/ R femur fracture  - NPO for surgery on Fri 08/18/2023 w/ Dr. Roda Shutters from orthopedic surgery - Tylenol PRN  - Celebrex 200 mg PO bid  - IV D5-LR 20K+ @ 75 cc/hr  - IV morphine 1 mg q1 hr PRN  08/19/2023: Pain is controlled.  Pursue disposition.   Dementia with behavioral disturbance (HCC) - Haldol 2 mg PO bid      DVT prophylaxis: Subcutaneous Lovenox. Code Status: DO NOT RESUSCITATE Family Communication:  Disposition Plan:    Consultants:  Orthopedic surgery.  Procedures:  Partial right hip arthroplasty.  Antimicrobials:  IV cefazolin X 3 doses (completed).   Subjective: No complaints. Poor historian.  Objective: Vitals:   08/18/23 2042 08/19/23 0831 08/19/23 2022 08/20/23 0904  BP: 101/63 112/63 115/72 125/89  Pulse: 81 83 83 89  Resp: 18 15 18 16   Temp: 98.3 F (36.8 C) 99 F (37.2 C) 97.9 F (36.6 C) 99.1 F (37.3 C)  TempSrc: Oral Oral Oral Oral  SpO2: 94% 100% 99%    No intake or output data in the 24 hours ending 08/20/23 1230  There were no vitals filed for this visit.  Examination:  General exam:  Appears calm and comfortable  Respiratory system: Clear to auscultation.  Cardiovascular system: S1 & S2  Gastrointestinal system: Abdomen is soft and nontender.   Central nervous system: Awake.      Data Reviewed: I have personally reviewed following labs and imaging studies  CBC: Recent Labs  Lab 08/16/23 1426 08/18/23 1648 08/19/23 0534 08/20/23 0447  WBC 4.1 4.4 3.8* 3.5*  NEUTROABS 3.2  --   --   --   HGB 13.1 11.7* 11.1* 11.4*  HCT 37.6 34.2* 32.1* 32.7*  MCV 89.5 90.0 87.7 87.9  PLT 108* 94* 101* 101*   Basic Metabolic Panel: Recent Labs  Lab 08/16/23 1426 08/18/23 1648 08/19/23 0534 08/20/23 0447  NA 139  --  133* 135  K 3.9  --  4.1 4.0  CL 106  --  104 106  CO2 24  --  23 22  GLUCOSE 106*  --  122* 115*  BUN 18  --  13 14  CREATININE 0.55 0.64 0.77 0.63  CALCIUM 8.9  --  8.6* 8.5*   GFR: CrCl cannot be calculated (Unknown ideal weight.). Liver Function Tests: No results for input(s): "AST", "ALT", "ALKPHOS", "BILITOT", "PROT", "ALBUMIN" in the last 168 hours. No results for input(s): "LIPASE", "AMYLASE" in the last 168 hours. No results for input(s): "AMMONIA" in the last 168 hours. Coagulation Profile: Recent Labs  Lab 08/16/23 1832  INR 1.2   Cardiac Enzymes: No results for  input(s): "CKTOTAL", "CKMB", "CKMBINDEX", "TROPONINI" in the last 168 hours. BNP (last 3 results) No results for input(s): "PROBNP" in the last 8760 hours. HbA1C: No results for input(s): "HGBA1C" in the last 72 hours. CBG: No results for input(s): "GLUCAP" in the last 168 hours. Lipid Profile: No results for input(s): "CHOL", "HDL", "LDLCALC", "TRIG", "CHOLHDL", "LDLDIRECT" in the last 72 hours. Thyroid Function Tests: No results for input(s): "TSH", "T4TOTAL", "FREET4", "T3FREE", "THYROIDAB" in the last 72 hours. Anemia Panel: No results for input(s): "VITAMINB12", "FOLATE", "FERRITIN", "TIBC", "IRON", "RETICCTPCT" in the last 72 hours. Urine analysis:    Component  Value Date/Time   COLORURINE YELLOW 01/19/2021 1001   APPEARANCEUR CLEAR 01/19/2021 1001   LABSPEC 1.013 01/19/2021 1001   PHURINE 6.0 01/19/2021 1001   GLUCOSEU NEGATIVE 01/19/2021 1001   HGBUR NEGATIVE 01/19/2021 1001   HGBUR negative 02/01/2008 1038   BILIRUBINUR NEGATIVE 01/19/2021 1001   KETONESUR NEGATIVE 01/19/2021 1001   PROTEINUR NEGATIVE 01/19/2021 1001   UROBILINOGEN 1.0 08/14/2008 0851   NITRITE NEGATIVE 01/19/2021 1001   LEUKOCYTESUR TRACE (A) 01/19/2021 1001   Sepsis Labs: @LABRCNTIP (procalcitonin:4,lacticidven:4)  ) Recent Results (from the past 240 hours)  Surgical pcr screen     Status: None   Collection Time: 08/17/23  1:29 AM   Specimen: Nasal Mucosa; Nasal Swab  Result Value Ref Range Status   MRSA, PCR NEGATIVE NEGATIVE Final   Staphylococcus aureus NEGATIVE NEGATIVE Final    Comment: (NOTE) The Xpert SA Assay (FDA approved for NASAL specimens in patients 67 years of age and older), is one component of a comprehensive surveillance program. It is not intended to diagnose infection nor to guide or monitor treatment. Performed at [facility], [address] [place], [place] [address]          Radiology Studies: DG Pelvis Portable Result Date: 08/18/2023 CLINICAL DATA:  [id number] History of open reduction and internal fixation (ORIF) procedure [id number] EXAM: PORTABLE PELVIS 1-2 VIEWS COMPARISON:  08/16/2023 FINDINGS: Interval postsurgical changes from right hip hemiarthroplasty. Arthroplasty components are in their expected alignment. No periprosthetic fracture or evidence of other complication. Expected postoperative changes within the overlying soft tissues. IMPRESSION: Interval postsurgical changes from right hip hemiarthroplasty without evidence of complication. Electronically Signed   By: Duanne Guess D.O.   On: 08/18/2023 18:43   DG HIP UNILAT WITH PELVIS 1V RIGHT Result Date: 08/18/2023 CLINICAL DATA:  Elective surgery. EXAM: DG HIP  (WITH OR WITHOUT PELVIS) 1V RIGHT COMPARISON:  Preoperative imaging. FINDINGS: Five fluoroscopic spot views of the pelvis and right hip obtained in the operating room. Sequential images during hip arthroplasty. Fluoroscopy time 7.4 seconds. Dose 0.5109 mGy. IMPRESSION: Intraoperative fluoroscopy during right hip arthroplasty. Electronically Signed   By: Narda Rutherford M.D.   On: 08/18/2023 14:04   DG C-Arm 1-60 Min-No Report Result Date: 08/18/2023 Fluoroscopy was utilized by the requesting physician.  No radiographic interpretation.        Scheduled Meds:  docusate sodium  100 mg Oral BID   enoxaparin (LOVENOX) injection  40 mg Subcutaneous Q24H   haloperidol  2 mg Oral BID   polyvinyl alcohol  1 drop Both Eyes BID   senna-docusate  2 tablet Oral BID   sodium chloride flush  3 mL Intravenous Q12H   traZODone  50 mg Oral QHS   zinc oxide  1 Application Topical TID   Continuous Infusions:     LOS: 4 days    Time spent: 35 minutes.    Berton Mount,  MD  [facility] Pager #: [phone] 7PM-7AM contact night coverage as above

## 2023-08-20 NOTE — NC FL2 (Cosign Needed)
Index MEDICAID FL2 LEVEL OF CARE FORM     IDENTIFICATION  Patient Name: Elizabeth Tucker Birthdate: February 24, 1946 Sex: female Admission Date (Current Location): 08/16/2023  [facility] and [place] Number:  Producer, television/film/video and Address:  [facility], [address], [place], [place] [address]      Provider Number: [id number]  Attending Physician Name and Address:  Barnetta Chapel, MD  Relative Name and Phone Number:  Scheryl Darter [phone]    Current Level of Care: Hospital Recommended Level of Care: Skilled Nursing Facility Prior Approval Number:    Date Approved/Denied:   PASRR Number: [id number]  Discharge Plan: SNF    Current Diagnoses: Patient Active Problem List   Diagnosis Date Noted   Closed fracture of fifth toe of left foot 08/18/2023   Fall at home, initial encounter 08/16/2023   Fall 08/16/2023   Closed subcapital fracture of neck of femur, right, initial encounter (HCC) 08/16/2023   Closed left hip fracture, initial encounter (HCC) 01/19/2021   Dementia with behavioral disturbance (HCC) 01/19/2021   Class 2 obesity due to excess calories with body mass index (BMI) of 36.0 to 36.9 in adult 01/19/2021   Calculus of gallbladder 12/05/2009   DEPRESSION 11/10/2009   WEIGHT GAIN, ABNORMAL 11/10/2009   Chronic hepatitis C virus infection (HCC) 02/14/2008   ANA POSITIVE 02/14/2008   ANKLE EDEMA 02/01/2008   PETECHIAE 02/01/2008   HYPOGLYCEMIA 05/11/2007   MOURNING 05/11/2007   INSOMNIA 05/11/2007   Internal hemorrhoids 11/01/1999    Orientation RESPIRATION BLADDER Height & Weight     Self  Normal Indwelling catheter Weight:   Height:     BEHAVIORAL SYMPTOMS/MOOD NEUROLOGICAL BOWEL NUTRITION STATUS      Continent Diet (see discharge summary)  AMBULATORY STATUS COMMUNICATION OF NEEDS Skin   Limited Assist Non-Verbally Surgical wounds (right hip incision)                       Personal Care  Assistance Level of Assistance  Bathing, Dressing Bathing Assistance: Limited assistance   Dressing Assistance: Limited assistance     Functional Limitations Info  Sight, Hearing Sight Info: Impaired (glasses) Hearing Info: Impaired      SPECIAL CARE FACTORS FREQUENCY  PT (By licensed PT), OT (By licensed OT)     PT Frequency: 5x/wk OT Frequency: 5x/wk            Contractures      Additional Factors Info  Code Status, Allergies Code Status Info: DNR Allergies Info: Sulfonamide Derivatives           Current Medications (08/20/2023):  This is the current hospital active medication list Current Facility-Administered Medications  Medication Dose Route Frequency Provider Last Rate Last Admin   acetaminophen (TYLENOL) tablet 325-650 mg  325-650 mg Oral Q6H PRN Tarry Kos, MD       alum & mag hydroxide-simeth (MAALOX/MYLANTA) 200-200-20 MG/5ML suspension 30 mL  30 mL Oral Q4H PRN Tarry Kos, MD       docusate sodium (COLACE) capsule 100 mg  100 mg Oral BID Tarry Kos, MD   100 mg at 08/20/23 0909   enoxaparin (LOVENOX) injection 40 mg  40 mg Subcutaneous Q24H Tarry Kos, MD   40 mg at 08/20/23 3875   haloperidol (HALDOL) tablet 2 mg  2 mg Oral BID Tarry Kos, MD   2 mg at 08/20/23 0909   HYDROcodone-acetaminophen (NORCO) 7.5-325 MG per tablet 1-2 tablet  1-2 tablet Oral Q4H PRN Tarry Kos, MD   1 tablet at 08/19/23 2440   HYDROcodone-acetaminophen (NORCO/VICODIN) 5-325 MG per tablet 1-2 tablet  1-2 tablet Oral Q4H PRN Tarry Kos, MD       magnesium citrate solution 1 Bottle  1 Bottle Oral Once PRN Tarry Kos, MD       menthol-cetylpyridinium (CEPACOL) lozenge 3 mg  1 lozenge Oral PRN Tarry Kos, MD       Or   phenol (CHLORASEPTIC) mouth spray 1 spray  1 spray Mouth/Throat PRN Tarry Kos, MD       methocarbamol (ROBAXIN) tablet 500 mg  500 mg Oral Q6H PRN Tarry Kos, MD   500 mg at 08/19/23 1027   Or   methocarbamol (ROBAXIN) injection 500 mg   500 mg Intravenous Q6H PRN Tarry Kos, MD       morphine (PF) 2 MG/ML injection 1 mg  1 mg Intravenous Q1H PRN Tarry Kos, MD       ondansetron [facility]) tablet 4 mg  4 mg Oral Q6H PRN Tarry Kos, MD       Or   ondansetron [facility]) injection 4 mg  4 mg Intravenous Q6H PRN Tarry Kos, MD       polyethylene glycol (MIRALAX / GLYCOLAX) packet 17 g  17 g Oral Daily PRN Tarry Kos, MD       polyvinyl alcohol (LIQUIFILM TEARS) 1.4 % ophthalmic solution 1 drop  1 drop Both Eyes BID Tarry Kos, MD   1 drop at 08/20/23 0910   senna-docusate (Senokot-S) tablet 2 tablet  2 tablet Oral BID Tarry Kos, MD   2 tablet at 08/20/23 2536   sodium chloride flush (NS) 0.9 % injection 3 mL  3 mL Intravenous Q12H Tarry Kos, MD   3 mL at 08/20/23 0917   sorbitol 70 % solution 30 mL  30 mL Oral Daily PRN Tarry Kos, MD       traZODone (DESYREL) tablet 50 mg  50 mg Oral QHS Tarry Kos, MD   50 mg at 08/19/23 2138   zinc oxide 20 % ointment 1 Application  1 Application Topical TID Tarry Kos, MD   1 Application at 08/20/23 6440     Discharge Medications: Please see discharge summary for a list of discharge medications.  Relevant Imaging Results:  Relevant Lab Results:   Additional Information SSN:  [id number]; Has [facility] MCR and [place] MCD  Izell Champion M Karmel Patricelli, LCSWA

## 2023-08-20 NOTE — TOC Progression Note (Addendum)
Transition of Care [facility]) - Progression Note    Patient Details  Name: Elizabeth Tucker MRN: [id number] Date of Birth: May 14, 1946  Transition of Care [facility]) CM/SW Contact  Donnalee Curry, LCSWA Phone Number: 08/20/2023, 9:34 AM  Clinical Narrative:     Per chart review, Pt from [facility] with active [facility] services. Pt has [facility] MCR and [place] MCD listed, but unsure if accurate.   SW attempted to contact [facility] [phone]) to confirm PLF but line hangs up each time.   SW attempted to contact POA Latoya [phone]) no answer, VM left.   Update 1051am SW received callback from [facility], SW explained SNF process and the possibility of not finding a SNF to accept due to need for sitter, progression of dementia and pt participation. SW also discussed the need to revisit conversations regarding hospice and if SNF aligns with care as it will need to be rescinded. Latoya agreeable to starting SNF process with the understanding pt may likely return to ALF if they can meet her needs and pt is near baseline functioning and unable to participate in PT/OT services at rehab. Pt will need to be sitter/mitten free for at least 24 hours prior to SNF placement.  [employer] MCR: [id number] [place] MCD: [id number] L       Expected Discharge Plan and Services                                               Social Determinants of Health (SDOH) Interventions SDOH Screenings   Tobacco Use: Low Risk  (08/18/2023)    Readmission Risk Interventions     No data to display

## 2023-08-21 ENCOUNTER — Encounter (HOSPITAL_COMMUNITY): Payer: Self-pay | Admitting: Orthopaedic Surgery

## 2023-08-21 DIAGNOSIS — S72011A Unspecified intracapsular fracture of right femur, initial encounter for closed fracture: Secondary | ICD-10-CM | POA: Diagnosis not present

## 2023-08-21 NOTE — Progress Notes (Signed)
[facility] Hospitalized Hospice Patient?Note   Elizabeth Tucker is a current hospice patient, followed at [facility] in their memory care unit with hospice terminal diagnosis of abnormal weight loss.   Patient suffered a fall at her facility 12.24.24.  Facility sent her to the ED for evaluation as she was unable to bear weight on left lower extremity.  She was admitted to [facility] on 12.25.24 with diagnosis of closed subcapital fracture of neck of right femur.  [facility] was notified upon her arrival to ED. Per Dr. Patric Tucker, hospice physician, this is a related hospital admission.    During visit today, patient is asleep in the chair.  Patient has hand mittens on and uneaten breakfast tray in front of her. No sitter present during visit. No family at bedside.  SW working on discharge plan.  Daughter has talked with DC planner at [facility] and would like for patient to go to rehab if she would qualify. She is currently reviewing bed offers and Medicare ratings. Patient appears comfortable with no non-verbal signs of pain or discomfort.     Patient is appropriate for GIP requiring frequent skilled assessment and monitoring in a post operative patient with behavioral disturbances in the setting of advanced dementia.   Vital Signs: 98.1/ 79/ 16   104/72   98% on RA   Abnormal Labs: No new   I&O: Intake Output  Diagnostics:  No new   IV/PRN Meds: Lovenox 40mg  [place] every day Haldol 2mg  PO BID   Hospital Problems: per Elizabeth Mount MD progress note 12.29.24   Principal Problem:   Closed subcapital fracture of neck of femur, right, initial encounter [facility]) Active Problems:   Dementia with behavioral disturbance (HCC)   Fall at home, initial encounter   Fall   Closed fracture of fifth toe of left foot    Fall at home w/ R femur fracture  - NPO for surgery on Fri 08/18/2023 w/ Dr. Roda Tucker from orthopedic surgery - Tylenol PRN  - Celebrex 200 mg PO bid  -  IV D5-LR 20K+ @ 75 cc/hr  - IV morphine 1 mg q1 hr PRN  08/19/2023: Pain is controlled.  Pursue disposition.   Dementia with behavioral disturbance (HCC) - Haldol 2 mg PO bid    Discharge Plan- Likely to STR. Goals of care:  ongoing IDT:  updated Family contact:  See above.   If patient requires ambulance transport at discharge, please use [facility].   Please call with any hospice related questions or concerns.    [facility] Nurse Liaison [phone]

## 2023-08-21 NOTE — Progress Notes (Signed)
PROGRESS NOTE    Elizabeth Tucker  ZHY:[id number] DOB: [birth year] DOA: 08/16/2023 PCP: No primary care provider on file.  Outpatient Specialists:     Brief Narrative:  Patient is a 77 year old female, skilled nursing facility resident, with past medical history significant for dementia, nonverbal at baseline, GERD, and hep C.  Patient was admitted with right displaced femoral neck fracture following a fall.  Patient has undergone partial hip arthroplasty.  Input from orthopedic surgery turning appreciated.  08/21/2023: Patient seen.  No new changes.  Awaiting SNF placement.  Called Scheryl Darter on [phone] and left a voicemail.   Assessment & Plan:   Principal Problem:   Closed subcapital fracture of neck of femur, right, initial encounter [facility]) Active Problems:   Dementia with behavioral disturbance (HCC)   Fall at home, initial encounter   Fall   Closed fracture of fifth toe of left foot   Fall at home w/ R femur fracture  - NPO for surgery on Fri 08/18/2023 w/ Dr. Roda Shutters from orthopedic surgery - Tylenol PRN  - Celebrex 200 mg PO bid  - IV D5-LR 20K+ @ 75 cc/hr  - IV morphine 1 mg q1 hr PRN  08/21/2023: Pain is controlled.  Pursue disposition.   Dementia with behavioral disturbance (HCC) - Haldol 2 mg PO bid      DVT prophylaxis: Subcutaneous Lovenox. Code Status: DO NOT RESUSCITATE Family Communication:  Disposition Plan:    Consultants:  Orthopedic surgery.  Procedures:  Partial right hip arthroplasty.  Antimicrobials:  IV cefazolin X 3 doses (completed).   Subjective: No complaints. Poor historian.  Objective: Vitals:   08/20/23 2116 08/21/23 0420 08/21/23 1029 08/21/23 1643  BP: 103/79 105/71 104/72 113/75  Pulse:  81 79 85  Resp: 17 16 16 16   Temp: 99.8 F (37.7 C) 98 F (36.7 C) 98.1 F (36.7 C) 98.3 F (36.8 C)  TempSrc: Oral Oral Oral Oral  SpO2: 98% 98% 98% 99%    Intake/Output Summary (Last 24 hours) at 08/21/2023 1752 Last data  filed at 08/21/2023 0422 Gross per 24 hour  Intake --  Output 550 ml  Net -550 ml    There were no vitals filed for this visit.  Examination:  General exam: Appears calm and comfortable  Respiratory system: Clear to auscultation.  Cardiovascular system: S1 & S2  Gastrointestinal system: Abdomen is soft and nontender.   Central nervous system: Awake.      Data Reviewed: I have personally reviewed following labs and imaging studies  CBC: Recent Labs  Lab 08/16/23 1426 08/18/23 1648 08/19/23 0534 08/20/23 0447  WBC 4.1 4.4 3.8* 3.5*  NEUTROABS 3.2  --   --   --   HGB 13.1 11.7* 11.1* 11.4*  HCT 37.6 34.2* 32.1* 32.7*  MCV 89.5 90.0 87.7 87.9  PLT 108* 94* 101* 101*   Basic Metabolic Panel: Recent Labs  Lab 08/16/23 1426 08/18/23 1648 08/19/23 0534 08/20/23 0447  NA 139  --  133* 135  K 3.9  --  4.1 4.0  CL 106  --  104 106  CO2 24  --  23 22  GLUCOSE 106*  --  122* 115*  BUN 18  --  13 14  CREATININE 0.55 0.64 0.77 0.63  CALCIUM 8.9  --  8.6* 8.5*   GFR: CrCl cannot be calculated (Unknown ideal weight.). Liver Function Tests: No results for input(s): "AST", "ALT", "ALKPHOS", "BILITOT", "PROT", "ALBUMIN" in the last 168 hours. No results for input(s): "LIPASE", "AMYLASE" in  the last 168 hours. No results for input(s): "AMMONIA" in the last 168 hours. Coagulation Profile: Recent Labs  Lab 08/16/23 1832  INR 1.2   Cardiac Enzymes: No results for input(s): "CKTOTAL", "CKMB", "CKMBINDEX", "TROPONINI" in the last 168 hours. BNP (last 3 results) No results for input(s): "PROBNP" in the last 8760 hours. HbA1C: No results for input(s): "HGBA1C" in the last 72 hours. CBG: No results for input(s): "GLUCAP" in the last 168 hours. Lipid Profile: No results for input(s): "CHOL", "HDL", "LDLCALC", "TRIG", "CHOLHDL", "LDLDIRECT" in the last 72 hours. Thyroid Function Tests: No results for input(s): "TSH", "T4TOTAL", "FREET4", "T3FREE", "THYROIDAB" in the last 72  hours. Anemia Panel: No results for input(s): "VITAMINB12", "FOLATE", "FERRITIN", "TIBC", "IRON", "RETICCTPCT" in the last 72 hours. Urine analysis:    Component Value Date/Time   COLORURINE YELLOW 01/19/2021 1001   APPEARANCEUR CLEAR 01/19/2021 1001   LABSPEC 1.013 01/19/2021 1001   PHURINE 6.0 01/19/2021 1001   GLUCOSEU NEGATIVE 01/19/2021 1001   HGBUR NEGATIVE 01/19/2021 1001   HGBUR negative 02/01/2008 1038   BILIRUBINUR NEGATIVE 01/19/2021 1001   KETONESUR NEGATIVE 01/19/2021 1001   PROTEINUR NEGATIVE 01/19/2021 1001   UROBILINOGEN 1.0 08/14/2008 0851   NITRITE NEGATIVE 01/19/2021 1001   LEUKOCYTESUR TRACE (A) 01/19/2021 1001   Sepsis Labs: @LABRCNTIP (procalcitonin:4,lacticidven:4)  ) Recent Results (from the past 240 hours)  Surgical pcr screen     Status: None   Collection Time: 08/17/23  1:29 AM   Specimen: Nasal Mucosa; Nasal Swab  Result Value Ref Range Status   MRSA, PCR NEGATIVE NEGATIVE Final   Staphylococcus aureus NEGATIVE NEGATIVE Final    Comment: (NOTE) The Xpert SA Assay (FDA approved for NASAL specimens in patients 75 years of age and older), is one component of a comprehensive surveillance program. It is not intended to diagnose infection nor to guide or monitor treatment. Performed at [facility], [address] [place], [place] [address]          Radiology Studies: No results found.       Scheduled Meds:  docusate sodium  100 mg Oral BID   enoxaparin (LOVENOX) injection  40 mg Subcutaneous Q24H   haloperidol  2 mg Oral BID   polyvinyl alcohol  1 drop Both Eyes BID   senna-docusate  2 tablet Oral BID   sodium chloride flush  3 mL Intravenous Q12H   traZODone  50 mg Oral QHS   zinc oxide  1 Application Topical TID   Continuous Infusions:     LOS: 5 days    Time spent: 35 minutes.    Berton Mount, MD  [facility] Pager #: [phone] 7PM-7AM contact night coverage as above

## 2023-08-21 NOTE — TOC Progression Note (Signed)
Transition of Care [facility]) - Progression Note    Patient Details  Name: Elizabeth Tucker MRN: [id number] Date of Birth: [birth year]  Transition of Care [facility]) CM/SW Contact  Lorri Frederick, LCSW Phone Number: 08/21/2023, 1:15 PM  Clinical Narrative:     CSW attempted to call pt daughter Glee Arvin, left message.  1145: second call, message sent.  1300: TC Latoya: she does want to pursue SNF, discussed current bed offers.  She asked that medicare ratings be emailed to her at [email or web address], which was done.  She will review options.        Expected Discharge Plan and Services                                               Social Determinants of Health (SDOH) Interventions SDOH Screenings   Food Insecurity: Patient Unable To Answer (08/21/2023)  Housing: Patient Unable To Answer (08/21/2023)  Transportation Needs: Patient Unable To Answer (08/21/2023)  Utilities: Patient Unable To Answer (08/21/2023)  Tobacco Use: Low Risk  (08/18/2023)    Readmission Risk Interventions     No data to display

## 2023-08-21 NOTE — Progress Notes (Signed)
Occupational Therapy Treatment Patient Details Name: Elizabeth Tucker MRN: [id number] DOB: 05/03/1946 Today's Date: 08/21/2023   History of present illness 77 y.o. female presents to [facility] 08/16/23 from SNF after falling w/ R displaced femoral neck fx and mildly displaced fx of proximal phalanx of L 5th toe. S/p THA anterior approach 12/27. Non-op management of L 5th toe. PMHx: L THA 2022, dementia w/ behavioral disturbance   OT comments  Patient able to get to EOB with max assist. Patient demonstrates fair sitting balance on EOB with supervision. Patient was mod assist +2 to stand from EOB and used RW and therapist for balance while standing to perform peri area bathing. Patient attempted transfer to recliner but began having a bowel movement and was transferred to [facility] with mod assist +2 and stood for toilet hygiene with total assist. Patient demonstrating gains with bed mobility, sitting balance, and transfers. skilled nursing facility to address bed mobility, transfers, and self care. Acute OT to continue to follow.      If plan is discharge home, recommend the following:  Two people to help with walking and/or transfers;A lot of help with bathing/dressing/bathroom;Assistance with cooking/housework;Help with stairs or ramp for entrance;Supervision due to cognitive status;Direct supervision/assist for medications management   Equipment Recommendations  None recommended by OT    Recommendations for Other Services      Precautions / Restrictions Precautions Precautions: Fall Precaution Comments: no hip prec, sitter Restrictions Weight Bearing Restrictions Per Provider Order: Yes RLE Weight Bearing Per Provider Order: Weight bearing as tolerated       Mobility Bed Mobility Overal bed mobility: Needs Assistance Bed Mobility: Supine to Sit     Supine to sit: Max assist     General bed mobility comments: assistance with BLEs and trunk to get to EOB with patient having difficulty  following directions for rail use    Transfers Overall transfer level: Needs assistance Equipment used: Rolling walker (2 wheels), 2 person hand held assist Transfers: Sit to/from Stand, Bed to chair/wheelchair/BSC Sit to Stand: Mod assist, +2 safety/equipment Stand pivot transfers: Mod assist, +2 physical assistance         General transfer comment: Mod assist +2 to stand and patient able to maintain balance standing for toilet hygiene with RW and mod assist of another. Mod assist +2 for SPT to [facility] and recliner     Balance Overall balance assessment: Needs assistance Sitting-balance support: No upper extremity supported, Feet supported Sitting balance-Leahy Scale: Fair Sitting balance - Comments: supervision for safety Postural control: Posterior lean Standing balance support: Bilateral upper extremity supported, During functional activity, Reliant on assistive device for balance Standing balance-Leahy Scale: Poor Standing balance comment: reliant on BUE support for balance when standing                           ADL either performed or assessed with clinical judgement   ADL Overall ADL's : Needs assistance/impaired     Grooming: Wash/dry hands;Wash/dry face;Minimal assistance;Cueing for sequencing                   Toilet Transfer: Moderate assistance;+2 for physical assistance;Stand-pivot;BSC/3in1 Toilet Transfer Details (indicate cue type and reason): 2 person HHA to transfer to [facility] Toileting- Clothing Manipulation and Hygiene: Total assistance;Sit to/from stand Toileting - Clothing Manipulation Details (indicate cue type and reason): Patient stood with RW and support of one person with total assist from another for toilet hygiene  Extremity/Trunk Assessment              Vision       Perception     Praxis      Cognition Arousal: Alert Behavior During Therapy: Flat affect Overall Cognitive Status: History of cognitive  impairments - at baseline                                 General Comments: difficulty following commands, limited vocalization initally, more vocal at end of session        Exercises      Shoulder Instructions       General Comments      Pertinent Vitals/ Pain       Pain Assessment Pain Assessment: Faces Faces Pain Scale: Hurts a little bit Pain Location: R LE w/ movement Pain Descriptors / Indicators: Grimacing, Guarding Pain Intervention(s): Monitored during session, Repositioned  Home Living                                          Prior Functioning/Environment              Frequency  Min 1X/week        Progress Toward Goals  OT Goals(current goals can now be found in the care plan section)  Progress towards OT goals: Progressing toward goals  Acute Rehab OT Goals Patient Stated Goal: none stated OT Goal Formulation: Patient unable to participate in goal setting Time For Goal Achievement: 09/02/23 Potential to Achieve Goals: Fair ADL Goals Pt Will Perform Upper Body Dressing: with set-up Pt Will Perform Lower Body Dressing: with min assist;sit to/from stand Pt Will Transfer to Toilet: with contact guard assist;ambulating Additional ADL Goal #1: Pt will be able to perform simple 1 step commands consistently >50% of time.  Plan      Co-evaluation                 AM-PAC OT "6 Clicks" Daily Activity     Outcome Measure   Help from another person eating meals?: A Lot Help from another person taking care of personal grooming?: A Lot Help from another person toileting, which includes using toliet, bedpan, or urinal?: Total Help from another person bathing (including washing, rinsing, drying)?: A Lot Help from another person to put on and taking off regular upper body clothing?: A Lot Help from another person to put on and taking off regular lower body clothing?: Total 6 Click Score: 10    End of Session  Equipment Utilized During Treatment: Gait belt;Rolling walker (2 wheels)  OT Visit Diagnosis: Unsteadiness on feet (R26.81);Other abnormalities of gait and mobility (R26.89);Repeated falls (R29.6);Muscle weakness (generalized) (M62.81);Pain;Other symptoms and signs involving cognitive function Pain - Right/Left: Right Pain - part of body: Hip   Activity Tolerance Patient tolerated treatment well   Patient Left in chair;with call bell/phone within reach;with chair alarm set   Nurse Communication Mobility status        Time: 1610-9604 OT Time Calculation (min): 27 min  Charges: OT General Charges $OT Visit: 1 Visit OT Treatments $Self Care/Home Management : 23-37 mins  Alfonse Flavors, OTA Acute Rehabilitation Services  Office [phone]   Dewain Penning 08/21/2023, 1:11 PM

## 2023-08-22 DIAGNOSIS — S72011A Unspecified intracapsular fracture of right femur, initial encounter for closed fracture: Secondary | ICD-10-CM | POA: Diagnosis not present

## 2023-08-22 NOTE — TOC Progression Note (Signed)
 Transition of Care [facility]) - Progression Note    Patient Details  Name: DOREAN HIEBERT MRN: [id number] Date of Birth: 04-03-1946  [facility]) CM/SW Contact  Bridget Cordella Simmonds, LCSW Phone Number: 08/22/2023, 2:33 PM  Clinical Narrative:   Message sent to daughter Latoya requesting call.  1400: TC Latoya: discussed no additional bed offers, will need to confirm that [facility] and [facility] [employer]. Latoya asked if she can have until tomorrow AM to see if any other offers come in.          Expected Discharge Plan and Services                                               Social Determinants of Health (SDOH) Interventions SDOH Screenings   Food Insecurity: Patient Unable To Answer (08/21/2023)  Housing: Patient Unable To Answer (08/21/2023)  Transportation Needs: Patient Unable To Answer (08/21/2023)  Utilities: Patient Unable To Answer (08/21/2023)  Social Connections: Patient Unable To Answer (08/22/2023)  Tobacco Use: Low Risk  (08/18/2023)    Readmission Risk Interventions     No data to display

## 2023-08-22 NOTE — Progress Notes (Signed)
 Physical Therapy Treatment Patient Details Name: Elizabeth Tucker MRN: [id number] DOB: [birth year] Today's Date: 08/22/2023   History of Present Illness 77 y.o. female presents to [facility] 08/16/23 from SNF after falling w/ R displaced femoral neck fx and mildly displaced fx of proximal phalanx of L 5th toe. S/p THA anterior approach 12/27. Non-op management of L 5th toe. PMHx: L THA 2022, dementia w/ behavioral disturbance    PT Comments  Pt making great progress this session.  Pt continues to be limited due to cognitive deficits but was able to progress gt training this session.  Pt returned to supine.  BM noted at commode and some incontinence while walking.      If plan is discharge home, recommend the following: A lot of help with walking and/or transfers;A lot of help with bathing/dressing/bathroom;Assistance with cooking/housework;Direct supervision/assist for medications management;Direct supervision/assist for financial management;Assist for transportation;Help with stairs or ramp for entrance;Supervision due to cognitive status   Can travel by private vehicle     No  Equipment Recommendations   (TBD at next venue)    Recommendations for Other Services       Precautions / Restrictions Precautions Precautions: Fall Precaution Comments: no hip prec Restrictions Weight Bearing Restrictions Per Provider Order: Yes RLE Weight Bearing Per Provider Order: Weight bearing as tolerated     Mobility  Bed Mobility Overal bed mobility: Needs Assistance Bed Mobility: Supine to Sit, Sit to Supine     Supine to sit: Max assist     General bed mobility comments: Assistance to sequence LEs to edge of bed this session.  Pt able to move to sitting with use of PTA arms for railing to pull into sitting.    Transfers Overall transfer level: Needs assistance Equipment used: Rolling walker (2 wheels) Transfers: Sit to/from Stand, Bed to chair/wheelchair/BSC Sit to Stand: Min assist, Mod  assist           General transfer comment: Cues for hand placement with hand over hand placement at times.  Pt required increased assistance from commode to rise into standing.    Ambulation/Gait Ambulation/Gait assistance: Mod assist Gait Distance (Feet): 16 Feet (+ 8 ft) Assistive device: Rolling walker (2 wheels) Gait Pattern/deviations: Step-to pattern, Decreased weight shift to right, Trunk flexed, Narrow base of support, Shuffle       General Gait Details: ModA for safety w/ multiple cues for sequencing and RW management.   Stairs             Wheelchair Mobility     Tilt Bed    Modified Rankin (Stroke Patients Only)       Balance Overall balance assessment: Needs assistance   Sitting balance-Leahy Scale: Fair       Standing balance-Leahy Scale: Poor                              Cognition Arousal: Alert Behavior During Therapy: Flat affect Overall Cognitive Status: History of cognitive impairments - at baseline                                 General Comments: difficulty following commands, limited vocalization initally, more vocal at end of session        Exercises General Exercises - Lower Extremity Ankle Circles/Pumps: AROM, Both, 10 reps, Supine Heel Slides: AROM, Both, 10 reps, Supine Hip ABduction/ADduction: AROM, Both, 10 reps,  Supine Straight Leg Raises: AAROM, Both, 10 reps, Supine, Limitations (B extensor lag noted)    General Comments        Pertinent Vitals/Pain Pain Assessment Pain Assessment: Faces Pain Score: 0-No pain    Home Living                          Prior Function            PT Goals (current goals can now be found in the care plan section) Acute Rehab PT Goals Patient Stated Goal: pt not able to participate Potential to Achieve Goals: Fair Progress towards PT goals: Progressing toward goals    Frequency    Min 1X/week      PT Plan      Co-evaluation               AM-PAC PT 6 Clicks Mobility   Outcome Measure  Help needed turning from your back to your side while in a flat bed without using bedrails?: A Lot Help needed moving from lying on your back to sitting on the side of a flat bed without using bedrails?: A Lot Help needed moving to and from a bed to a chair (including a wheelchair)?: A Little Help needed standing up from a chair using your arms (e.g., wheelchair or bedside chair)?: A Little Help needed to walk in hospital room?: A Little Help needed climbing 3-5 steps with a railing? : A Lot 6 Click Score: 15    End of Session Equipment Utilized During Treatment: Gait belt Activity Tolerance: Patient tolerated treatment well Patient left: in bed;with call bell/phone within reach;with nursing/sitter in room;with bed alarm set;with family/visitor present Nurse Communication: Mobility status (blood tinge in BM) PT Visit Diagnosis: Unsteadiness on feet (R26.81);History of falling (Z91.81);Muscle weakness (generalized) (M62.81)     Time: 8392-8354 PT Time Calculation (min) (ACUTE ONLY): 38 min  Charges:    $Gait Training: 8-22 mins $Therapeutic Exercise: 8-22 mins $Therapeutic Activity: 8-22 mins PT General Charges $$ ACUTE PT VISIT: 1 Visit                     Elizabeth HAMS , PTA Acute Rehabilitation Services Office [phone]    Elizabeth Tucker 08/22/2023, 4:50 PM

## 2023-08-22 NOTE — Progress Notes (Signed)
 [facility] Hospitalized Hospice Patient?Note   Rasheedah Reis is a current hospice patient, followed at [facility] in their memory care unit with hospice terminal diagnosis of abnormal weight loss.   Patient suffered a fall at her facility 12.24.24.  Facility sent her to the ED for evaluation as she was unable to bear weight on left lower extremity.  She was admitted to [facility] on 12.25.24 with diagnosis of closed subcapital fracture of neck of right femur.  [facility] was notified upon her arrival to ED. Per Dr. Norleen Laurence, hospice physician, this is a related hospital admission.    During visit today, patient is alert and responsive without distress. No sitter present during visit. No family at bedside. TOC confirms that placement efforts are for skilled services and we would need to revoke hospice services if a bed is accepted.   Patient is appropriate for GIP requiring frequent skilled assessment and monitoring in a post operative patient with behavioral disturbances in the setting of advanced dementia.   Vital Signs: 98.2/78/16    102/58   Abnormal Labs: No new   I&O: Not Documented/1000  Diagnostics:  No new   IV/PRN Meds: Haldol  2mg  PO BID  (From progress note 12.30.24, no updated note available at time of this documentation)  Fall at home w/ R femur fracture  - NPO for surgery on Fri 08/18/2023 w/ Dr. Jerri from orthopedic surgery - Tylenol  PRN  - Celebrex  200 mg PO bid  - IV D5-LR 20K+ @ 75 cc/hr  - IV morphine  1 mg q1 hr PRN  08/21/2023: Pain is controlled.  Pursue disposition.   Dementia with behavioral disturbance (HCC) - Haldol  2 mg PO bid    Discharge Plan- Likely to STR.  Goals of care:  ongoing  IDT:  updated  Family contact:  See above.   If patient requires ambulance transport at discharge, please use [facility].   Please call with any hospice related questions or concerns.    Elouise Husband, BSN, RN, [facility] [phone]

## 2023-08-22 NOTE — Progress Notes (Signed)
 PROGRESS NOTE    Elizabeth Tucker  FMW:[id number] DOB: Mar 21, 1946 DOA: 08/16/2023 PCP: No primary care provider on file.  Outpatient Specialists:     Brief Narrative:  Patient is a 77 year old female, skilled nursing facility resident, with past medical history significant for dementia, nonverbal at baseline, GERD, and hep C.  Patient was admitted with right displaced femoral neck fracture following a fall.  Patient has undergone partial hip arthroplasty.  Input from orthopedic surgery turning appreciated.  08/22/2023: Patient seen.  No new changes.  Awaiting SNF placement.     Assessment & Plan:   Principal Problem:   Closed subcapital fracture of neck of femur, right, initial encounter [facility]) Active Problems:   Dementia with behavioral disturbance (HCC)   Fall at home, initial encounter   Fall   Closed fracture of fifth toe of left foot   Fall at home w/ R femur fracture  -Patient underwent partial right hip arthroplasty. -Pain is controlled. -Minimal complication from patient.   -Awaiting disposition.   -TOC input is highly appreciated.     Dementia with behavioral disturbance (HCC) - Haldol  2 mg PO bid      DVT prophylaxis: Subcutaneous Lovenox . Code Status: DO NOT RESUSCITATE Family Communication:  Disposition Plan:    Consultants:  Orthopedic surgery.  Procedures:  Partial right hip arthroplasty.  Antimicrobials:  IV cefazolin  X 3 doses (completed).   Subjective: No complaints. Poor historian.  Objective: Vitals:   08/21/23 1029 08/21/23 1643 08/22/23 0723 08/22/23 1100  BP: 104/72 113/75 95/70 (!) 102/58  Pulse: 79 85 83 78  Resp: 16 16  16   Temp: 98.1 F (36.7 C) 98.3 F (36.8 C) 98.1 F (36.7 C) 98.2 F (36.8 C)  TempSrc: Oral Oral Oral Oral  SpO2: 98% 99% 100% 100%    Intake/Output Summary (Last 24 hours) at 08/22/2023 1807 Last data filed at 08/22/2023 0845 Gross per 24 hour  Intake 120 ml  Output 1000 ml  Net -880 ml    There  were no vitals filed for this visit.  Examination:  General exam: Appears calm and comfortable  Respiratory system: Clear to auscultation.  Cardiovascular system: S1 & S2  Gastrointestinal system: Abdomen is soft and nontender.   Central nervous system: Awake.      Data Reviewed: I have personally reviewed following labs and imaging studies  CBC: Recent Labs  Lab 08/16/23 1426 08/18/23 1648 08/19/23 0534 08/20/23 0447  WBC 4.1 4.4 3.8* 3.5*  NEUTROABS 3.2  --   --   --   HGB 13.1 11.7* 11.1* 11.4*  HCT 37.6 34.2* 32.1* 32.7*  MCV 89.5 90.0 87.7 87.9  PLT 108* 94* 101* 101*   Basic Metabolic Panel: Recent Labs  Lab 08/16/23 1426 08/18/23 1648 08/19/23 0534 08/20/23 0447  NA 139  --  133* 135  K 3.9  --  4.1 4.0  CL 106  --  104 106  CO2 24  --  23 22  GLUCOSE 106*  --  122* 115*  BUN 18  --  13 14  CREATININE 0.55 0.64 0.77 0.63  CALCIUM 8.9  --  8.6* 8.5*   GFR: CrCl cannot be calculated (Unknown ideal weight.). Liver Function Tests: No results for input(s): AST, ALT, ALKPHOS, BILITOT, PROT, ALBUMIN  in the last 168 hours. No results for input(s): LIPASE, AMYLASE in the last 168 hours. No results for input(s): AMMONIA in the last 168 hours. Coagulation Profile: Recent Labs  Lab 08/16/23 1832  INR 1.2   Cardiac  Enzymes: No results for input(s): CKTOTAL, CKMB, CKMBINDEX, TROPONINI in the last 168 hours. BNP (last 3 results) No results for input(s): PROBNP in the last 8760 hours. HbA1C: No results for input(s): HGBA1C in the last 72 hours. CBG: No results for input(s): GLUCAP in the last 168 hours. Lipid Profile: No results for input(s): CHOL, HDL, LDLCALC, TRIG, CHOLHDL, LDLDIRECT in the last 72 hours. Thyroid Function Tests: No results for input(s): TSH, T4TOTAL, FREET4, T3FREE, THYROIDAB in the last 72 hours. Anemia Panel: No results for input(s): VITAMINB12, FOLATE, FERRITIN, TIBC,  IRON, RETICCTPCT in the last 72 hours. Urine analysis:    Component Value Date/Time   COLORURINE YELLOW 01/19/2021 1001   APPEARANCEUR CLEAR 01/19/2021 1001   LABSPEC 1.013 01/19/2021 1001   PHURINE 6.0 01/19/2021 1001   GLUCOSEU NEGATIVE 01/19/2021 1001   HGBUR NEGATIVE 01/19/2021 1001   HGBUR negative 02/01/2008 1038   BILIRUBINUR NEGATIVE 01/19/2021 1001   KETONESUR NEGATIVE 01/19/2021 1001   PROTEINUR NEGATIVE 01/19/2021 1001   UROBILINOGEN 1.0 08/14/2008 0851   NITRITE NEGATIVE 01/19/2021 1001   LEUKOCYTESUR TRACE (A) 01/19/2021 1001   Sepsis Labs: @LABRCNTIP (procalcitonin:4,lacticidven:4)  ) Recent Results (from the past 240 hours)  Surgical pcr screen     Status: None   Collection Time: 08/17/23  1:29 AM   Specimen: Nasal Mucosa; Nasal Swab  Result Value Ref Range Status   MRSA, PCR NEGATIVE NEGATIVE Final   Staphylococcus aureus NEGATIVE NEGATIVE Final    Comment: (NOTE) The Xpert SA Assay (FDA approved for NASAL specimens in patients 31 years of age and older), is one component of a comprehensive surveillance program. It is not intended to diagnose infection nor to guide or monitor treatment. Performed at [facility], [address] [place], [place] [address]          Radiology Studies: No results found.       Scheduled Meds:  docusate sodium   100 mg Oral BID   enoxaparin  (LOVENOX ) injection  40 mg Subcutaneous Q24H   haloperidol   2 mg Oral BID   polyvinyl alcohol   1 drop Both Eyes BID   senna-docusate  2 tablet Oral BID   sodium chloride  flush  3 mL Intravenous Q12H   traZODone   50 mg Oral QHS   zinc  oxide  1 Application Topical TID   Continuous Infusions:     LOS: 6 days    Time spent: 35 minutes.    Leatrice Chapel, MD  [facility] Pager #: [phone] 7PM-7AM contact night coverage as above

## 2023-08-23 DIAGNOSIS — S72011A Unspecified intracapsular fracture of right femur, initial encounter for closed fracture: Secondary | ICD-10-CM | POA: Diagnosis not present

## 2023-08-23 NOTE — Progress Notes (Signed)
 [facility] Hospitalized Hospice Patient?Note   Elizabeth Tucker is a current hospice patient, followed at [facility] in their memory care unit with hospice terminal diagnosis of abnormal weight loss.   Patient suffered a fall at her facility 12.24.24.  Facility sent her to the ED for evaluation as she was unable to bear weight on left lower extremity.  She was admitted to [facility] on 12.25.24 with diagnosis of closed subcapital fracture of neck of right femur.  [facility] was notified upon her arrival to ED. Per Dr. Norleen Laurence, hospice physician, this is a related hospital admission.    During visit today, patient is alert and responsive without distress. No sitter present during visit. No family at bedside. TOC confirms that placement efforts are for skilled services, and we would need to revoke hospice services if a bed is accepted. Patient's daughter and TOC continue to work towards placement.    Patient is appropriate for GIP requiring frequent skilled assessment and monitoring in a post operative patient with behavioral disturbances in the setting of advanced dementia.   Vital Signs: 98/71/18   91/49   99% on RA   Abnormal Labs: No new   I&O: 240/not documented   Diagnostics:  No new   IV/PRN Meds: Haldol  2mg  PO BID   Problem list per progress note Rosario Leatrice FERNS, MD  12.31.24  Assessment & Plan: Principal Problem:   Closed subcapital fracture of neck of femur, right, initial encounter [facility]) Active Problems:   Dementia with behavioral disturbance (HCC)   Fall at home, initial encounter   Fall   Closed fracture of fifth toe of left foot    Fall at home w/ R femur fracture  -Patient underwent partial right hip arthroplasty. -Pain is controlled. -Minimal complication from patient.   -Awaiting disposition.   -TOC input is highly appreciated.     Dementia with behavioral disturbance (HCC) - Haldol  2 mg PO bid    Discharge Plan- Likely to STR.    Goals of care:  ongoing   IDT:  updated   Family contact:  See above.   If patient requires ambulance transport at discharge, please use [facility].   Please call with any hospice related questions or concerns.    Eleanor Nail, LPN [facility] Liaison [phone]

## 2023-08-23 NOTE — Plan of Care (Signed)

## 2023-08-23 NOTE — Progress Notes (Signed)
 [facility] PROGRESS NOTE    Progress Note  Elizabeth Tucker  FMW:[id number] DOB: 1946/04/25 DOA: 08/16/2023 PCP: No primary care provider on file.     Brief Narrative:   Elizabeth Tucker is an 78 y.o. female past medical history significant for dementia nonverbal baseline hepatitis C admitted for right displaced femoral neck fracture after mechanical fall status post partial hip arthroplasty   Assessment/Plan:   Closed subcapital fracture of neck of femur, right, initial encounter (HCC) Status post partial hip arthroplasty by orthopedic surgery. Anticoagulation and analgesics per orthopedic surgery. Hemoglobin is stable. Orthopedic surgery recommended Lovenox  subcutaneously  Mechanical fall: PT OT evaluated the patient awaiting skilled nursing facility placement.  Dementia with behavioral disturbance (HCC) High risk of acute confusional state continue Haldol  as needed. Melatonin at night.  DVT prophylaxis: lovenox  Family Communication:none Status is: Inpatient Remains inpatient appropriate because: Awaiting skilled nursing facility placement.    Code Status:     Code Status Orders  (From admission, onward)           Start     Ordered   08/16/23 1746  Do not attempt resuscitation (DNR)- Limited -Do Not Intubate (DNI)  Continuous       Question Answer Comment  If pulseless and not breathing No CPR or chest compressions.   In Pre-Arrest Conditions (Patient Is Breathing and Has A Pulse) Do not intubate. Provide all appropriate non-invasive medical interventions. Avoid ICU transfer unless indicated or required.   Consent: Discussion documented in EHR or advanced directives reviewed      08/16/23 1747           Code Status History     Date Active Date Inactive Code Status Order ID Comments User Context   01/19/2021 1002 01/27/2021 2226 Full Code [id number]  Barbarann Nest, MD ED      Advance Directive Documentation    Flowsheet Row Most Recent Value   Type of Advance Directive Healthcare Power of Attorney  Pre-existing out of facility DNR order (yellow form or pink MOST form) --  MOST Form in Place? --         IV Access:   Peripheral IV   Procedures and diagnostic studies:   No results found.   Medical Consultants:   None.   Subjective:    Elizabeth Tucker no complaints today.  Objective:    Vitals:   08/22/23 1100 08/22/23 2100 08/23/23 0419 08/23/23 0710  BP: (!) 102/58 111/62 95/61 (!) 91/49  Pulse: 78 80 69 71  Resp: 16  18 18   Temp: 98.2 F (36.8 C) 98.2 F (36.8 C) 98.5 F (36.9 C) 98 F (36.7 C)  TempSrc: Oral Oral Oral Axillary  SpO2: 100% 96% 99% 99%   SpO2: 99 %   Intake/Output Summary (Last 24 hours) at 08/23/2023 9180 Last data filed at 08/22/2023 2200 Gross per 24 hour  Intake 240 ml  Output --  Net 240 ml   There were no vitals filed for this visit.  Exam: General exam: In no acute distress. Respiratory system: Good air movement and clear to auscultation. Cardiovascular system: S1 & S2 heard, RRR. No JVD.  Gastrointestinal system: Abdomen is nondistended, soft and nontender.  Extremities: No pedal edema. Skin: No rashes, lesions or ulcers Psychiatry: Judgement and insight appear normal. Mood & affect appropriate.    Data Reviewed:    Labs: Basic Metabolic Panel: Recent Labs  Lab 08/16/23 1426 08/18/23 1648 08/19/23 0534 08/20/23 0447  NA 139  --  133* 135  K 3.9  --  4.1 4.0  CL 106  --  104 106  CO2 24  --  23 22  GLUCOSE 106*  --  122* 115*  BUN 18  --  13 14  CREATININE 0.55 0.64 0.77 0.63  CALCIUM 8.9  --  8.6* 8.5*   GFR CrCl cannot be calculated (Unknown ideal weight.). Liver Function Tests: No results for input(s): AST, ALT, ALKPHOS, BILITOT, PROT, ALBUMIN  in the last 168 hours. No results for input(s): LIPASE, AMYLASE in the last 168 hours. No results for input(s): AMMONIA in the last 168 hours. Coagulation profile Recent Labs   Lab 08/16/23 1832  INR 1.2   COVID-19 Labs  No results for input(s): DDIMER, FERRITIN, LDH, CRP in the last 72 hours.  Lab Results  Component Value Date   SARSCOV2NAA NEGATIVE 01/27/2021   SARSCOV2NAA NEGATIVE 01/24/2021   SARSCOV2NAA NEGATIVE 01/19/2021    CBC: Recent Labs  Lab 08/16/23 1426 08/18/23 1648 08/19/23 0534 08/20/23 0447  WBC 4.1 4.4 3.8* 3.5*  NEUTROABS 3.2  --   --   --   HGB 13.1 11.7* 11.1* 11.4*  HCT 37.6 34.2* 32.1* 32.7*  MCV 89.5 90.0 87.7 87.9  PLT 108* 94* 101* 101*   Cardiac Enzymes: No results for input(s): CKTOTAL, CKMB, CKMBINDEX, TROPONINI in the last 168 hours. BNP (last 3 results) No results for input(s): PROBNP in the last 8760 hours. CBG: No results for input(s): GLUCAP in the last 168 hours. D-Dimer: No results for input(s): DDIMER in the last 72 hours. Hgb A1c: No results for input(s): HGBA1C in the last 72 hours. Lipid Profile: No results for input(s): CHOL, HDL, LDLCALC, TRIG, CHOLHDL, LDLDIRECT in the last 72 hours. Thyroid function studies: No results for input(s): TSH, T4TOTAL, T3FREE, THYROIDAB in the last 72 hours.  Invalid input(s): FREET3 Anemia work up: No results for input(s): VITAMINB12, FOLATE, FERRITIN, TIBC, IRON, RETICCTPCT in the last 72 hours. Sepsis Labs: Recent Labs  Lab 08/16/23 1426 08/18/23 1648 08/19/23 0534 08/20/23 0447  WBC 4.1 4.4 3.8* 3.5*   Microbiology Recent Results (from the past 240 hours)  Surgical pcr screen     Status: None   Collection Time: 08/17/23  1:29 AM   Specimen: Nasal Mucosa; Nasal Swab  Result Value Ref Range Status   MRSA, PCR NEGATIVE NEGATIVE Final   Staphylococcus aureus NEGATIVE NEGATIVE Final    Comment: (NOTE) The Xpert SA Assay (FDA approved for NASAL specimens in patients 47 years of age and older), is one component of a comprehensive surveillance program. It is not intended to diagnose infection nor  to guide or monitor treatment. Performed at [facility], [address] [place], [place] [address]      Medications:    docusate sodium   100 mg Oral BID   enoxaparin  (LOVENOX ) injection  40 mg Subcutaneous Q24H   haloperidol   2 mg Oral BID   polyvinyl alcohol   1 drop Both Eyes BID   senna-docusate  2 tablet Oral BID   sodium chloride  flush  3 mL Intravenous Q12H   traZODone   50 mg Oral QHS   zinc  oxide  1 Application Topical TID   Continuous Infusions:    LOS: 7 days   Erle Odell Castor  [facility]  08/23/2023, 8:19 AM

## 2023-08-23 NOTE — TOC Progression Note (Signed)
 Transition of Care [facility]) - Progression Note    Patient Details  Name: Elizabeth Tucker MRN: [id number] Date of Birth: 1945-11-09  [facility]) CM/SW Contact  Bridget Cordella Simmonds, LCSW Phone Number: 08/23/2023, 12:19 PM  Clinical Narrative:   CSW confirmed with [facility] and [facility] that they can accept [employer] medicare.    Message left with daughter Sharene regarding facility choice.           Expected Discharge Plan and Services                                               Social Determinants of Health (SDOH) Interventions SDOH Screenings   Food Insecurity: Patient Unable To Answer (08/21/2023)  Housing: Patient Unable To Answer (08/21/2023)  Transportation Needs: Patient Unable To Answer (08/21/2023)  Utilities: Patient Unable To Answer (08/21/2023)  Social Connections: Patient Unable To Answer (08/22/2023)  Tobacco Use: Low Risk  (08/18/2023)    Readmission Risk Interventions     No data to display

## 2023-08-24 DIAGNOSIS — S72011A Unspecified intracapsular fracture of right femur, initial encounter for closed fracture: Secondary | ICD-10-CM | POA: Diagnosis not present

## 2023-08-24 MED ORDER — POLYETHYLENE GLYCOL 3350 17 G PO PACK: 17.0000 g | PACK | Freq: Every day | ORAL | 0 refills | Status: AC | PRN

## 2023-08-24 NOTE — TOC Transition Note (Signed)
 Transition of Care [facility]) - Discharge Note   Patient Details  Name: Elizabeth Tucker MRN: [id number] Date of Birth: 06-Jun-1946  Transition of Care [facility]) CM/SW Contact:  Bridget Cordella Simmonds, LCSW Phone Number: 08/24/2023, 1:30 PM   Clinical Narrative:   Pt discharging to [facility].  RN call report to [phone].      Final next level of care: Skilled Nursing Facility Barriers to Discharge: Barriers Resolved   Patient Goals and CMS Choice            Discharge Placement              Patient chooses bed at: [facility] Patient to be transferred to facility by: ptar Name of family member notified: daughter Sharene Patient and family notified of of transfer: 08/24/23  Discharge Plan and Services Additional resources added to the After Visit Summary for                                       Social Drivers of Health (SDOH) Interventions SDOH Screenings   Food Insecurity: Patient Unable To Answer (08/21/2023)  Housing: Patient Unable To Answer (08/21/2023)  Transportation Needs: Patient Unable To Answer (08/21/2023)  Utilities: Patient Unable To Answer (08/21/2023)  Social Connections: Patient Unable To Answer (08/22/2023)  Tobacco Use: Low Risk  (08/18/2023)     Readmission Risk Interventions     No data to display

## 2023-08-24 NOTE — Progress Notes (Signed)
 Occupational Therapy Treatment Patient Details Name: Elizabeth Tucker MRN: [id number] DOB: 12-17-1945 Today's Date: 08/24/2023   History of present illness 78 y.o. female presents to [facility]/16/[facility] from SNF after falling w/ R displaced femoral neck fx and mildly displaced fx of proximal phalanx of L 5th toe. S/p THA anterior approach 12/27. Non-op management of L 5th toe. PMHx: L THA 2022, dementia w/ behavioral disturbance   OT comments  Patient received in supine and agreeable to OT treatment. Patient requiring max assist to get to EOB and able to maintain sitting balance with supervision. Patient instructed on hand placement and min/mod assist to stand and mod assist for transfer to recliner. Staff state they have been feeding patient. OT provided setup and cut up food for patient with patient able to feed self with occasional cue to continue task and occasional assistance with milk. Patient was able to feed self 75% of meal. Patient will benefit from continued inpatient follow up therapy, <3 hours/day to address bed mobility, transfers, and self care. Acute OT to continue to follo.       If plan is discharge home, recommend the following:  Two people to help with walking and/or transfers;A lot of help with bathing/dressing/bathroom;Assistance with cooking/housework;Help with stairs or ramp for entrance;Supervision due to cognitive status;Direct supervision/assist for medications management   Equipment Recommendations  None recommended by OT    Recommendations for Other Services      Precautions / Restrictions Precautions Precautions: Fall Precaution Comments: no hip prec Restrictions Weight Bearing Restrictions Per Provider Order: Yes RLE Weight Bearing Per Provider Order: Weight bearing as tolerated       Mobility Bed Mobility Overal bed mobility: Needs Assistance Bed Mobility: Supine to Sit     Supine to sit: Max assist     General bed mobility comments: cues throughout with  limited patient participation    Transfers Overall transfer level: Needs assistance Equipment used: Rolling walker (2 wheels) Transfers: Sit to/from Stand, Bed to chair/wheelchair/BSC Sit to Stand: Min assist, Mod assist, From elevated surface     Step pivot transfers: Mod assist     General transfer comment: cues for hand placement and assistance with balance and managing RW     Balance Overall balance assessment: Needs assistance Sitting-balance support: No upper extremity supported, Feet supported Sitting balance-Leahy Scale: Fair Sitting balance - Comments: supervision for safety   Standing balance support: Bilateral upper extremity supported, During functional activity, Reliant on assistive device for balance Standing balance-Leahy Scale: Poor Standing balance comment: reliant on RW for support                           ADL either performed or assessed with clinical judgement   ADL Overall ADL's : Needs assistance/impaired Eating/Feeding: Set up;Supervision/ safety;Sitting Eating/Feeding Details (indicate cue type and reason): able to feed self with setup and occasional assistance for holding milk Grooming: Wash/dry hands;Wash/dry face;Set up;Sitting               Lower Body Dressing: Total assistance;Sitting/lateral leans Lower Body Dressing Details (indicate cue type and reason): to donn socks                    Extremity/Trunk Assessment              Vision       Perception     Praxis      Cognition Arousal: Alert Behavior During Therapy: Flat affect  Overall Cognitive Status: History of cognitive impairments - at baseline                                 General Comments: limited vocalization, increased time to follow commands        Exercises      Shoulder Instructions       General Comments      Pertinent Vitals/ Pain       Pain Assessment Pain Assessment: Faces Faces Pain Scale: Hurts a little  bit Pain Location: R LE w/ movement Pain Descriptors / Indicators: Grimacing, Guarding Pain Intervention(s): Monitored during session, Repositioned  Home Living                                          Prior Functioning/Environment              Frequency  Min 1X/week        Progress Toward Goals  OT Goals(current goals can now be found in the care plan section)  Progress towards OT goals: Progressing toward goals  Acute Rehab OT Goals Patient Stated Goal: none stated OT Goal Formulation: Patient unable to participate in goal setting Time For Goal Achievement: 09/02/23 Potential to Achieve Goals: Fair ADL Goals Pt Will Perform Upper Body Dressing: with set-up Pt Will Perform Lower Body Dressing: with min assist;sit to/from stand Pt Will Transfer to Toilet: with contact guard assist;ambulating Additional ADL Goal #1: Pt will be able to perform simple 1 step commands consistently >50% of time.  Plan      Co-evaluation                 AM-PAC OT 6 Clicks Daily Activity     Outcome Measure   Help from another person eating meals?: A Little Help from another person taking care of personal grooming?: A Little Help from another person toileting, which includes using toliet, bedpan, or urinal?: A Lot Help from another person bathing (including washing, rinsing, drying)?: A Lot Help from another person to put on and taking off regular upper body clothing?: A Lot Help from another person to put on and taking off regular lower body clothing?: Total 6 Click Score: 13    End of Session Equipment Utilized During Treatment: Gait belt;Rolling walker (2 wheels)  OT Visit Diagnosis: Unsteadiness on feet (R26.81);Other abnormalities of gait and mobility (R26.89);Repeated falls (R29.6);Muscle weakness (generalized) (M62.81);Pain;Other symptoms and signs involving cognitive function Pain - Right/Left: Right Pain - part of body: Hip   Activity Tolerance  Patient tolerated treatment well   Patient Left in chair;with call bell/phone within reach;with chair alarm set   Nurse Communication Mobility status;Other (comment) (patient able to feed self if OOB in chair)        Time: 9271-9196 OT Time Calculation (min): 35 min  Charges: OT General Charges $OT Visit: 1 Visit OT Treatments $Self Care/Home Management : 23-37 mins  Dick Laine, OTA Acute Rehabilitation Services  Office [phone]   Jeb LITTIE Laine 08/24/2023, 10:38 AM

## 2023-08-24 NOTE — Progress Notes (Signed)
 PT Cancellation Note  Patient Details Name: Elizabeth Tucker MRN: [id number] DOB: 04-03-1946   Cancelled Treatment:    Reason Eval/Treat Not Completed: Patient declined, stated she just wanted to sleep. Will re-attempt as schedule allows.   Elizabeth ORN, PT, DPT Secure Chat Preferred  Rehab Office [phone]   Elizabeth Tucker [name] 08/24/2023, 12:53 PM

## 2023-08-24 NOTE — TOC Progression Note (Signed)
 Transition of Care [facility]) - Progression Note    Patient Details  Name: Elizabeth MICHAELIS MRN: [id number] Date of Birth: 10-30-1945  [facility] CM/SW Contact  Bridget Cordella Simmonds, LCSW Phone Number: 08/24/2023, 1:21 PM  Clinical Narrative:   Chat with Shawn/Melissa/[facility]: they will work on leisure centre manager.  0900: message from Melissa: once hospice is revoked, pt will have traditional medicare until end of January.  At that point her medicare will revert to [facility].  0940: message with Kia/[facility] to confirm they can receive pt today.  Discussed that pt will be traditional medicare upon revocation, no SNF auth needed.  [facility] will confirm this.   1000: FF Angela/[facility].  She just met with pt.  Also confirmed that [facility] can move forward admitting pt under traditional medicare.    1100: MD notified.  1120: message Kia, need revocation form.  CSW spoke with daughter Sharene and she will get with hospice to sign form.    1300: message Melissa: revocation form has been signed by daughter Sharene. CSW confirmed with Kia that she has her paperwork and is ready to receive pt.         Expected Discharge Plan and Services         Expected Discharge Date: 08/24/23                                     Social Determinants of Health (SDOH) Interventions SDOH Screenings   Food Insecurity: Patient Unable To Answer (08/21/2023)  Housing: Patient Unable To Answer (08/21/2023)  Transportation Needs: Patient Unable To Answer (08/21/2023)  Utilities: Patient Unable To Answer (08/21/2023)  Social Connections: Patient Unable To Answer (08/22/2023)  Tobacco Use: Low Risk  (08/18/2023)    Readmission Risk Interventions     No data to display

## 2023-08-24 NOTE — Discharge Summary (Signed)
 Physician Discharge Summary  Elizabeth Tucker FMW:[id number] DOB: 1945/09/21 DOA: 08/16/2023  PCP: No primary care provider on file.  Admit date: 08/16/2023 Discharge date: 08/24/2023  Admitted From: Home Disposition:  Home  Recommendations for Outpatient Follow-up:  Follow up with PCP in 1-2 weeks Please obtain BMP/CBC in one week   Home Health:No Equipment/Devices:none  Discharge Condition:Stable CODE STATUS:DNR Diet recommendation: Heart Healthy  Brief/Interim Summary: 78 y.o. female past medical history significant for dementia nonverbal baseline hepatitis C admitted for right displaced femoral neck fracture after mechanical fall status post partial hip arthroplasty   Discharge Diagnoses:  Principal Problem:   Closed subcapital fracture of neck of femur, right, initial encounter [facility]) Active Problems:   Dementia with behavioral disturbance (HCC)   Fall at home, initial encounter   Fall   Closed fracture of fifth toe of left foot  Closed subcapital left Fracture: Seen on x-ray with surgery was consulted recommended total hip arthroplasty done on 08/23/2023. PT evaluated the patient will need skilled nursing facility. Will send in bowel regimen and she is on narcotics for pain control.  Mechanical fall: PT OT elevated the patient will need skilled nursing facility.  Dementia with behavioral disturbances: She had no events overnight. With no events.  Discharge Instructions  Discharge Instructions     Amb Referral to Osteoporosis Management    Complete by: As directed    Diet - low sodium heart healthy   Complete by: As directed    Increase activity slowly   Complete by: As directed    Weight bearing as tolerated   Complete by: As directed       Allergies as of 08/24/2023       Reactions   Sulfonamide Derivatives Other (See Comments)   Childhood allergy         Medication List     TAKE these medications    enoxaparin  40 MG/0.4ML injection Commonly  known as: LOVENOX  Inject 0.4 mLs (40 mg total) into the skin daily for 14 days.   guaifenesin 100 MG/5ML syrup Commonly known as: ROBITUSSIN Take 200 mg by mouth every 4 (four) hours as needed for cough.   haloperidol  2 MG tablet Commonly known as: HALDOL  Take 2 mg by mouth 2 (two) times daily.   HYDROcodone -acetaminophen  5-325 MG tablet Commonly known as: Norco Take 1 tablet by mouth every 6 (six) hours as needed.   miconazole  2 % powder Commonly known as: MICOTIN Apply 1 Application topically in the morning and at bedtime. Prn order: 1 application every 8 hours as needed for rash  Apply to breast and umbilical folds   polyethylene glycol 17 g packet Commonly known as: MIRALAX  / GLYCOLAX  Take 17 g by mouth daily as needed for mild constipation.   Systane Balance 0.6 % Soln Generic drug: Propylene Glycol Apply to eye.   traZODone  50 MG tablet Commonly known as: DESYREL  Take 50 mg by mouth at bedtime.   UNABLE TO FIND Take 1 Bottle by mouth in the morning, at noon, and at bedtime. Med Name: mighty shake   zinc  oxide 20 % ointment Apply 1 Application topically See admin instructions. 1 application to buttocks and peri area every shift               Discharge Care Instructions  (From admission, onward)           Start     Ordered   08/18/23 0000  Weight bearing as tolerated  08/18/23 1156            Follow-up Information     Jule Ronal CROME, PA-C Follow up in 2 week(s).   Specialty: Orthopedic Surgery Why: For suture removal, For wound re-check Contact information: [address] [place]  [place] [address] [phone]                Allergies  Allergen Reactions   Sulfonamide Derivatives Other (See Comments)    Childhood allergy     Consultations: Orthopedic surgery   Procedures/Studies: DG Pelvis Portable Result Date: 08/18/2023 CLINICAL DATA:  [id number] History of open reduction and internal fixation (ORIF) procedure  [id number] EXAM: PORTABLE PELVIS 1-2 VIEWS COMPARISON:  08/16/2023 FINDINGS: Interval postsurgical changes from right hip hemiarthroplasty. Arthroplasty components are in their expected alignment. No periprosthetic fracture or evidence of other complication. Expected postoperative changes within the overlying soft tissues. IMPRESSION: Interval postsurgical changes from right hip hemiarthroplasty without evidence of complication. Electronically Signed   By: Mabel Converse D.O.   On: 08/18/2023 18:43   DG HIP UNILAT WITH PELVIS 1V RIGHT Result Date: 08/18/2023 CLINICAL DATA:  Elective surgery. EXAM: DG HIP (WITH OR WITHOUT PELVIS) 1V RIGHT COMPARISON:  Preoperative imaging. FINDINGS: Five fluoroscopic spot views of the pelvis and right hip obtained in the operating room. Sequential images during hip arthroplasty. Fluoroscopy time 7.4 seconds. Dose 0.5109 mGy. IMPRESSION: Intraoperative fluoroscopy during right hip arthroplasty. Electronically Signed   By: Andrea Gasman M.D.   On: 08/18/2023 14:04   DG C-Arm 1-60 Min-No Report Result Date: 08/18/2023 Fluoroscopy was utilized by the requesting physician.  No radiographic interpretation.   CT Cervical Spine Wo Contrast Result Date: 08/16/2023 CLINICAL DATA:  Mechanical fall yesterday.  Toe fracture. EXAM: CT CERVICAL SPINE WITHOUT CONTRAST TECHNIQUE: Multidetector CT imaging of the cervical spine was performed without intravenous contrast. Multiplanar CT image reconstructions were also generated. RADIATION DOSE REDUCTION: This exam was performed according to the departmental dose-optimization program which includes automated exposure control, adjustment of the mA and/or kV according to patient size and/or use of iterative reconstruction technique. COMPARISON:  CT of the cervical spine 01/19/2021 FINDINGS: Alignment: Slight degenerative retrolisthesis at C3-4 and C4-5 is stable. Exaggerated cervical lordosis and upper thoracic kyphosis is noted. Slight  anterolisthesis at C7-T1 is noted. Skull base and vertebrae: Superior endplate Schmorl's noted T1 is stable. Chronic endplate degenerative changes at C4-5 are stable. No acute fracture is present. Soft tissues and spinal canal: No prevertebral fluid or swelling. No visible canal hematoma. Disc levels: The lung apices are clear. Uncovertebral spurring leads to moderate foraminal stenosis bilaterally at C4-5 and C6-7. Upper chest: The lung apices are clear. The thoracic inlet is within normal limits. IMPRESSION: 1. No acute fracture or traumatic subluxation. 2. Stable degenerative changes of the cervical spine. 3. Moderate foraminal stenosis bilaterally at C4-5 and C6-7. Electronically Signed   By: Lonni Necessary M.D.   On: 08/16/2023 15:39   CT Head Wo Contrast Result Date: 08/16/2023 CLINICAL DATA:  Mechanical fall yesterday.  Toe fracture. EXAM: CT HEAD WITHOUT CONTRAST TECHNIQUE: Contiguous axial images were obtained from the base of the skull through the vertex without intravenous contrast. RADIATION DOSE REDUCTION: This exam was performed according to the departmental dose-optimization program which includes automated exposure control, adjustment of the mA and/or kV according to patient size and/or use of iterative reconstruction technique. COMPARISON:  CT head without contrast 01/19/2021 FINDINGS: Brain: Mild periventricular and subcortical white matter hypoattenuation is similar the prior exam. No acute infarct, hemorrhage,  or mass lesion is present. Deep brain nuclei are within normal limits. The ventricles are of normal size. No significant extraaxial fluid collection is present. The brainstem and cerebellum are within normal limits. Midline structures are within normal limits. Vascular: No hyperdense vessel or unexpected calcification. Skull: Calvarium is intact. No focal lytic or blastic lesions are present. Minimal soft tissue swelling is noted along the lateral aspect of the right orbit. No  underlying fracture or foreign body is present. The extracranial soft tissues are otherwise within normal limits. Sinuses/Orbits: Bilateral mastoid effusions are present, right greater than left. No obstructing nasopharyngeal lesion is present. The paranasal sinuses are otherwise clear. The globes and orbits are within normal limits. IMPRESSION: 1. No acute intracranial abnormality or significant interval change. 2. Minimal soft tissue swelling along the lateral aspect of the right orbit without underlying fracture or foreign body. 3. Bilateral mastoid effusions, right greater than left. No obstructing nasopharyngeal lesion is present. Electronically Signed   By: Lonni Necessary M.D.   On: 08/16/2023 15:35   DG Chest Port 1 View Result Date: 08/16/2023 CLINICAL DATA:  Fall. EXAM: PORTABLE CHEST 1 VIEW COMPARISON:  Chest radiograph dated January 21, 2021 FINDINGS: The heart size and mediastinal contours are within normal limits. Aortic atherosclerosis. No focal consolidation, pleural effusion, or pneumothorax. Diffusely decreased osseous mineralization. Suspected compression deformity of the T11 vertebral body is faintly visualized on this exam and appears new since the prior exam dated January 21, 2021. No obvious displaced rib fracture identified. IMPRESSION: 1. Suspected age-indeterminate compression deformity of the T11 vertebral body is faintly visualized and appears new since the prior exam dated January 21, 2021. Recommend correlation with physical exam/point tenderness. Consider further evaluation with CT thoracic spine. 2. No acute cardiopulmonary findings. Electronically Signed   By: Harrietta Sherry M.D.   On: 08/16/2023 14:17   DG Hip Unilat W or Wo Pelvis 2-3 Views Left Result Date: 08/16/2023 CLINICAL DATA:  Status post fall with inability to bear weight on the left leg EXAM: DG HIP (WITH OR WITHOUT PELVIS) 3V LEFT COMPARISON:  Left hip radiographs dated 02/23/2021 FINDINGS: Left hip arthroplasty.  Hardware appears intact and well seated. No abnormal surrounding lucency. Subcapital right femoral fracture with shortened appearance of the proximal right femur. No evidence of hip dislocation. IMPRESSION: 1. Subcapital RIGHT femoral fracture with shortened appearance of the proximal right femur. 2. Left hip arthroplasty without evidence of hardware complication. Electronically Signed   By: Limin  Xu M.D.   On: 08/16/2023 13:18   DG Foot Complete Left Result Date: 08/16/2023 CLINICAL DATA:  Fall.  Unable to bear weight. EXAM: LEFT ANKLE COMPLETE - 3+ VIEW; LEFT FOOT - COMPLETE 3+ VIEW COMPARISON:  None Available. FINDINGS: There is diffuse osteopenia of the visualized osseous structures. There is mildly displaced fracture of the proximal portion of the proximal phalanx of fifth toe. No other acute fracture or dislocation. No aggressive osseous lesion. Ankle mortise appears intact. No focal soft tissue swelling. No radiopaque foreign bodies. IMPRESSION: *Mildly displaced fracture of the proximal portion of the proximal phalanx of fifth toe. Electronically Signed   By: Ree Molt M.D.   On: 08/16/2023 13:12   DG Ankle Complete Left Result Date: 08/16/2023 CLINICAL DATA:  Fall.  Unable to bear weight. EXAM: LEFT ANKLE COMPLETE - 3+ VIEW; LEFT FOOT - COMPLETE 3+ VIEW COMPARISON:  None Available. FINDINGS: There is diffuse osteopenia of the visualized osseous structures. There is mildly displaced fracture of the proximal portion of the  proximal phalanx of fifth toe. No other acute fracture or dislocation. No aggressive osseous lesion. Ankle mortise appears intact. No focal soft tissue swelling. No radiopaque foreign bodies. IMPRESSION: *Mildly displaced fracture of the proximal portion of the proximal phalanx of fifth toe. Electronically Signed   By: Ree Molt M.D.   On: 08/16/2023 13:12   (Echo, Carotid, EGD, Colonoscopy, ERCP)    Subjective: No complaints  Discharge Exam: Vitals:   08/24/23  0507 08/24/23 0820  BP: 107/75 99/62  Pulse: 67 84  Resp: 16   Temp: 97.6 F (36.4 C) 97.7 F (36.5 C)  SpO2: 99% 100%   Vitals:   08/23/23 1414 08/23/23 2001 08/24/23 0507 08/24/23 0820  BP: 105/65 111/69 107/75 99/62  Pulse: 68 73 67 84  Resp: 17 18 16    Temp: 97.7 F (36.5 C) 97.9 F (36.6 C) 97.6 F (36.4 C) 97.7 F (36.5 C)  TempSrc: Axillary  Axillary   SpO2: 98% 97% 99% 100%    General: Pt is alert, awake, not in acute distress Cardiovascular: RRR, S1/S2 +, no rubs, no gallops Respiratory: CTA bilaterally, no wheezing, no rhonchi Abdominal: Soft, NT, ND, bowel sounds + Extremities: no edema, no cyanosis    The results of significant diagnostics from this hospitalization (including imaging, microbiology, ancillary and laboratory) are listed below for reference.     Microbiology: Recent Results (from the past 240 hours)  Surgical pcr screen     Status: None   Collection Time: 08/17/23  1:29 AM   Specimen: Nasal Mucosa; Nasal Swab  Result Value Ref Range Status   MRSA, PCR NEGATIVE NEGATIVE Final   Staphylococcus aureus NEGATIVE NEGATIVE Final    Comment: (NOTE) The Xpert SA Assay (FDA approved for NASAL specimens in patients 44 years of age and older), is one component of a comprehensive surveillance program. It is not intended to diagnose infection nor to guide or monitor treatment. Performed at [facility], [address] [place], [place] [address]      Labs: BNP (last 3 results) No results for input(s): BNP in the last 8760 hours. Basic Metabolic Panel: Recent Labs  Lab 08/18/23 1648 08/19/23 0534 08/20/23 0447  NA  --  133* 135  K  --  4.1 4.0  CL  --  104 106  CO2  --  23 22  GLUCOSE  --  122* 115*  BUN  --  13 14  CREATININE 0.64 0.77 0.63  CALCIUM  --  8.6* 8.5*   Liver Function Tests: No results for input(s): AST, ALT, ALKPHOS, BILITOT, PROT, ALBUMIN  in the last 168 hours. No results for input(s): LIPASE,  AMYLASE in the last 168 hours. No results for input(s): AMMONIA in the last 168 hours. CBC: Recent Labs  Lab 08/18/23 1648 08/19/23 0534 08/20/23 0447  WBC 4.4 3.8* 3.5*  HGB 11.7* 11.1* 11.4*  HCT 34.2* 32.1* 32.7*  MCV 90.0 87.7 87.9  PLT 94* 101* 101*   Cardiac Enzymes: No results for input(s): CKTOTAL, CKMB, CKMBINDEX, TROPONINI in the last 168 hours. BNP: Invalid input(s): POCBNP CBG: No results for input(s): GLUCAP in the last 168 hours. D-Dimer No results for input(s): DDIMER in the last 72 hours. Hgb A1c No results for input(s): HGBA1C in the last 72 hours. Lipid Profile No results for input(s): CHOL, HDL, LDLCALC, TRIG, CHOLHDL, LDLDIRECT in the last 72 hours. Thyroid function studies No results for input(s): TSH, T4TOTAL, T3FREE, THYROIDAB in the last 72 hours.  Invalid input(s): FREET3 Anemia  work up No results for input(s): VITAMINB12, FOLATE, FERRITIN, TIBC, IRON, RETICCTPCT in the last 72 hours. Urinalysis    Component Value Date/Time   COLORURINE YELLOW 01/19/2021 1001   APPEARANCEUR CLEAR 01/19/2021 1001   LABSPEC 1.013 01/19/2021 1001   PHURINE 6.0 01/19/2021 1001   GLUCOSEU NEGATIVE 01/19/2021 1001   HGBUR NEGATIVE 01/19/2021 1001   HGBUR negative 02/01/2008 1038   BILIRUBINUR NEGATIVE 01/19/2021 1001   KETONESUR NEGATIVE 01/19/2021 1001   PROTEINUR NEGATIVE 01/19/2021 1001   UROBILINOGEN 1.0 08/14/2008 0851   NITRITE NEGATIVE 01/19/2021 1001   LEUKOCYTESUR TRACE (A) 01/19/2021 1001   Sepsis Labs Recent Labs  Lab 08/18/23 1648 08/19/23 0534 08/20/23 0447  WBC 4.4 3.8* 3.5*   Microbiology Recent Results (from the past 240 hours)  Surgical pcr screen     Status: None   Collection Time: 08/17/23  1:29 AM   Specimen: Nasal Mucosa; Nasal Swab  Result Value Ref Range Status   MRSA, PCR NEGATIVE NEGATIVE Final   Staphylococcus aureus NEGATIVE NEGATIVE Final    Comment: (NOTE) The Xpert  SA Assay (FDA approved for NASAL specimens in patients 34 years of age and older), is one component of a comprehensive surveillance program. It is not intended to diagnose infection nor to guide or monitor treatment. Performed at [facility], [address], [place], [place] [address]      Time coordinating discharge: Over 35 minutes  SIGNED:   Erle Odell Castor, MD  [facility] 08/24/2023, 12:02 PM Pager   If 7PM-7AM, please contact night-coverage [email or web address] Password [email or web address]

## 2023-08-24 NOTE — Progress Notes (Signed)
 [facility] Liaison Note  Current hospice patient Elizabeth Tucker revoked the Hospice Medicare Benefit to allow Medicare to pay for skilled rehab.   [employer] will follow at [facility] for outpatient palliative.  Please call with any questions.  Thank you, Randine Nail, BSN, [facility] [phone]

## 2023-08-24 NOTE — Plan of Care (Signed)
   Problem: Education: Goal: Knowledge of General Education information will improve Description: Including pain rating scale, medication(s)/side effects and non-pharmacologic comfort measures Outcome: Progressing   Problem: Health Behavior/Discharge Planning: Goal: Ability to manage health-related needs will improve Outcome: Progressing   Problem: Clinical Measurements: Goal: Will remain free from infection Outcome: Progressing

## 2023-09-06 ENCOUNTER — Encounter: Payer: Self-pay | Admitting: Orthopaedic Surgery

## 2023-09-06 ENCOUNTER — Other Ambulatory Visit (INDEPENDENT_AMBULATORY_CARE_PROVIDER_SITE_OTHER): Payer: Self-pay

## 2023-09-06 ENCOUNTER — Ambulatory Visit (INDEPENDENT_AMBULATORY_CARE_PROVIDER_SITE_OTHER): Payer: Medicare Other | Admitting: Orthopaedic Surgery

## 2023-09-06 DIAGNOSIS — M25551 Pain in right hip: Secondary | ICD-10-CM

## 2023-09-06 NOTE — Progress Notes (Signed)
 Post-Op Visit Note   Patient: Elizabeth Tucker           Date of Birth: February 05, 1946           MRN: [id number] Visit Date: 09/06/2023 PCP: No primary care provider on file.   Assessment & Plan:  Chief Complaint:  Chief Complaint  Patient presents with   Right Hip - Routine Post Op   Visit Diagnoses:  1. Pain in right hip     Plan: Patient is a pleasant 78 year old female who comes in today nearly 3 weeks status post right hip hemiarthroplasty from a femoral neck fracture, date of surgery 08/18/2023.  She has been doing okay.  She does have some discomfort to the right hip joint.  She has been at a skilled nursing facility working with physical therapy.  Ambulating in a wheelchair today but in therapy has been using a walker.  She was walking unassisted prior to this most recent fall.  Examination of the right hip: Well-healing surgical incision with nylon sutures in place.  No evidence of infection or cellulitis.  Calves are soft nontender.  She is neurovascularly intact distally.  Today, sutures were removed and Steri-Strips applied.  She will continue with physical therapy.  Follow-up in 3 weeks for repeat evaluation and AP pelvis x-rays.  Call with concerns or questions.  Follow-Up Instructions: Return in about 3 weeks (around 09/27/2023).   Orders:  Orders Placed This Encounter  Procedures   XR HIP UNILAT W OR W/O PELVIS 2-3 VIEWS RIGHT   No orders of the defined types were placed in this encounter.   Imaging: XR HIP UNILAT W OR W/O PELVIS 2-3 VIEWS RIGHT Result Date: 09/06/2023 Well-seated prosthesis without complication   PMFS History: Patient Active Problem List   Diagnosis Date Noted   Closed fracture of fifth toe of left foot 08/18/2023   Fall at home, initial encounter 08/16/2023   Fall 08/16/2023   Closed subcapital fracture of neck of femur, right, initial encounter (HCC) 08/16/2023   Closed left hip fracture, initial encounter (HCC) 01/19/2021   Dementia with  behavioral disturbance (HCC) 01/19/2021   Class 2 obesity due to excess calories with body mass index (BMI) of 36.0 to 36.9 in adult 01/19/2021   Calculus of gallbladder 12/05/2009   DEPRESSION 11/10/2009   WEIGHT GAIN, ABNORMAL 11/10/2009   Chronic hepatitis C virus infection (HCC) 02/14/2008   ANA POSITIVE 02/14/2008   ANKLE EDEMA 02/01/2008   PETECHIAE 02/01/2008   HYPOGLYCEMIA 05/11/2007   MOURNING 05/11/2007   INSOMNIA 05/11/2007   Internal hemorrhoids 11/01/1999   Past Medical History:  Diagnosis Date   Alzheimer disease (HCC)    Complication of anesthesia    unknown, pt has a hx of dementia   GERD (gastroesophageal reflux disease)    Hepatitis    Hepatitis C     Family History  Problem Relation Age of Onset   Dementia Mother    Liver disease Brother     Past Surgical History:  Procedure Laterality Date   CESAREAN SECTION     ELBOW SURGERY     TOTAL HIP ARTHROPLASTY Left 01/19/2021   Procedure: TOTAL HIP ARTHROPLASTY ANTERIOR APPROACH;  Surgeon: Arnie Lao, MD;  Location: [facility] OR;  Service: Orthopedics;  Laterality: Left;   TOTAL HIP ARTHROPLASTY Right 08/18/2023   Procedure: PARTIAL HIP ARTHROPLASTY ANTERIOR APPROACH;  Surgeon: Wes Hamman, MD;  Location: [facility] OR;  Service: Orthopedics;  Laterality: Right;   Social History   Occupational  History   Not on file  Tobacco Use   Smoking status: Never   Smokeless tobacco: Never  Vaping Use   Vaping status: Never Used  Substance and Sexual Activity   Alcohol  use: No   Drug use: No   Sexual activity: Not on file

## 2023-09-12 ENCOUNTER — Encounter: Payer: Self-pay | Admitting: Physician Assistant

## 2023-09-12 ENCOUNTER — Ambulatory Visit (INDEPENDENT_AMBULATORY_CARE_PROVIDER_SITE_OTHER): Payer: Medicare Other | Admitting: Physician Assistant

## 2023-09-12 ENCOUNTER — Other Ambulatory Visit (INDEPENDENT_AMBULATORY_CARE_PROVIDER_SITE_OTHER): Payer: Self-pay

## 2023-09-12 DIAGNOSIS — S52502A Unspecified fracture of the lower end of left radius, initial encounter for closed fracture: Secondary | ICD-10-CM | POA: Diagnosis not present

## 2023-09-12 NOTE — Progress Notes (Signed)
Office Visit Note   Patient: Elizabeth Tucker           Date of Birth: [birth year]           MRN: [id number] Visit Date: 09/12/2023              Requested by: No referring provider defined for this encounter. PCP: Joya Martyr, MD   Assessment & Plan: Visit Diagnoses:  1. Closed fracture of distal end of left radius, unspecified fracture morphology, initial encounter     Plan: Impression is left wrist displaced distal radius fracture.  Based on the patient's age and comorbidities, we will treat this nonoperatively.  We have placed her in a short arm cast.  She may be platform weightbearing to the left upper extremity.  Elevate for pain and swelling.  Follow-up in 2 weeks for repeat evaluation and x-rays of the left wrist.  Call with concerns or questions.  Follow-Up Instructions: Return in about 2 weeks (around 09/26/2023).   Orders:  Orders Placed This Encounter  Procedures   XR Wrist Complete Left   No orders of the defined types were placed in this encounter.     Procedures: No procedures performed   Clinical Data: No additional findings.   Subjective: Chief Complaint  Patient presents with   Left Wrist - Injury    DOI 09/03/2023    HPI patient is a very pleasant 78 year old female who comes in today with concerns about her left wrist.  Evidently, she sustained a fall on 09/03/2023.  I was not aware of this when I saw her for her hip on 09/06/2023.  She is not complaining of pain today.  She has been primarily ambulating in a wheelchair but it has been ambulating at times with a walker when she is working with PT currently residing at [facility].  Review of Systems as detailed in HPI.  All others reviewed and are negative.   Objective: Vital Signs: There were no vitals taken for this visit.  Physical Exam well-developed well-nourished female in no acute distress.  Alert and oriented x 3.  Ortho Exam left wrist exam shows no obvious deformity.   She has ecchymosis to the volar aspect.  She has tenderness throughout the distal radius.  She is able to wiggle all 5 fingers.  She is neurovascularly intact distally.  Specialty Comments:  No specialty comments available.  Imaging: XR Wrist Complete Left Result Date: 09/12/2023 Displaced distal radius fracture    PMFS History: Patient Active Problem List   Diagnosis Date Noted   Closed fracture of fifth toe of left foot 08/18/2023   Fall at home, initial encounter 08/16/2023   Fall 08/16/2023   Closed subcapital fracture of neck of femur, right, initial encounter (HCC) 08/16/2023   Closed left hip fracture, initial encounter (HCC) 01/19/2021   Dementia with behavioral disturbance (HCC) 01/19/2021   Class 2 obesity due to excess calories with body mass index (BMI) of 36.0 to 36.9 in adult 01/19/2021   Calculus of gallbladder 12/05/2009   DEPRESSION 11/10/2009   WEIGHT GAIN, ABNORMAL 11/10/2009   Chronic hepatitis C virus infection (HCC) 02/14/2008   ANA POSITIVE 02/14/2008   ANKLE EDEMA 02/01/2008   PETECHIAE 02/01/2008   HYPOGLYCEMIA 05/11/2007   MOURNING 05/11/2007   INSOMNIA 05/11/2007   Internal hemorrhoids 11/01/1999   Past Medical History:  Diagnosis Date   Alzheimer disease (HCC)    Complication of anesthesia    unknown, pt has a hx of  dementia   GERD (gastroesophageal reflux disease)    Hepatitis    Hepatitis C     Family History  Problem Relation Age of Onset   Dementia Mother    Liver disease Brother     Past Surgical History:  Procedure Laterality Date   CESAREAN SECTION     ELBOW SURGERY     TOTAL HIP ARTHROPLASTY Left 01/19/2021   Procedure: TOTAL HIP ARTHROPLASTY ANTERIOR APPROACH;  Surgeon: Kathryne Hitch, MD;  Location: [facility];  Service: Orthopedics;  Laterality: Left;   TOTAL HIP ARTHROPLASTY Right 08/18/2023   Procedure: PARTIAL HIP ARTHROPLASTY ANTERIOR APPROACH;  Surgeon: Tarry Kos, MD;  Location: [facility] OR;  Service: Orthopedics;   Laterality: Right;   Social History   Occupational History   Not on file  Tobacco Use   Smoking status: Never   Smokeless tobacco: Never  Vaping Use   Vaping status: Never Used  Substance and Sexual Activity   Alcohol use: No   Drug use: No   Sexual activity: Not on file

## 2023-09-18 ENCOUNTER — Ambulatory Visit: Admitting: Physician Assistant

## 2023-09-21 ENCOUNTER — Telehealth: Payer: Self-pay | Admitting: Physician Assistant

## 2023-09-21 NOTE — Telephone Encounter (Signed)
Jahci from [facility] called. Patient took her cast off. Would like to know what to do?  [phone]

## 2023-09-21 NOTE — Telephone Encounter (Signed)
Called facility. The earliest they can bring her is tomorrow. She is scheduled for tomorrow at 10am. They are going to see if they can find a brace to put on her in the meantime.

## 2023-09-22 ENCOUNTER — Other Ambulatory Visit (INDEPENDENT_AMBULATORY_CARE_PROVIDER_SITE_OTHER): Payer: PRIVATE HEALTH INSURANCE

## 2023-09-22 ENCOUNTER — Other Ambulatory Visit: Payer: Self-pay

## 2023-09-22 ENCOUNTER — Ambulatory Visit (INDEPENDENT_AMBULATORY_CARE_PROVIDER_SITE_OTHER): Payer: Medicare Other | Admitting: Orthopaedic Surgery

## 2023-09-22 DIAGNOSIS — S52502A Unspecified fracture of the lower end of left radius, initial encounter for closed fracture: Secondary | ICD-10-CM

## 2023-09-22 DIAGNOSIS — M25551 Pain in right hip: Secondary | ICD-10-CM

## 2023-09-22 NOTE — Progress Notes (Addendum)
Office Visit Note   Patient: Elizabeth Tucker           Date of Birth: [birth year]           MRN: [id number] Visit Date: 09/22/2023              Requested by: Joya Martyr, MD [address] [place],  [place] [address] PCP: Joya Martyr, MD   Assessment & Plan: Visit Diagnoses:  1. Closed fracture of distal end of left radius, unspecified fracture morphology, initial encounter   2. Pain in right hip     Plan: Impression is 5-week status post right hip hemiarthroplasty and 3 weeks status post left distal radius fracture.  In regards to the right hip, she is doing well.  Continue with PT.  Follow-up in 5 weeks for repeat evaluation and AP pelvis x-rays.  In regards to the left wrist, we will continue to treat this nonoperatively.  We will put her back into a short arm cast for another 4 to 5 weeks.  She will follow-up with [place] at that point for repeat evaluation and three-view x-rays of the left wrist.  Call with concerns or questions.  Follow-Up Instructions: Return in about 5 weeks (around 10/27/2023).   Orders:  Orders Placed This Encounter  Procedures   XR Wrist Complete Right   XR HIP UNILAT W OR W/O PELVIS 2-3 VIEWS RIGHT   XR Wrist Complete Left   No orders of the defined types were placed in this encounter.     Procedures: No procedures performed   Clinical Data: No additional findings.   Subjective: Chief Complaint  Patient presents with   Left Wrist - Pain, Follow-up    HPI patient is a 78 year old pleasantly demented female who is here today from SNF.  She is approximately 5 weeks status post right hip hemiarthroplasty 08/18/2023 and 3 weeks status post left wrist fracture 09/03/2023.  In regards to the right hip, she appears to be doing okay and is not complaining of pain.  It sounds like she has been getting physical therapy where she is platform weightbearing with a walker.  In regards to the left wrist, we elected to treat this  nonoperatively due to age and comorbidities.  They tell me that she removed her own cast at the facility last week.  She is not complaining of any pain to the left wrist today.  Review of Systems as detailed in HPI.  All others reviewed and are negative.   Objective: Vital Signs: There were no vitals taken for this visit.  Physical Exam well-nourished female.  Ortho Exam right hip exam: She does not exhibit any pain when I am passively rotating her hip.  Left wrist: No tenderness throughout the wrist.  She is neurovascularly intact distally.  Specialty Comments:  No specialty comments available.  Imaging: No results found.    PMFS History: Patient Active Problem List   Diagnosis Date Noted   Closed fracture of fifth toe of left foot 08/18/2023   Fall at home, initial encounter 08/16/2023   Fall 08/16/2023   Closed subcapital fracture of neck of femur, right, initial encounter (HCC) 08/16/2023   Closed left hip fracture, initial encounter (HCC) 01/19/2021   Dementia with behavioral disturbance (HCC) 01/19/2021   Class 2 obesity due to excess calories with body mass index (BMI) of 36.0 to 36.9 in adult 01/19/2021   Calculus of gallbladder 12/05/2009   DEPRESSION 11/10/2009   WEIGHT GAIN,  ABNORMAL 11/10/2009   Chronic hepatitis C virus infection (HCC) 02/14/2008   ANA POSITIVE 02/14/2008   ANKLE EDEMA 02/01/2008   PETECHIAE 02/01/2008   HYPOGLYCEMIA 05/11/2007   MOURNING 05/11/2007   INSOMNIA 05/11/2007   Internal hemorrhoids 11/01/1999   Past Medical History:  Diagnosis Date   Alzheimer disease (HCC)    Complication of anesthesia    unknown, pt has a hx of dementia   GERD (gastroesophageal reflux disease)    Hepatitis    Hepatitis C    SunDown syndrome     Family History  Problem Relation Age of Onset   Dementia Mother    Liver disease Brother     Past Surgical History:  Procedure Laterality Date   CESAREAN SECTION     ELBOW SURGERY     TOTAL HIP  ARTHROPLASTY Left 01/19/2021   Procedure: TOTAL HIP ARTHROPLASTY ANTERIOR APPROACH;  Surgeon: Kathryne Hitch, MD;  Location: [facility];  Service: Orthopedics;  Laterality: Left;   TOTAL HIP ARTHROPLASTY Right 08/18/2023   Procedure: PARTIAL HIP ARTHROPLASTY ANTERIOR APPROACH;  Surgeon: Tarry Kos, MD;  Location: [facility];  Service: Orthopedics;  Laterality: Right;   Social History   Occupational History   Not on file  Tobacco Use   Smoking status: Never   Smokeless tobacco: Never  Vaping Use   Vaping status: Never Used  Substance and Sexual Activity   Alcohol use: No   Drug use: No   Sexual activity: Not Currently

## 2023-10-08 ENCOUNTER — Emergency Department (HOSPITAL_COMMUNITY)
Admission: EM | Admit: 2023-10-08 | Discharge: 2023-10-09 | Disposition: A | Discharge status: Skilled Nursing Facility | Payer: Medicare Other | Attending: Student | Admitting: Student

## 2023-10-08 DIAGNOSIS — X58XXXD Exposure to other specified factors, subsequent encounter: Secondary | ICD-10-CM | POA: Insufficient documentation

## 2023-10-08 DIAGNOSIS — G309 Alzheimer's disease, unspecified: Secondary | ICD-10-CM | POA: Insufficient documentation

## 2023-10-08 DIAGNOSIS — S52302D Unspecified fracture of shaft of left radius, subsequent encounter for closed fracture with routine healing: Secondary | ICD-10-CM | POA: Insufficient documentation

## 2023-10-08 HISTORY — DX: Delirium due to known physiological condition: F05

## 2023-10-09 ENCOUNTER — Emergency Department (HOSPITAL_COMMUNITY): Payer: Medicare Other

## 2023-10-09 ENCOUNTER — Encounter (HOSPITAL_COMMUNITY): Payer: Self-pay

## 2023-10-09 DIAGNOSIS — S52302D Unspecified fracture of shaft of left radius, subsequent encounter for closed fracture with routine healing: Secondary | ICD-10-CM | POA: Diagnosis not present

## 2023-10-09 NOTE — Discharge Instructions (Addendum)
Your left forearm remains in similar position to previous x-rays.  A splint was applied.  We are unable to apply casting in the emergency department.  You will need follow-up with orthopedics for a hard cast.  I recommend contacting orthopedics to schedule an appointment.

## 2023-10-09 NOTE — ED Provider Notes (Signed)
[place] EMERGENCY DEPARTMENT AT [facility] Provider Note   CSN: [id number] Arrival date & time: 10/08/23  2354     History  Chief Complaint  Patient presents with   Cast Replacement    Elizabeth Tucker is a 78 y.o. female.  Patient with past medical history significant for dementia with behavioral disturbance, left-sided radial fracture treated nonoperatively with short arm cast presents to the emergency department via EMS from [facility] health care due to patient removing the cast.  Patient has reportedly managed to remove casting 3 separate times from her left arm by picking and pulling at the material.  Skilled nursing center to the emergency department to have a cast reapplied.  Patient is reportedly at her mental baseline at this time.  She is nonverbal upon my examination.  No reported falls or new injuries.  HPI     Home Medications Prior to Admission medications   Medication Sig Start Date End Date Taking? Authorizing Provider  enoxaparin (LOVENOX) 40 MG/0.4ML injection Inject 0.4 mLs (40 mg total) into the skin daily for 14 days. 08/18/23 09/01/23  Tarry Kos, MD  guaifenesin (ROBITUSSIN) 100 MG/5ML syrup Take 200 mg by mouth every 4 (four) hours as needed for cough.    [provider]  haloperidol (HALDOL) 2 MG tablet Take 2 mg by mouth 2 (two) times daily.    [provider]  HYDROcodone-acetaminophen (NORCO) 5-325 MG tablet Take 1 tablet by mouth every 6 (six) hours as needed. 08/18/23   Tarry Kos, MD  miconazole (MICOTIN) 2 % powder Apply 1 Application topically in the morning and at bedtime. Prn order: 1 application every 8 hours as needed for rash  Apply to breast and umbilical folds    [provider]  polyethylene glycol (MIRALAX / GLYCOLAX) 17 g packet Take 17 g by mouth daily as needed for mild constipation. 08/24/23   Marinda Elk, MD  Propylene Glycol (SYSTANE BALANCE) 0.6 % SOLN Apply to eye.    [provider]  traZODone (DESYREL) 50 MG tablet Take 50 mg by mouth at bedtime.    [provider]  UNABLE TO FIND Take 1 Bottle by mouth in the morning, at noon, and at bedtime. Med Name: mighty shake    [provider]  zinc oxide 20 % ointment Apply 1 Application topically See admin instructions. 1 application to buttocks and peri area every shift    [provider]      Allergies    Sulfonamide derivatives    Review of Systems   Review of Systems  Physical Exam Updated Vital Signs BP 109/76 (BP Location: Right Arm)   Pulse 80   Temp (!) 97.5 F (36.4 C) (Temporal)   Resp 16   Ht 5\' 5"  (1.651 m)   Wt 45.4 kg   SpO2 94%   BMI 16.64 kg/m  Physical Exam  ED Results / Procedures / Treatments   Labs (all labs ordered are listed, but only abnormal results are displayed) Labs Reviewed - No data to display  EKG None  Radiology DG Wrist Complete Left Result Date: 10/09/2023 CLINICAL DATA:  Known distal radial fracture.  Patient removed cast EXAM: LEFT WRIST - COMPLETE 3+ VIEW COMPARISON:  09/22/2023 FINDINGS: Distal left radial fracture again noted with significant posterior angulation, similar to prior study. No subluxation or dislocation. No ulnar fracture visualized. IMPRESSION: Stable distal left radial fracture with posterior angulation. Electronically Signed   By: Charlett Nose  M.D.   On: 10/09/2023 00:33    Procedures .Ortho Injury Treatment  Date/Time: 10/09/2023 1:07 AM  Performed by: Darrick Grinder, PA-C Authorized by: Darrick Grinder, PA-C  Injury location: forearm Location details: left forearm Injury type: fracture-dislocation Pre-procedure neurovascular assessment: neurovascularly intact Immobilization: splint Splint type: Short arm. Splint Applied by: Milon Dikes Post-procedure neurovascular assessment: post-procedure neurovascularly intact       Medications Ordered in ED Medications - No data to display  ED Course/  Medical Decision Making/ A&P                                 Medical Decision Making Amount and/or Complexity of Data Reviewed Radiology: ordered.   This patient presents to the ED for concern of left radius fracture, this involves an extensive number of treatment options, and is a complaint that carries with it a high risk of complications and morbidity.     Co morbidities that complicate the patient evaluation  Advanced dementia   Additional history obtained:  Additional history obtained from EMS External records from outside source obtained and reviewed including orthopedic notes  Imaging Studies ordered:  I ordered imaging studies including plain films of the left wrist I independently visualized and interpreted imaging which showed  Stable distal left radial fracture with posterior angulation.      I agree with the radiologist interpretation   Social Determinants of Health:  Patient with dementia, currently coming from skilled nursing facility   Test / Admission - Considered:  We are unable to apply casting in the emergency department but a new splint was applied to the patient stable left distal radius fracture.  No indication for further emergent workup patient stable for discharge to facility with recommended follow-up with orthopedics.         Final Clinical Impression(s) / ED Diagnoses Final diagnoses:  Closed fracture of shaft of left radius with routine healing, unspecified fracture morphology, subsequent encounter    Rx / DC Orders ED Discharge Orders     None         Pamala Duffel 10/09/23 0111    Glendora Score, MD 10/09/23 2112

## 2023-10-09 NOTE — Progress Notes (Signed)
Orthopedic Tech Progress Note Patient Details:  Elizabeth Tucker October 20, 1945 [id number]  Ortho Devices Type of Ortho Device: Sugartong splint, Sling and swathe Ortho Device/Splint Location: lue Ortho Device/Splint Interventions: Ordered, Application, Adjustment  I spoke with the dr before seeing the patient. I asked if they wanted a sugartong or another cast. They said a sugartong would be ok as the patient is going to see ortho. I suggested a sling and swathe to help to keep the splint on better than a sling immobilizer. They said ok. I went to apply the splint. The patient was moving a lot during splint application. I had the rn and tech help sit up the patient so I could get the sling and swathe on.  Post Interventions Patient Tolerated: Well Instructions Provided: Adjustment of device, Care of device  Trinna Post 10/09/2023, 2:34 AM

## 2023-10-09 NOTE — ED Triage Notes (Signed)
Pt arrived from [facility] for removing a hard cast off her left wrist/arm, this is the 3rd time pt has removed her casting from her arm, Pt has a left radial fx from 1/21, was seen by orthopedics. SNF sent past to ED to have another cast applied to pt's left arm. Pt has hx of alzheimer disease and is alter at baseline, also hx of sundowners.   EMS vitals 79 HR 121/66 94% RA 16 RR

## 2023-10-13 ENCOUNTER — Ambulatory Visit: Payer: Medicare Other | Admitting: Orthopaedic Surgery

## 2023-10-19 ENCOUNTER — Ambulatory Visit (INDEPENDENT_AMBULATORY_CARE_PROVIDER_SITE_OTHER): Payer: Medicare Other | Admitting: Orthopaedic Surgery

## 2023-10-19 DIAGNOSIS — S52502A Unspecified fracture of the lower end of left radius, initial encounter for closed fracture: Secondary | ICD-10-CM | POA: Diagnosis not present

## 2023-10-19 NOTE — Progress Notes (Signed)
 Office Visit Note   Patient: Elizabeth Tucker           Date of Birth: October 04, 1945           MRN: [id number] Visit Date: 10/19/2023              Requested by:  Joya Martyr, MD [address] [place],  [place] [address]  PCP: Joya Martyr, MD   Assessment & Plan: Visit Diagnoses:  1. Closed fracture of distal end of left radius, unspecified fracture morphology, initial encounter     Plan: Patient is now 6 weeks status post a left distal radius fracture.  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  Fracture has healed radiographically and clinically.  If she is unable to tolerate cast or splint and has removed these numerous times.  Will place her in a Velcro wrist splint which she can wean as tolerated.  I have ordered OT for the wrist at her facility.  Will see her back as needed.  Follow-Up Instructions: No follow-ups on file.   Orders:  No orders of the defined types were placed in this encounter.  No orders of the defined types were placed in this encounter.     Procedures: No procedures performed   Clinical Data: No additional findings.   Subjective: Chief Complaint  Patient presents with   Left Wrist - Pain    HPI Elizabeth Tucker returns today for follow-up evaluation of left distal radius fracture.  She is with her caretaker today.  Elizabeth Tucker has advanced dementia and is essentially nonverbal. Review of Systems  Unable to perform ROS: Dementia     Objective: Vital Signs: There were no vitals taken for this visit.  Physical Exam Vitals and nursing note reviewed.  Constitutional:      Appearance: She is well-developed.  HENT:     Head: Normocephalic and atraumatic.  Pulmonary:     Effort: Pulmonary effort is normal.  Abdominal:     Palpations: Abdomen is soft.  Musculoskeletal:     Cervical back: Neck supple.  Skin:    General: Skin is warm.     Capillary Refill: Capillary refill takes less than 2 seconds.  Neurological:     Mental  Status: She is alert and oriented to person, place, and time.  Psychiatric:        Behavior: Behavior normal.        Thought Content: Thought content normal.        Judgment: Judgment normal.     Ortho Exam Examination of the left wrist shows expected decreased range of motion.  There is no gross motion through the fracture site.  No neurovascular compromise. Specialty Comments:  No specialty comments available.  Imaging: No results found.   PMFS History: Patient Active Problem List   Diagnosis Date Noted   Closed fracture of fifth toe of left foot 08/18/2023   Fall at home, initial encounter 08/16/2023   Fall 08/16/2023   Closed subcapital fracture of neck of femur, right, initial encounter (HCC) 08/16/2023   Closed left hip fracture, initial encounter (HCC) 01/19/2021   Dementia with behavioral disturbance (HCC) 01/19/2021   Class 2 obesity due to excess calories with body mass index (BMI) of 36.0 to 36.9 in adult 01/19/2021   Calculus of gallbladder 12/05/2009   DEPRESSION 11/10/2009   WEIGHT GAIN, ABNORMAL 11/10/2009   Chronic hepatitis C virus infection (HCC) 02/14/2008   ANA POSITIVE 02/14/2008   ANKLE EDEMA 02/01/2008  PETECHIAE 02/01/2008   HYPOGLYCEMIA 05/11/2007   MOURNING 05/11/2007   INSOMNIA 05/11/2007   Internal hemorrhoids 11/01/1999   Past Medical History:  Diagnosis Date   Alzheimer disease (HCC)    Complication of anesthesia    unknown, pt has a hx of dementia   GERD (gastroesophageal reflux disease)    Hepatitis    Hepatitis C    SunDown syndrome     Family History  Problem Relation Age of Onset   Dementia Mother    Liver disease Brother     Past Surgical History:  Procedure Laterality Date   CESAREAN SECTION     ELBOW SURGERY     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

## 2023-10-20 ENCOUNTER — Telehealth: Payer: Self-pay

## 2023-10-20 NOTE — Telephone Encounter (Signed)
 wbat

## 2023-10-20 NOTE — Telephone Encounter (Signed)
 Dipam with [facility] would like to know patient WB status.  CB# [phone].  Please advise.  Thank you

## 2023-10-20 NOTE — Telephone Encounter (Signed)
 Called and notified Dipam.

## 2024-02-20 DEATH — deceased
